# Patient Record
Sex: Female | Born: 1940 | Race: White | Hispanic: No | Marital: Married
Health system: Midwestern US, Academic
[De-identification: ages and names within clinical notes are randomized; demographics above are authoritative.]

---

## 2016-11-23 MED ORDER — ATORVASTATIN 40 MG PO TAB
20 mg | ORAL_TABLET | Freq: Every day | ORAL | 11 refills | Status: DC
Start: 2016-11-23 — End: 2016-11-30

## 2016-11-30 MED ORDER — ATORVASTATIN 20 MG PO TAB
20 mg | ORAL_TABLET | Freq: Every day | ORAL | 3 refills | Status: DC
Start: 2016-11-30 — End: 2017-06-07

## 2016-12-18 MED ORDER — LOSARTAN 25 MG PO TAB
ORAL_TABLET | Freq: Every day | ORAL | 3 refills | 30.00000 days | Status: DC
Start: 2016-12-18 — End: 2017-08-06

## 2017-02-07 ENCOUNTER — Encounter: Admit: 2017-02-07 | Discharge: 2017-02-07 | Payer: MEDICARE

## 2017-02-07 MED ORDER — POTASSIUM CHLORIDE 20 MEQ PO TBTQ
ORAL_TABLET | Freq: Every day | 3 refills | 30.00000 days | Status: AC
Start: 2017-02-07 — End: 2017-10-26

## 2017-05-15 ENCOUNTER — Encounter: Admit: 2017-05-15 | Discharge: 2017-05-15 | Payer: MEDICARE

## 2017-05-15 MED ORDER — METOPROLOL SUCCINATE 25 MG PO TB24
ORAL_TABLET | Freq: Every day | ORAL | 3 refills | 90.00000 days | Status: AC
Start: 2017-05-15 — End: 2017-05-28

## 2017-05-15 MED ORDER — FUROSEMIDE 40 MG PO TAB
ORAL_TABLET | Freq: Every day | ORAL | 3 refills | 90.00000 days | Status: AC
Start: 2017-05-15 — End: 2017-05-28

## 2017-05-25 ENCOUNTER — Encounter: Admit: 2017-05-25 | Discharge: 2017-05-25 | Payer: MEDICARE

## 2017-05-28 MED ORDER — METOPROLOL SUCCINATE 25 MG PO TB24
ORAL_TABLET | Freq: Every day | ORAL | 2 refills | 90.00000 days | Status: AC
Start: 2017-05-28 — End: 2018-02-27

## 2017-05-28 MED ORDER — FUROSEMIDE 40 MG PO TAB
ORAL_TABLET | Freq: Every day | ORAL | 3 refills | 90.00000 days | Status: AC
Start: 2017-05-28 — End: 2018-06-03

## 2017-06-04 LAB — BASIC METABOLIC PANEL
Lab: 0.8 — ABNORMAL LOW (ref 33.0–37.0)
Lab: 10
Lab: 105
Lab: 105
Lab: 14
Lab: 142
Lab: 27
Lab: 30 — ABNORMAL HIGH (ref 9.8–20.1)

## 2017-06-04 LAB — HEMOGLOBIN A1C: Lab: 6.3

## 2017-06-04 LAB — CBC: Lab: 6.8

## 2017-06-07 ENCOUNTER — Ambulatory Visit: Admit: 2017-06-07 | Discharge: 2017-06-08 | Payer: MEDICARE

## 2017-06-07 ENCOUNTER — Encounter: Admit: 2017-06-07 | Discharge: 2017-06-07 | Payer: MEDICARE

## 2017-06-07 DIAGNOSIS — E119 Type 2 diabetes mellitus without complications: ICD-10-CM

## 2017-06-07 DIAGNOSIS — I2510 Atherosclerotic heart disease of native coronary artery without angina pectoris: Principal | ICD-10-CM

## 2017-06-07 DIAGNOSIS — M1990 Unspecified osteoarthritis, unspecified site: ICD-10-CM

## 2017-06-07 DIAGNOSIS — E7800 Pure hypercholesterolemia, unspecified: ICD-10-CM

## 2017-06-07 DIAGNOSIS — H547 Unspecified visual loss: ICD-10-CM

## 2017-06-07 DIAGNOSIS — I10 Essential (primary) hypertension: ICD-10-CM

## 2017-06-07 DIAGNOSIS — K439 Ventral hernia without obstruction or gangrene: ICD-10-CM

## 2017-06-07 DIAGNOSIS — M549 Dorsalgia, unspecified: ICD-10-CM

## 2017-06-07 DIAGNOSIS — E669 Obesity, unspecified: ICD-10-CM

## 2017-06-07 DIAGNOSIS — E785 Hyperlipidemia, unspecified: Secondary | ICD-10-CM

## 2017-06-07 DIAGNOSIS — J439 Emphysema, unspecified: ICD-10-CM

## 2017-06-07 DIAGNOSIS — J45909 Unspecified asthma, uncomplicated: ICD-10-CM

## 2017-06-07 MED ORDER — ATORVASTATIN 40 MG PO TAB
40 mg | ORAL_TABLET | Freq: Every day | ORAL | 3 refills | Status: AC
Start: 2017-06-07 — End: 2017-11-15

## 2017-06-07 NOTE — Progress Notes
Date of Service: 06/07/2017    Amy Bowman is a 76 y.o. female.       HPI     I had the pleasure of seeing Amy Bowman today for a 84-month evaluation of her coronary artery disease post CABG in 1999, hypertension, hyperlipidemia, morbid obesity, and diabetes mellitus type 2 requiring insulin.  She also has severe osteoarthritis and is post prior right shoulder surgery, bilateral hip surgery, and back surgery.  She needs to use a walker or scooter to ambulate due to her significant pain.  She followed up with Dr. Avie Arenas on November 23, 2016 and at that time was not having any cardiovascular complaints.  She had been successful in an 8 pound weight loss.    Amy Bowman presents to the office today telling me that in August she went to the ED due to constipation.  She tells me she did take MiraLAX for a while and her constipation resolved but it has returned.  She just restarted the MiraLAX 2 days ago and it has not yet helped.  She tells me her abdomen is very bloated and she is uncomfortable.  Otherwise she tells me she thinks she is been getting along well.  She has recently been started on oxygen at night.  She tells me now that she uses oxygen at night when she wakes up in the morning she feels refreshed.  She tells me she does snore some at night but does not believe she has any apneic periods and again reports that she wakes up feeling refreshed in the morning.  She does complain of some left upper inner leg pain and burning.  She has an appointment to address this with her PCP on October 22.  She reports occasionally feeling mildly lightheaded.  She does have mild left ankle edema that she reports is chronic ever since a vein was harvested in her left lower leg from her bypass surgery.    Labs drawn at her PCPs office on June 04, 2017 are noted below:  Results for Amy, Bowman (MRN [id number]) as of 06/07/2017 11:11   Ref. Range 06/04/2017 00:00   Hemoglobin Unknown 14.5   Hematocrit Unknown 46.1 Platelet Count Unknown 287   White Blood Cells Unknown 6.8   RBC Unknown 4.74   MCV Unknown 97.1   MCH Unknown 30.6   MCHC Latest Ref Range: 33.0 - 37.0  31.5 (L)   RDW Unknown 14.4   Sodium Unknown 142   Potassium Unknown 4.4   Chloride Unknown 105   CO2 Unknown 27.0   Anion Gap Unknown 14   Blood Urea Nitrogen Latest Ref Range: 9.8 - 20.1  30.0 (H)   Creatinine Unknown 0.85   Glucose Unknown 105   Calcium Unknown 10.0   Hemoglobin A1C Unknown 6.3     Assessment and plan    1.  Coronary artery disease???stable.  She denies having any chest pain.  She continues on aspirin, losartan, and metoprolol.  Her most recent regadenoson MPI from September 2016 revealed a normal EF at 74% and there was no significant ischemia on her study.  Her most recent echocardiogram from September 2016 revealed a normal EF, no valvular stenosis, mild biatrial enlargement, and mild LVH.    2.  Hypertension.  Her blood pressure was elevated at 144/80 and 146/80.  After sitting and resting for 10 minutes her blood pressure was essentially unchanged.  She tells me that she feels very uncomfortable from being constipated.  She has  been encouraged to use the MiraLAX daily and get her constipation resolved.  She will follow-up in our office in 1 week for blood pressure check with the nurse.  Hopefully her blood pressure will be back to normal.  If her blood pressure is greater than 140 systolic or 90 diastolic we will ask her to increase her losartan to 50 mg daily.  We would then check a BMP 3 weeks later and have her follow-up in our office in 1 month for a blood pressure check to make sure it is running well controlled.    3.  Hyperlipidemia. Her LDL is elevated at 94.  Her goal LDL is less than 70.  She is been asked to increase her atorvastatin to 40 mg daily.She has been given a lab order to check a fasting lipid profile, AST, and ALT in 3 months.  She will follow-up in our office with Dr. Avie Arenas 1 week later. 4.  Morbid obesity.  She tells me she is frustrated that she has regained 10 pounds in 6 months.  She tells me she really does not monitor what she eats.  We spent quite a bit of time discussing portion control and cutting back her calories with the goal of weight loss.  Unfortunately her activity is very limited by her significant osteoarthritis and pain.    Thank you for the opportunity to participate in the care of this patient.  Please feel free to call us if you have any questions or concerns    Loraine Leriche, APRN-C  DRB             Vitals:    06/07/17 0948 06/07/17 0959   BP: 144/80 146/80   Pulse: 74    Weight: 115.7 kg (255 lb)    Height: 1.524 m (5')      Body mass index is 49.8 kg/m???.     Past Medical History  Patient Active Problem List    Diagnosis Date Noted   ??? 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 Pre-operative cardiovascular examination 04/22/2015   ??? Preoperative cardiovascular examination 11/26/2014   ??? 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 Obesity 06/02/2009   ??? 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 DJD (degenerative joint disease) 06/02/2009   ??? 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 Abdominal wall hernia 06/02/2009   ??? CAD (coronary artery disease)      CABG 1999 at [facility]:  LIMA > LAD, SVG to OM,  SVG to inferior OM> PDA     ??? Type II diabetes mellitus (HCC)    ??? HLD (hyperlipidemia)    ??? HTN (hypertension)    ??? Near syncope      10/16/2013-[facility] Carotid Artery US-Impression:  Some minimal atherosclerotic plaque evident bilaterally in the carotid bulbs and origin of the internal carotid arteries that does not appear to be hemodynamically significant at this time with findings consistent with less than a 50% diameter stenosis left and right.           Review of Systems   Constitution: Negative.   HENT: Negative.    Eyes: Negative.    Cardiovascular: Negative. Respiratory: Positive for shortness of breath.    Endocrine: Negative.    Hematologic/Lymphatic: Negative.    Skin: Negative.    Musculoskeletal: Positive for muscle cramps and myalgias.   Gastrointestinal: Positive for constipation.  Genitourinary: Negative.    Neurological: Negative.    Psychiatric/Behavioral: Negative.    Allergic/Immunologic: Negative.    She denies having chest pain, palpitations, PND, orthopnea,  dizziness, pre-syncope, syncope, coughing, wheezing,  sputum production, hemoptysis, or edema. She uses no tobacco products.      Physical Exam     General Appearance: resting comfortably, no acute distress, morbidly obese  Skin: warm, moist, no ulcers or xanthomas  Digits and Nails: no clubbing  Eyes: conjunctivae and lids normal, pupils are equal and round  Lips & Oral Mucosa: no pallor or cyanosis  Ear, Nose, Throat: No deformities  Neck Veins: neck veins are flat, neck veins are not distended  Thyroid: no nodules, masses, tenderness or enlargement  Chest Inspection: chest is normal in appearance  Respiratory Effort: breathing is unlabored, no respiratory distress  Auscultation/Percussion: lungs clear to auscultation, no rales, rhonchi, or wheezing  PMI: PMI not enlarged or displaced  Cardiac Rhythm: regular rhythm and normal rate  Cardiac Auscultation: Normal S1 & S2, no S3 or S4, no rub  Murmurs: no cardiac murmurs   Carotid Arteries: normal carotid upstroke bilaterally, no bruits  Pedal Pulses: normal symmetric pedal pulses  Lower Extremity Edema: no lower extremity edema  Abdominal Exam: soft, non-tender, no masses, bowel sounds normal, difficult to assess due to obesity  Abdominal Aorta: difficult to assess due to obesity  Liver & Spleen: difficult to assess due to obesity  Gait & Station: uses a walker due to osteoarthritis, back and hip pain  Muscle Strength: normal strength and tone  Neurologic Exam: neurological assessment grossly intact  Orientation: oriented to time, place and person Affect & Mood: appropriate and sustained affect  Other: moves all extremities            Problems Addressed Today  No diagnosis found.                  Current Medications (including today's revisions)  ??? albuterol (VENTOLIN HFA, PROAIR HFA) 90 mcg/Actuation IN inhaler Inhale 2 Puffs by mouth Every 6 Hours as needed for Wheezing.   ??? aspirin EC 81 mg PO tablet Take 1 Tab by mouth Daily.   ??? atorvastatin (LIPITOR) 20 mg tablet Take 1 tablet by mouth daily.   ??? furosemide (LASIX) 40 mg tablet TAKE 1 TABLET BY MOUTH EVERY DAY   ??? insulin lispro(+) 75/25 (HUMALOG MIX 75/25) 100 unit/mL (75/25) SC Susp Inject  under the skin. Sliding scale   ??? losartan (COZAAR) 25 mg tablet TAKE 1 TABLET BY MOUTH EVERY DAY   ??? metformin (GLUCOPHAGE) 1,000 mg PO tablet Take 1 Tab by mouth Twice Daily With Meals. Resume on Friday evening. 48hrs post procedure   ??? metoprolol XL (TOPROL XL) 25 mg extended release tablet TAKE 1 TABLET BY MOUTH EVERY DAY   ??? naproxen (NAPROSYN) 500 mg PO tablet Take 1 Tab by mouth twice daily.   ??? potassium chloride SR (K-DUR) 20 mEq tablet TAKE 1 TABLET BY MOUTH EVERY DAY   ??? zafirlukast(+) (ACCOLATE) 20 mg PO tablet Take 1 Tab by mouth Twice Daily.

## 2017-06-08 ENCOUNTER — Encounter: Admit: 2017-06-08 | Discharge: 2017-06-08 | Payer: MEDICARE

## 2017-06-08 LAB — HEMOGLOBIN A1C: Lab: 6.3

## 2017-06-08 LAB — CBC
Lab: 14
Lab: 14
Lab: 287
Lab: 30
Lab: 31 — ABNORMAL LOW (ref 33–37)
Lab: 46
Lab: 6.8
Lab: 97

## 2017-06-08 LAB — BASIC METABOLIC PANEL
Lab: 0.8
Lab: 10
Lab: 105
Lab: 105
Lab: 14
Lab: 142
Lab: 27
Lab: 30 — ABNORMAL HIGH (ref 9.8–20.1)
Lab: 4.4

## 2017-06-08 NOTE — Telephone Encounter
Labs entered for review at next OV.

## 2017-06-14 ENCOUNTER — Encounter: Admit: 2017-06-14 | Discharge: 2017-06-14 | Payer: MEDICARE

## 2017-06-14 NOTE — Progress Notes
Patient presents to clinic today for blood pressure check since it was sl elevated at last office visit.  Today bp is L 150/70, R 153/80, pulse is 74.  Pt bought a new [employer] home blood pressure cuff and brought it today to compare.  The new cuff is reading within range of manual reading. Pt is sl hesitant about increasing medications because her bp was normal at Dr. Jadene Pierini office the other day.  I asked her to begin monitoring at home once daily and call if bp is consistently elevated.  I will call next week to obtain bp readings.   Will route to [facility] for review.

## 2017-06-26 ENCOUNTER — Encounter: Admit: 2017-06-26 | Discharge: 2017-06-26 | Payer: MEDICARE

## 2017-06-26 NOTE — Telephone Encounter
Pt called back and left message with her bp readings.  BP stable.     10/30  144/67  10/31  135/66  11/1    153/71  11/2    136/67  11/3    129/62  11/4    139/78  11/5    137/69

## 2017-08-06 ENCOUNTER — Encounter: Admit: 2017-08-06 | Discharge: 2017-08-06 | Payer: MEDICARE

## 2017-08-06 MED ORDER — LOSARTAN 50 MG PO TAB
50 mg | ORAL_TABLET | Freq: Every day | ORAL | 3 refills | 30.00000 days | Status: AC
Start: 2017-08-06 — End: 2017-09-19

## 2017-08-06 NOTE — Progress Notes
Received msg on triage line from pt asking for call back for losartan refill needed. Spoke to pt in f/u and she stated when she saw Collie Siad in Oct she was told to increas losartan if BP remained elevated. Chart review confirms. She spoke to [place], [place] on 11/6 with update on BP and then decided to increase the dose ~ 12/1. She states since that time BP has ranged from 141/73-115/58. Pt is planning to get f/u labs in [place] on 12/27 and will see Dr. Samantha Crimes [facility]) in f/u 1/3. She requests new refill for larger tablet size losartan. e-RX sent as requested. No further needs identified at this time.

## 2017-08-07 ENCOUNTER — Encounter: Admit: 2017-08-07 | Discharge: 2017-08-07 | Payer: MEDICARE

## 2017-08-16 LAB — LIPID PROFILE
Lab: 160
Lab: 33
Lab: 4
Lab: 80

## 2017-08-16 LAB — AST (SGOT): Lab: 53 — ABNORMAL HIGH (ref 5–34)

## 2017-08-16 LAB — ALT (SGPT): Lab: 71 — ABNORMAL HIGH (ref 0–55)

## 2017-08-17 ENCOUNTER — Encounter: Admit: 2017-08-17 | Discharge: 2017-08-17 | Payer: MEDICARE

## 2017-08-17 DIAGNOSIS — E7800 Pure hypercholesterolemia, unspecified: Principal | ICD-10-CM

## 2017-08-23 ENCOUNTER — Ambulatory Visit: Admit: 2017-08-23 | Discharge: 2017-08-24 | Payer: MEDICARE

## 2017-08-23 ENCOUNTER — Encounter: Admit: 2017-08-23 | Discharge: 2017-08-23 | Payer: MEDICARE

## 2017-08-23 DIAGNOSIS — J439 Emphysema, unspecified: ICD-10-CM

## 2017-08-23 DIAGNOSIS — M549 Dorsalgia, unspecified: ICD-10-CM

## 2017-08-23 DIAGNOSIS — J45909 Unspecified asthma, uncomplicated: ICD-10-CM

## 2017-08-23 DIAGNOSIS — E118 Type 2 diabetes mellitus with unspecified complications: ICD-10-CM

## 2017-08-23 DIAGNOSIS — M1990 Unspecified osteoarthritis, unspecified site: ICD-10-CM

## 2017-08-23 DIAGNOSIS — I10 Essential (primary) hypertension: ICD-10-CM

## 2017-08-23 DIAGNOSIS — H547 Unspecified visual loss: ICD-10-CM

## 2017-08-23 DIAGNOSIS — M19011 Primary osteoarthritis, right shoulder: ICD-10-CM

## 2017-08-23 DIAGNOSIS — E119 Type 2 diabetes mellitus without complications: ICD-10-CM

## 2017-08-23 DIAGNOSIS — R55 Syncope and collapse: ICD-10-CM

## 2017-08-23 DIAGNOSIS — Z951 Presence of aortocoronary bypass graft: ICD-10-CM

## 2017-08-23 DIAGNOSIS — I2510 Atherosclerotic heart disease of native coronary artery without angina pectoris: Principal | ICD-10-CM

## 2017-08-23 DIAGNOSIS — M8949 Other hypertrophic osteoarthropathy, multiple sites: ICD-10-CM

## 2017-08-23 DIAGNOSIS — K439 Ventral hernia without obstruction or gangrene: ICD-10-CM

## 2017-08-23 DIAGNOSIS — E785 Hyperlipidemia, unspecified: ICD-10-CM

## 2017-08-23 DIAGNOSIS — E7800 Pure hypercholesterolemia, unspecified: ICD-10-CM

## 2017-08-23 DIAGNOSIS — E669 Obesity, unspecified: ICD-10-CM

## 2017-09-18 ENCOUNTER — Encounter: Admit: 2017-09-18 | Discharge: 2017-09-18 | Payer: MEDICARE

## 2017-09-18 DIAGNOSIS — E7800 Pure hypercholesterolemia, unspecified: Principal | ICD-10-CM

## 2017-09-18 DIAGNOSIS — E118 Type 2 diabetes mellitus with unspecified complications: ICD-10-CM

## 2017-09-19 ENCOUNTER — Encounter: Admit: 2017-09-19 | Discharge: 2017-09-19 | Payer: MEDICARE

## 2017-09-19 DIAGNOSIS — I10 Essential (primary) hypertension: ICD-10-CM

## 2017-09-19 DIAGNOSIS — I2510 Atherosclerotic heart disease of native coronary artery without angina pectoris: Principal | ICD-10-CM

## 2017-09-19 MED ORDER — LOSARTAN 100 MG PO TAB
100 mg | ORAL_TABLET | Freq: Every day | ORAL | 3 refills | 30.00000 days | Status: AC
Start: 2017-09-19 — End: 2018-09-10

## 2017-10-15 LAB — BASIC METABOLIC PANEL
Lab: 0.8 mL/min (ref >60)
Lab: 104 U/L (ref 7–56)
Lab: 13
Lab: 139 U/L (ref 7–40)
Lab: 26 mL/min — ABNORMAL HIGH (ref 9.8–20.1)
Lab: 27 10*3/uL (ref 3–12)
Lab: 4.7 MMOL/L — ABNORMAL HIGH (ref 21–30)
Lab: 9.9 10*3/uL (ref 0–0.20)
Lab: 98 10*3/uL (ref 0–0.45)

## 2017-10-17 ENCOUNTER — Encounter: Admit: 2017-10-17 | Discharge: 2017-10-17 | Payer: MEDICARE

## 2017-10-17 DIAGNOSIS — I10 Essential (primary) hypertension: ICD-10-CM

## 2017-10-17 DIAGNOSIS — I2510 Atherosclerotic heart disease of native coronary artery without angina pectoris: Principal | ICD-10-CM

## 2017-10-18 ENCOUNTER — Encounter: Admit: 2017-10-18 | Discharge: 2017-10-18 | Payer: MEDICARE

## 2017-10-26 ENCOUNTER — Encounter: Admit: 2017-10-26 | Discharge: 2017-10-26 | Payer: MEDICARE

## 2017-10-26 MED ORDER — POTASSIUM CHLORIDE 20 MEQ PO TBTQ
20 meq | ORAL_TABLET | Freq: Every day | ORAL | 3 refills | 30.00000 days | Status: AC
Start: 2017-10-26 — End: 2018-10-01

## 2017-11-13 ENCOUNTER — Encounter: Admit: 2017-11-13 | Discharge: 2017-11-13 | Payer: MEDICARE

## 2017-11-15 ENCOUNTER — Encounter: Admit: 2017-11-15 | Discharge: 2017-11-15 | Payer: MEDICARE

## 2017-11-15 DIAGNOSIS — E7800 Pure hypercholesterolemia, unspecified: Principal | ICD-10-CM

## 2017-11-15 DIAGNOSIS — E785 Hyperlipidemia, unspecified: Principal | ICD-10-CM

## 2017-11-15 DIAGNOSIS — E118 Type 2 diabetes mellitus with unspecified complications: ICD-10-CM

## 2017-11-15 DIAGNOSIS — I2510 Atherosclerotic heart disease of native coronary artery without angina pectoris: ICD-10-CM

## 2017-11-15 LAB — BASIC METABOLIC PANEL
Lab: 0.9
Lab: 9
Lab: 114 — ABNORMAL HIGH (ref 70–105)

## 2017-11-15 LAB — ALT (SGPT): Lab: 87 U/L — ABNORMAL HIGH (ref 0–55)

## 2017-11-15 LAB — AST (SGOT): Lab: 68 10*3/uL — ABNORMAL HIGH (ref 5–34)

## 2017-11-15 MED ORDER — ATORVASTATIN 20 MG PO TAB
20 mg | ORAL_TABLET | Freq: Every day | ORAL | 3 refills | Status: AC
Start: 2017-11-15 — End: 2018-10-01

## 2018-01-01 ENCOUNTER — Encounter: Admit: 2018-01-01 | Discharge: 2018-01-01 | Payer: MEDICARE

## 2018-01-07 ENCOUNTER — Encounter: Admit: 2018-01-07 | Discharge: 2018-01-07 | Payer: MEDICARE

## 2018-01-07 DIAGNOSIS — E785 Hyperlipidemia, unspecified: Principal | ICD-10-CM

## 2018-01-07 DIAGNOSIS — I2510 Atherosclerotic heart disease of native coronary artery without angina pectoris: ICD-10-CM

## 2018-01-07 LAB — AST (SGOT): Lab: 30

## 2018-01-07 LAB — LIPID PROFILE
Lab: 152
Lab: 4
Lab: 82

## 2018-01-07 LAB — ALT (SGPT): Lab: 34

## 2018-02-27 ENCOUNTER — Encounter: Admit: 2018-02-27 | Discharge: 2018-02-27 | Payer: MEDICARE

## 2018-02-27 MED ORDER — METOPROLOL SUCCINATE 25 MG PO TB24
25 mg | ORAL_TABLET | Freq: Every day | ORAL | 2 refills | 90.00000 days | Status: AC
Start: 2018-02-27 — End: 2018-11-12

## 2018-05-16 ENCOUNTER — Ambulatory Visit: Admit: 2018-05-16 | Discharge: 2018-05-17 | Payer: MEDICARE

## 2018-05-16 ENCOUNTER — Encounter: Admit: 2018-05-16 | Discharge: 2018-05-16 | Payer: MEDICARE

## 2018-05-16 DIAGNOSIS — E785 Hyperlipidemia, unspecified: Secondary | ICD-10-CM

## 2018-05-16 DIAGNOSIS — I10 Essential (primary) hypertension: ICD-10-CM

## 2018-05-16 DIAGNOSIS — I2510 Atherosclerotic heart disease of native coronary artery without angina pectoris: Principal | ICD-10-CM

## 2018-05-16 DIAGNOSIS — K439 Ventral hernia without obstruction or gangrene: ICD-10-CM

## 2018-05-16 DIAGNOSIS — M549 Dorsalgia, unspecified: ICD-10-CM

## 2018-05-16 DIAGNOSIS — Z01810 Encounter for preprocedural cardiovascular examination: ICD-10-CM

## 2018-05-16 DIAGNOSIS — M1990 Unspecified osteoarthritis, unspecified site: ICD-10-CM

## 2018-05-16 DIAGNOSIS — E7800 Pure hypercholesterolemia, unspecified: ICD-10-CM

## 2018-05-16 DIAGNOSIS — E119 Type 2 diabetes mellitus without complications: Secondary | ICD-10-CM

## 2018-05-16 DIAGNOSIS — D494 Neoplasm of unspecified behavior of bladder: ICD-10-CM

## 2018-05-16 DIAGNOSIS — E1159 Type 2 diabetes mellitus with other circulatory complications: ICD-10-CM

## 2018-05-16 DIAGNOSIS — M8949 Other hypertrophic osteoarthropathy, multiple sites: ICD-10-CM

## 2018-05-16 DIAGNOSIS — Z951 Presence of aortocoronary bypass graft: ICD-10-CM

## 2018-05-16 DIAGNOSIS — J439 Emphysema, unspecified: ICD-10-CM

## 2018-05-16 DIAGNOSIS — J45909 Unspecified asthma, uncomplicated: ICD-10-CM

## 2018-05-16 DIAGNOSIS — M19011 Primary osteoarthritis, right shoulder: ICD-10-CM

## 2018-05-16 DIAGNOSIS — H547 Unspecified visual loss: ICD-10-CM

## 2018-05-16 DIAGNOSIS — E669 Obesity, unspecified: ICD-10-CM

## 2018-05-22 ENCOUNTER — Encounter: Admit: 2018-05-22 | Discharge: 2018-05-22 | Payer: MEDICARE

## 2018-06-03 ENCOUNTER — Encounter: Admit: 2018-06-03 | Discharge: 2018-06-03 | Payer: MEDICARE

## 2018-06-03 LAB — COMPREHENSIVE METABOLIC PANEL
Lab: 0.4
Lab: 1.2 — ABNORMAL HIGH (ref 0.57–1.11)
Lab: 105
Lab: 121 — ABNORMAL HIGH (ref 83–110)
Lab: 140
Lab: 26
Lab: 26 — ABNORMAL HIGH (ref 80.0–99.0)
Lab: 27
Lab: 3.7
Lab: 32 — ABNORMAL HIGH (ref 9.8–20.1)
Lab: 4.7
Lab: 45 — ABNORMAL LOW (ref >59)
Lab: 7.9
Lab: 79
Lab: 9.8

## 2018-06-03 LAB — CBC: Lab: 6.5

## 2018-06-03 MED ORDER — FUROSEMIDE 40 MG PO TAB
ORAL_TABLET | Freq: Every day | ORAL | 3 refills | 90.00000 days | Status: AC
Start: 2018-06-03 — End: 2018-12-24

## 2018-06-27 LAB — BASIC METABOLIC PANEL
Lab: 0.9
Lab: 13
Lab: 139
Lab: 25
Lab: 30 — ABNORMAL HIGH (ref 9.8–20.1)
Lab: 63
Lab: 9.6
Lab: 90

## 2018-06-27 LAB — THYROID STIMULATING HORMONE-TSH: Lab: 0.8

## 2018-06-27 LAB — HEMOGLOBIN A1C: Lab: 5.9

## 2018-09-10 ENCOUNTER — Encounter: Admit: 2018-09-10 | Discharge: 2018-09-10 | Payer: MEDICARE

## 2018-09-10 MED ORDER — LOSARTAN 100 MG PO TAB
ORAL_TABLET | Freq: Every day | ORAL | 3 refills | 30.00000 days | Status: AC
Start: 2018-09-10 — End: 2018-09-18

## 2018-09-18 ENCOUNTER — Encounter: Admit: 2018-09-18 | Discharge: 2018-09-18 | Payer: MEDICARE

## 2018-09-18 MED ORDER — LOSARTAN 100 MG PO TAB
100 mg | ORAL_TABLET | Freq: Every day | ORAL | 3 refills | 30.00000 days | Status: AC
Start: 2018-09-18 — End: 2019-09-01

## 2018-09-23 ENCOUNTER — Encounter: Admit: 2018-09-23 | Discharge: 2018-09-23 | Payer: MEDICARE

## 2018-10-01 ENCOUNTER — Encounter: Admit: 2018-10-01 | Discharge: 2018-10-01 | Payer: MEDICARE

## 2018-10-01 ENCOUNTER — Ambulatory Visit: Admit: 2018-10-01 | Discharge: 2018-10-01 | Payer: MEDICARE

## 2018-10-01 DIAGNOSIS — E7800 Pure hypercholesterolemia, unspecified: ICD-10-CM

## 2018-10-01 DIAGNOSIS — I2510 Atherosclerotic heart disease of native coronary artery without angina pectoris: Principal | ICD-10-CM

## 2018-10-01 DIAGNOSIS — I10 Essential (primary) hypertension: ICD-10-CM

## 2018-10-01 DIAGNOSIS — J439 Emphysema, unspecified: ICD-10-CM

## 2018-10-01 DIAGNOSIS — D494 Neoplasm of unspecified behavior of bladder: ICD-10-CM

## 2018-10-01 DIAGNOSIS — H547 Unspecified visual loss: ICD-10-CM

## 2018-10-01 DIAGNOSIS — E119 Type 2 diabetes mellitus without complications: Secondary | ICD-10-CM

## 2018-10-01 DIAGNOSIS — E669 Obesity, unspecified: ICD-10-CM

## 2018-10-01 DIAGNOSIS — K439 Ventral hernia without obstruction or gangrene: ICD-10-CM

## 2018-10-01 DIAGNOSIS — M1990 Unspecified osteoarthritis, unspecified site: ICD-10-CM

## 2018-10-01 DIAGNOSIS — J45909 Unspecified asthma, uncomplicated: ICD-10-CM

## 2018-10-01 DIAGNOSIS — M549 Dorsalgia, unspecified: ICD-10-CM

## 2018-10-01 DIAGNOSIS — E785 Hyperlipidemia, unspecified: Secondary | ICD-10-CM

## 2018-10-07 LAB — COMPREHENSIVE METABOLIC PANEL
Lab: 0.5
Lab: 1
Lab: 143 — ABNORMAL HIGH (ref 70–105)
Lab: 27 — ABNORMAL HIGH (ref 9.8–20.1)
Lab: 7.6 — ABNORMAL HIGH (ref 11.5–14.5)
Lab: 9.9

## 2018-11-12 ENCOUNTER — Encounter: Admit: 2018-11-12 | Discharge: 2018-11-12 | Payer: MEDICARE

## 2018-11-12 MED ORDER — METOPROLOL SUCCINATE 25 MG PO TB24
25 mg | ORAL_TABLET | Freq: Every day | ORAL | 3 refills | 90.00000 days | Status: AC
Start: 2018-11-12 — End: 2019-12-04

## 2018-11-12 MED ORDER — POTASSIUM CHLORIDE 20 MEQ PO TBTQ
20 meq | ORAL_TABLET | Freq: Every day | ORAL | 3 refills | 30.00000 days | Status: AC
Start: 2018-11-12 — End: 2019-10-31

## 2018-12-18 ENCOUNTER — Encounter: Admit: 2018-12-18 | Discharge: 2018-12-18 | Payer: MEDICARE

## 2018-12-20 ENCOUNTER — Encounter: Admit: 2018-12-20 | Discharge: 2018-12-20 | Payer: MEDICARE

## 2018-12-23 ENCOUNTER — Encounter: Admit: 2018-12-23 | Discharge: 2018-12-23 | Payer: MEDICARE

## 2018-12-24 ENCOUNTER — Encounter: Admit: 2018-12-24 | Discharge: 2018-12-24 | Payer: MEDICARE

## 2018-12-24 ENCOUNTER — Ambulatory Visit: Admit: 2018-12-24 | Discharge: 2018-12-25 | Payer: MEDICARE

## 2018-12-24 DIAGNOSIS — M549 Dorsalgia, unspecified: ICD-10-CM

## 2018-12-24 DIAGNOSIS — R55 Syncope and collapse: ICD-10-CM

## 2018-12-24 DIAGNOSIS — M19011 Primary osteoarthritis, right shoulder: ICD-10-CM

## 2018-12-24 DIAGNOSIS — K439 Ventral hernia without obstruction or gangrene: ICD-10-CM

## 2018-12-24 DIAGNOSIS — I2510 Atherosclerotic heart disease of native coronary artery without angina pectoris: ICD-10-CM

## 2018-12-24 DIAGNOSIS — M1990 Unspecified osteoarthritis, unspecified site: ICD-10-CM

## 2018-12-24 DIAGNOSIS — J439 Emphysema, unspecified: ICD-10-CM

## 2018-12-24 DIAGNOSIS — H547 Unspecified visual loss: ICD-10-CM

## 2018-12-24 DIAGNOSIS — E669 Obesity, unspecified: ICD-10-CM

## 2018-12-24 DIAGNOSIS — E1159 Type 2 diabetes mellitus with other circulatory complications: ICD-10-CM

## 2018-12-24 DIAGNOSIS — J45909 Unspecified asthma, uncomplicated: ICD-10-CM

## 2018-12-24 DIAGNOSIS — I10 Essential (primary) hypertension: ICD-10-CM

## 2018-12-24 DIAGNOSIS — E119 Type 2 diabetes mellitus without complications: Secondary | ICD-10-CM

## 2018-12-24 DIAGNOSIS — E785 Hyperlipidemia, unspecified: Secondary | ICD-10-CM

## 2018-12-24 DIAGNOSIS — Z951 Presence of aortocoronary bypass graft: ICD-10-CM

## 2018-12-24 DIAGNOSIS — E7800 Pure hypercholesterolemia, unspecified: ICD-10-CM

## 2018-12-24 MED ORDER — FUROSEMIDE 20 MG PO TAB
20 mg | ORAL_TABLET | Freq: Every morning | ORAL | 3 refills | 90.00000 days | Status: DC
Start: 2018-12-24 — End: 2019-12-04

## 2018-12-30 ENCOUNTER — Encounter: Admit: 2018-12-30 | Discharge: 2018-12-30 | Payer: MEDICARE

## 2018-12-30 DIAGNOSIS — I2510 Atherosclerotic heart disease of native coronary artery without angina pectoris: ICD-10-CM

## 2018-12-30 DIAGNOSIS — E1159 Type 2 diabetes mellitus with other circulatory complications: ICD-10-CM

## 2018-12-30 DIAGNOSIS — I10 Essential (primary) hypertension: Principal | ICD-10-CM

## 2018-12-30 LAB — LIPID PROFILE
Lab: 175
Lab: 188 — ABNORMAL HIGH (ref <150)
Lab: 38
Lab: 4
Lab: 42
Lab: 95

## 2018-12-30 LAB — LIVER FUNCTION PANEL
Lab: 0.4
Lab: 0.1

## 2018-12-31 ENCOUNTER — Ambulatory Visit: Admit: 2018-12-31 | Discharge: 2019-01-01 | Payer: MEDICARE

## 2018-12-31 ENCOUNTER — Encounter: Admit: 2018-12-31 | Discharge: 2018-12-31 | Payer: MEDICARE

## 2018-12-31 DIAGNOSIS — E1169 Type 2 diabetes mellitus with other specified complication: ICD-10-CM

## 2018-12-31 DIAGNOSIS — I25119 Atherosclerotic heart disease of native coronary artery with unspecified angina pectoris: ICD-10-CM

## 2018-12-31 DIAGNOSIS — I10 Essential (primary) hypertension: Principal | ICD-10-CM

## 2019-01-01 ENCOUNTER — Ambulatory Visit: Admit: 2019-01-01 | Discharge: 2019-01-02 | Payer: MEDICARE

## 2019-01-01 ENCOUNTER — Encounter: Admit: 2019-01-01 | Discharge: 2019-01-01 | Payer: MEDICARE

## 2019-01-01 DIAGNOSIS — I10 Essential (primary) hypertension: ICD-10-CM

## 2019-01-01 DIAGNOSIS — I2510 Atherosclerotic heart disease of native coronary artery without angina pectoris: Principal | ICD-10-CM

## 2019-01-07 ENCOUNTER — Encounter: Admit: 2019-01-07 | Discharge: 2019-01-07 | Payer: MEDICARE

## 2019-01-16 ENCOUNTER — Encounter: Admit: 2019-01-16 | Discharge: 2019-01-16 | Payer: MEDICARE

## 2019-01-16 ENCOUNTER — Ambulatory Visit: Admit: 2019-01-16 | Discharge: 2019-01-17 | Payer: MEDICARE

## 2019-01-16 DIAGNOSIS — I2510 Atherosclerotic heart disease of native coronary artery without angina pectoris: Principal | ICD-10-CM

## 2019-01-16 DIAGNOSIS — R0789 Other chest pain: ICD-10-CM

## 2019-01-16 DIAGNOSIS — Z1159 Encounter for screening for other viral diseases: ICD-10-CM

## 2019-01-16 DIAGNOSIS — Z01810 Encounter for preprocedural cardiovascular examination: ICD-10-CM

## 2019-01-16 DIAGNOSIS — K439 Ventral hernia without obstruction or gangrene: ICD-10-CM

## 2019-01-16 DIAGNOSIS — E119 Type 2 diabetes mellitus without complications: Secondary | ICD-10-CM

## 2019-01-16 DIAGNOSIS — E1159 Type 2 diabetes mellitus with other circulatory complications: Principal | ICD-10-CM

## 2019-01-16 DIAGNOSIS — I10 Essential (primary) hypertension: ICD-10-CM

## 2019-01-16 DIAGNOSIS — E669 Obesity, unspecified: ICD-10-CM

## 2019-01-16 DIAGNOSIS — J439 Emphysema, unspecified: ICD-10-CM

## 2019-01-16 DIAGNOSIS — J45909 Unspecified asthma, uncomplicated: ICD-10-CM

## 2019-01-16 DIAGNOSIS — M1990 Unspecified osteoarthritis, unspecified site: ICD-10-CM

## 2019-01-16 DIAGNOSIS — E7800 Pure hypercholesterolemia, unspecified: ICD-10-CM

## 2019-01-16 DIAGNOSIS — R9439 Abnormal result of other cardiovascular function study: Principal | ICD-10-CM

## 2019-01-16 DIAGNOSIS — I25119 Atherosclerotic heart disease of native coronary artery with unspecified angina pectoris: Secondary | ICD-10-CM

## 2019-01-16 DIAGNOSIS — H547 Unspecified visual loss: ICD-10-CM

## 2019-01-16 DIAGNOSIS — Z951 Presence of aortocoronary bypass graft: ICD-10-CM

## 2019-01-16 DIAGNOSIS — M549 Dorsalgia, unspecified: ICD-10-CM

## 2019-01-16 DIAGNOSIS — E785 Hyperlipidemia, unspecified: ICD-10-CM

## 2019-01-16 DIAGNOSIS — D494 Neoplasm of unspecified behavior of bladder: ICD-10-CM

## 2019-01-16 LAB — CBC
Lab: 15 — ABNORMAL HIGH (ref 11.5–14.5)
Lab: 259
Lab: 30
Lab: 4.6
Lab: 44
Lab: 96
Lab: 13
Lab: 29
Lab: 6.7

## 2019-01-16 LAB — BASIC METABOLIC PANEL
Lab: 0.8
Lab: 106
Lab: 17
Lab: 5
Lab: 100
Lab: 141
Lab: 27

## 2019-01-16 MED ORDER — NITROGLYCERIN 0.3 MG SL SUBL
.4 mg | SUBLINGUAL | 0 refills | Status: CN | PRN
Start: 2019-01-16 — End: ?

## 2019-01-16 MED ORDER — ACETAMINOPHEN 325 MG PO TAB
650 mg | ORAL | 0 refills | Status: CN | PRN
Start: 2019-01-16 — End: ?

## 2019-01-16 MED ORDER — DOCUSATE SODIUM 100 MG PO CAP
100 mg | Freq: Every day | ORAL | 0 refills | Status: CN | PRN
Start: 2019-01-16 — End: ?

## 2019-01-16 MED ORDER — ALUMINUM-MAGNESIUM HYDROXIDE 200-200 MG/5 ML PO SUSP
30 mL | ORAL | 0 refills | Status: CN | PRN
Start: 2019-01-16 — End: ?

## 2019-01-16 MED ORDER — TEMAZEPAM 7.5 MG PO CAP
15 mg | Freq: Every evening | ORAL | 0 refills | Status: CN | PRN
Start: 2019-01-16 — End: ?

## 2019-01-16 NOTE — Progress Notes
Date of Service: 01/16/2019    Amy Bowman is a 78 y.o. female.       HPI     Amy Bowman is a very pleasant 78 year old female that was last seen in our office on Dec 24, 2018.  Patient is known to have CAD, status post CABG in 1999 (LIMA to LAD, SVG to obtuse marginal branch, another SVG to PDA).  Overall, this patient has a poor functional status due to morbid obesity, her BMI is 43.41, osteoarthritis, low back pain, history of emphysema.    Patient does have a history of bladder cancer and has been seen by Dr. Jesse Bowman years.  She also has a sizable hiatal hernia that causes symptoms of shortness of breath and chest pain and it also anticipated that at some point in time she will undergo surgical correction of this.  In addition patient does have a severe osteoarthritis, in the past she was supposed to undergo left hip surgery, however that procedure has been on hold for now.    Other comorbidities include diabetes mellitus type 2 insulin-dependent, patient also has diabetic neuropathy, hypertension, hyperlipidemia, and chronic hypoxemia (patient does use oxygen at 2 L/min continuously).    She was evaluated with a regadenoson MPI on Jan 01, 2019, the tomographic pattern was abnormal, it did reveal mild ischemia that appeared to be in a ramus intermedius branch and also moderate ischemia in the RCA distribution/distal left circumflex artery, this myocardial territories were viable.  Ejection fraction was normal at 64%.    Due to her morbid obesity and decreased functional status as well as hiatal hernia it is difficult to discern whether this patient truly has symptoms of angina, she she is able to perform only minimal physical activity.  However she does report having chest pain that is in the epigastric area/lower part of the chest, some of the symptoms could be related to her hiatal hernia, but could also be due to underlying known coronary artery disease. The last left heart catheterization was performed in October 2010 by Dr. Bufford Bowman and it did reveal patent grafts, mild to moderate disease of the RCA and otherwise severe underlying CAD.         Vitals:    01/16/19 1058 01/16/19 1109   BP: 118/66 128/76   BP Source: Arm, Left Upper Arm, Right Upper   Pulse: 74    Temp: 36.8 ???C (98.2 ???F)    SpO2: 93%    Weight: 114.8 kg (253 lb)    Height: 1.524 m (5')    PainSc: Four      Body mass index is 49.41 kg/m???.     Past Medical History  Patient Active Problem List    Diagnosis Date Noted   ??? 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   ??? 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 Pre-operative cardiovascular examination 04/22/2015   ??? Preoperative cardiovascular examination 11/26/2014   ??? 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 Obesity 06/02/2009   ??? 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 DJD (degenerative joint disease) 06/02/2009   ??? 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 Abdominal wall hernia 06/02/2009   ??? CAD (coronary artery disease)      CABG 1999 at [facility]:  LIMA > LAD, SVG to OM,  SVG to inferior OM> PDA     ??? Type II diabetes mellitus (HCC)    ???  HLD (hyperlipidemia)    ??? HTN (hypertension)    ??? Near syncope      10/16/2013-[facility] Carotid Artery US-Impression:  Some minimal atherosclerotic plaque evident bilaterally in the carotid bulbs and origin of the internal carotid arteries that does not appear to be hemodynamically significant at this time with findings consistent with less than a 50% diameter stenosis left and right.           Review of Systems   Constitution: Negative.   HENT: Positive for ear pain.    Eyes: Negative.    Cardiovascular: Positive for dyspnea on exertion.   Respiratory: Positive for cough and shortness of breath.    Endocrine: Negative.    Hematologic/Lymphatic: Positive for adenopathy.   Skin: Positive for rash. and body mass index (BMI) of 45.0 to 49.9 in adult [facility])    ??? Abdominal wall hernia    ??? Bladder tumor    ??? Abnormal thallium stress test        Assessment and Plan     In summary: This is a 78 year old white female with known CAD, status post CABG in the past, she has multiple comorbidities that mainly consists of morbid obesity, diabetes mellitus type 2 requiring insulin, probably diabetic neuropathy, hypertension, hyperlipidemia, severe osteoarthritis that will eventually require left hip surgery, fairly recent history of bladder cancer that will probably also require surgical intervention, chronic hypoxemia using oxygen at 3 L/min around-the-clock, and very limited ability to perform physical activity due to all of the above.  In addition patient has been experiencing symptoms of chest pain, the most recent evaluation with the regadenoson thallium was abnormal (results are outlined above)    Plan:    1.  In the light of her symptoms, multiple risk factors and multiple comorbidities as well as known CAD and recent abnormal thallium, this patient will undergo further evaluation with a left heart catheterization.  The procedure will be scheduled with Dr. Myriam Bowman, he was patient's original cardiologist few years ago.  2.  Follow-up in the office in  3 months.           Current Medications (including today's revisions)  ??? acetaminophen (TYLENOL) 500 mg tablet Take 1,000 mg by mouth twice daily as needed. Max of 4,000 mg of acetaminophen in 24 hours.    ??? albuterol (VENTOLIN HFA, PROAIR HFA) 90 mcg/Actuation IN inhaler Inhale 2 puffs by mouth into the lungs four times daily as needed for Wheezing.   ??? amoxicillin/K clavulanate (AUGMENTIN) 500/125 mg tablet Take 1 tablet by mouth twice daily.   ??? aspirin EC 81 mg PO tablet Take 1 Tab by mouth Daily.   ??? atorvastatin (LIPITOR) 40 mg tablet Take 40 mg by mouth daily.   ??? BIOTIN PO Take 1 tablet by mouth daily.

## 2019-01-17 NOTE — Progress Notes
???   gabapentin (NEURONTIN) 300 mg capsule Take 300 mg by mouth daily.     ??? insulin lispro(+) 75/25 (HUMALOG MIX 75/25) 100 unit/mL (75/25) SC Susp Inject  under the skin at bedtime daily. Sliding scale     ??? losartan (COZAAR) 100 mg tablet Take one tablet by mouth daily. 90 tablet 3   ??? metformin (GLUCOPHAGE) 1,000 mg PO tablet Take 1 Tab by mouth Twice Daily With Meals. Resume on Friday evening. 48hrs post procedure     ??? metoprolol XL (TOPROL XL) 25 mg extended release tablet Take one tablet by mouth daily. 90 tablet 3   ??? naproxen (NAPROSYN) 500 mg PO tablet Take 1 tablet by mouth daily.     ??? polyethylene glycol 3350 (MIRALAX) 17 g packet Take 17 g by mouth as Needed.     ??? potassium chloride SR (K-DUR) 20 mEq tablet Take one tablet by mouth daily. Take with a meal and a full glass of water. 90 tablet 3   ??? zafirlukast(+) (ACCOLATE) 20 mg PO tablet Take 1 Tab by mouth Twice Daily.       No facility-administered encounter medications on file as of 01/16/2019.        SPECIAL MEDICATIONS INSTRUCTIONS    01/27/2019  COVID TESTING AT [facility] AT 7:45 AM.  CALL FROM THE PARKING LOT [phone].       Special Medication Instructions: Hold the Following Medications: Yes    Hold Diuretic(s): Hold lasix the morning of your procedure  Hold Insulin (LA): Do not take insulin the morning of your procedure  Hold Other Medication(s): Hold vitamins and supplements the morning of your procedure  Medication Comments: Take aspirin 325 mg (non-coated) the morning of your procedure with a sip of water    Additional Instructions  Take a bath or shower with anti-bacterial soap the evening before, or the morning of the procedure: Verified    Bring photo ID and your health insurance card(s): Verified    Wear comfortable clothes and don't bring valuables, other than photo identification card, with you to the hospital.: Verified    BRING A LIST OF YOUR CURRENT MEDICATONS WITH YOU to the hospital: Verified

## 2019-01-21 ENCOUNTER — Encounter: Admit: 2019-01-21 | Discharge: 2019-01-21

## 2019-01-21 NOTE — Progress Notes
Medicare is listed as patient's primary insurance coverage.  Pre-certification is not required for hospitalizations.

## 2019-01-24 ENCOUNTER — Encounter: Admit: 2019-01-24 | Discharge: 2019-01-24

## 2019-01-24 NOTE — H&P (View-Only)
Patient presents for procedure. Please see most recent H/P below.     Amy Parkinson, APRN-C  Pager [phone]     _____________________________________________________________________________    Office Visit     01/16/2019  [facility]   [place], Elmer Picker, MD   Cardiovascular Disease   Type 2 diabetes mellitus with other circulatory complication, with long-term current use of insulin (HCC) +9 more   Dx   Abnormal Stress Test ; Referred by Hiram Gash, MD   Reason for Visit    Progress Notes   Dorris Fetch, MD (Physician) ??? ??? Cardiovascular Disease ??? ??? 01/16/19 1100 ??? ??? Signed      Date of Service: 01/16/2019  ???  [name] is a 78 y.o. female.     ???  HPI  Marae Cottrell is a very pleasant 78 year old female that was last seen in our office on Dec 24, 2018.  Patient is known to have CAD, status post CABG in 1999 (LIMA to LAD, SVG to obtuse marginal branch, another SVG to PDA).  Overall, this patient has a poor functional status due to morbid obesity, her BMI is 43.41, osteoarthritis, low back pain, history of emphysema.  ???  Patient does have a history of bladder cancer and has been seen by Dr. Jesse Sans years.  She also has a sizable hiatal hernia that causes symptoms of shortness of breath and chest pain and it also anticipated that at some point in time she will undergo surgical correction of this.  In addition patient does have a severe osteoarthritis, in the past she was supposed to undergo left hip surgery, however that procedure has been on hold for now.  ???  Other comorbidities include diabetes mellitus type 2 insulin-dependent, patient also has diabetic neuropathy, hypertension, hyperlipidemia, and chronic hypoxemia (patient does use oxygen at 2 L/min continuously).  ???  She was evaluated with a regadenoson MPI on Jan 01, 2019, the tomographic pattern was abnormal, it did reveal mild ischemia that appeared to be in a ramus intermedius branch and also moderate ischemia in the RCA

## 2019-01-28 ENCOUNTER — Encounter: Admit: 2019-01-28 | Discharge: 2019-01-28

## 2019-01-28 DIAGNOSIS — Z1159 Encounter for screening for other viral diseases: Secondary | ICD-10-CM

## 2019-01-28 LAB — COVID-19 (SARS-COV-2) PCR

## 2019-01-28 NOTE — Progress Notes
Called [employer].  COVID test pending.  Will check back around noon today.

## 2019-01-28 NOTE — Telephone Encounter
Contacted patient and confirmed name and DOB. Patient advised that COVID-19 test results are negative. Advised that patient can continue with the procedure from a COVID-19 testing perspective and should follow pre-procedure instructions. Advised that if they develop any concerning symptoms prior to the procedure to contact their procedure team, specialist, and/or PCP for assistance.      , DNP, RN, CCRN, SCRN, CNRN

## 2019-01-28 NOTE — Progress Notes
Confirmed COVID 19 testing is complete and was neg/not detected per [facility].  Copy of the result will be faxed to our office.

## 2019-01-29 ENCOUNTER — Encounter: Admit: 2019-01-29 | Discharge: 2019-01-29

## 2019-01-29 ENCOUNTER — Ambulatory Visit: Admit: 2019-01-29 | Discharge: 2019-01-29

## 2019-01-29 DIAGNOSIS — E669 Obesity, unspecified: Secondary | ICD-10-CM

## 2019-01-29 DIAGNOSIS — M549 Dorsalgia, unspecified: Secondary | ICD-10-CM

## 2019-01-29 DIAGNOSIS — M1990 Unspecified osteoarthritis, unspecified site: Secondary | ICD-10-CM

## 2019-01-29 DIAGNOSIS — K439 Ventral hernia without obstruction or gangrene: Secondary | ICD-10-CM

## 2019-01-29 DIAGNOSIS — R9439 Abnormal result of other cardiovascular function study: Principal | ICD-10-CM

## 2019-01-29 DIAGNOSIS — I2510 Atherosclerotic heart disease of native coronary artery without angina pectoris: Secondary | ICD-10-CM

## 2019-01-29 DIAGNOSIS — H547 Unspecified visual loss: Secondary | ICD-10-CM

## 2019-01-29 DIAGNOSIS — Z951 Presence of aortocoronary bypass graft: Secondary | ICD-10-CM

## 2019-01-29 DIAGNOSIS — J439 Emphysema, unspecified: Secondary | ICD-10-CM

## 2019-01-29 DIAGNOSIS — J45909 Unspecified asthma, uncomplicated: Secondary | ICD-10-CM

## 2019-01-29 DIAGNOSIS — I10 Essential (primary) hypertension: Secondary | ICD-10-CM

## 2019-01-29 DIAGNOSIS — E119 Type 2 diabetes mellitus without complications: Secondary | ICD-10-CM

## 2019-01-29 DIAGNOSIS — R0789 Other chest pain: Secondary | ICD-10-CM

## 2019-01-29 DIAGNOSIS — E785 Hyperlipidemia, unspecified: Secondary | ICD-10-CM

## 2019-01-29 MED ORDER — FENTANYL CITRATE (PF) 50 MCG/ML IJ SOLN
25 ug | INTRAVENOUS | 0 refills | Status: DC | PRN
Start: 2019-01-29 — End: 2019-01-30

## 2019-01-29 MED ORDER — SODIUM CHLORIDE 0.9 % IV SOLP
500 mL | INTRAVENOUS | 0 refills | Status: CP
Start: 2019-01-29 — End: ?
  Administered 2019-01-29: 12:00:00 500 mL via INTRAVENOUS

## 2019-01-29 MED ORDER — DEXMEDETOMIDINE 4 MCG/ML (INFUSION)(AM)(OR)
0 refills | Status: DC
Start: 2019-01-29 — End: 2019-01-29
  Administered 2019-01-29: 13:00:00 1 ug/kg/h via INTRAVENOUS
  Administered 2019-01-29: 14:00:00 50.000 mL via INTRAVENOUS

## 2019-01-29 MED ORDER — CLOPIDOGREL 75 MG PO TAB
75 mg | Freq: Every day | ORAL | 0 refills | Status: DC
Start: 2019-01-29 — End: 2019-01-30
  Administered 2019-01-30: 12:00:00 75 mg via ORAL

## 2019-01-29 MED ORDER — PROPOFOL INJ 10 MG/ML IV VIAL
0 refills | Status: DC
Start: 2019-01-29 — End: 2019-01-29
  Administered 2019-01-29 (×14): 10 mg via INTRAVENOUS

## 2019-01-29 MED ORDER — SODIUM CHLORIDE 0.9 % IV SOLP
250 mL | INTRAVENOUS | 0 refills | Status: CP
Start: 2019-01-29 — End: ?
  Administered 2019-01-29: 16:00:00 250 mL via INTRAVENOUS

## 2019-01-29 MED ORDER — MIDAZOLAM 1 MG/ML IJ SOLN
INTRAVENOUS | 0 refills | Status: DC
Start: 2019-01-29 — End: 2019-01-29
  Administered 2019-01-29 (×4): 0.5 mg via INTRAVENOUS

## 2019-01-29 MED ORDER — SODIUM CHLORIDE 0.9 % IV SOLP
250 mL | INTRAVENOUS | 0 refills | Status: CP
Start: 2019-01-29 — End: ?
  Administered 2019-01-29: 17:00:00 250 mL via INTRAVENOUS

## 2019-01-29 MED ORDER — ONDANSETRON HCL (PF) 4 MG/2 ML IJ SOLN
4 mg | INTRAVENOUS | 0 refills | Status: DC | PRN
Start: 2019-01-29 — End: 2019-01-30

## 2019-01-29 MED ORDER — SODIUM CHLORIDE 0.9 % IV SOLP
0 refills | Status: DC
Start: 2019-01-29 — End: 2019-01-29
  Administered 2019-01-29: 13:00:00 via INTRAVENOUS

## 2019-01-29 MED ORDER — DIPHENHYDRAMINE HCL 50 MG/ML IJ SOLN
25 mg | INTRAVENOUS | 0 refills | Status: DC | PRN
Start: 2019-01-29 — End: 2019-01-30

## 2019-01-29 MED ORDER — HALOPERIDOL LACTATE 5 MG/ML IJ SOLN
1 mg | Freq: Once | INTRAVENOUS | 0 refills | Status: AC | PRN
Start: 2019-01-29 — End: ?

## 2019-01-29 MED ORDER — HYDROCODONE-ACETAMINOPHEN 5-325 MG PO TAB
1-2 | Freq: Once | ORAL | 0 refills | Status: AC | PRN
Start: 2019-01-29 — End: ?

## 2019-01-29 MED ORDER — ADENOSINE IVPB
.56 mg/kg | Freq: Once | INTRAVENOUS | 0 refills | Status: AC
Start: 2019-01-29 — End: ?

## 2019-01-29 MED ORDER — PHENYLEPHRINE IN 0.9% NACL(PF) 1 MG/10 ML (100 MCG/ML) IV SYRG
0 refills | Status: DC
Start: 2019-01-29 — End: 2019-01-29
  Administered 2019-01-29 (×3): 150 ug via INTRAVENOUS
  Administered 2019-01-29 (×2): 100 ug via INTRAVENOUS
  Administered 2019-01-29 (×3): 150 ug via INTRAVENOUS

## 2019-01-29 MED ORDER — ALUMINUM-MAGNESIUM HYDROXIDE 200-200 MG/5 ML PO SUSP
30 mL | ORAL | 0 refills | Status: DC | PRN
Start: 2019-01-29 — End: 2019-01-30

## 2019-01-29 MED ORDER — METOPROLOL SUCCINATE 25 MG PO TB24
25 mg | Freq: Every day | ORAL | 0 refills | Status: DC
Start: 2019-01-29 — End: 2019-01-30
  Administered 2019-01-30: 12:00:00 25 mg via ORAL

## 2019-01-29 MED ORDER — AMOXICILLIN-POT CLAVULANATE 500-125 MG PO TAB
500 mg | Freq: Two times a day (BID) | ORAL | 0 refills | Status: DC
Start: 2019-01-29 — End: 2019-01-30
  Administered 2019-01-30 (×2): 500 mg via ORAL

## 2019-01-29 MED ORDER — ALBUTEROL SULFATE 90 MCG/ACTUATION IN HFAA
2 | RESPIRATORY_TRACT | 0 refills | Status: DC | PRN
Start: 2019-01-29 — End: 2019-01-30

## 2019-01-29 MED ORDER — EUCALYPTUS-MENTHOL MM LOZG
1 | ORAL | 0 refills | Status: DC | PRN
Start: 2019-01-29 — End: 2019-01-30
  Administered 2019-01-29: 20:00:00 1 via ORAL

## 2019-01-29 MED ORDER — FLUTICASONE PROPIONATE 50 MCG/ACTUATION NA SPSN
1 | Freq: Two times a day (BID) | NASAL | 0 refills | Status: DC
Start: 2019-01-29 — End: 2019-01-30
  Administered 2019-01-30: 02:00:00 1 via NASAL

## 2019-01-29 MED ORDER — ASPIRIN 81 MG PO TBEC
81 mg | Freq: Every day | ORAL | 0 refills | Status: DC
Start: 2019-01-29 — End: 2019-01-30

## 2019-01-29 MED ORDER — INSULIN ASPART 100 UNIT/ML SC FLEXPEN
0-6 [IU] | Freq: Before meals | SUBCUTANEOUS | 0 refills | Status: DC
Start: 2019-01-29 — End: 2019-01-30

## 2019-01-29 MED ORDER — TEMAZEPAM 15 MG PO CAP
15 mg | Freq: Every evening | ORAL | 0 refills | Status: DC | PRN
Start: 2019-01-29 — End: 2019-01-30

## 2019-01-29 MED ORDER — ACETAMINOPHEN 325 MG PO TAB
650 mg | ORAL | 0 refills | Status: DC | PRN
Start: 2019-01-29 — End: 2019-01-30
  Administered 2019-01-30 (×2): 650 mg via ORAL

## 2019-01-29 MED ORDER — SODIUM CITRATE-CITRIC ACID 500-334 MG/5 ML PO SOLN
0 refills | Status: DC
Start: 2019-01-29 — End: 2019-01-29
  Administered 2019-01-29: 16:00:00 15 mL via ORAL

## 2019-01-29 MED ORDER — DIPHENHYDRAMINE HCL 25 MG PO CAP
25 mg | ORAL | 0 refills | Status: DC | PRN
Start: 2019-01-29 — End: 2019-01-30

## 2019-01-29 MED ORDER — DOCUSATE SODIUM 100 MG PO CAP
100 mg | Freq: Every day | ORAL | 0 refills | Status: DC | PRN
Start: 2019-01-29 — End: 2019-01-30

## 2019-01-29 MED ORDER — GABAPENTIN 300 MG PO CAP
300 mg | Freq: Every day | ORAL | 0 refills | Status: DC
Start: 2019-01-29 — End: 2019-01-30
  Administered 2019-01-29 – 2019-01-30 (×2): 300 mg via ORAL

## 2019-01-29 MED ORDER — LOSARTAN 50 MG PO TAB
100 mg | Freq: Every day | ORAL | 0 refills | Status: DC
Start: 2019-01-29 — End: 2019-01-30
  Administered 2019-01-30: 12:00:00 100 mg via ORAL

## 2019-01-29 MED ORDER — NITROGLYCERIN 0.4 MG SL SUBL
.4 mg | SUBLINGUAL | 0 refills | Status: DC | PRN
Start: 2019-01-29 — End: 2019-01-30

## 2019-01-29 MED ORDER — POTASSIUM CHLORIDE 20 MEQ PO TBTQ
20 meq | Freq: Every day | ORAL | 0 refills | Status: DC
Start: 2019-01-29 — End: 2019-01-30
  Administered 2019-01-30: 12:00:00 20 meq via ORAL

## 2019-01-29 MED ORDER — HEPARIN (PORCINE) 1,000 UNIT/ML IJ SOLN
0 refills | Status: DC
Start: 2019-01-29 — End: 2019-01-29
  Administered 2019-01-29: 14:00:00 8000 [IU] via INTRAVENOUS

## 2019-01-29 MED ORDER — ONDANSETRON HCL (PF) 4 MG/2 ML IJ SOLN
0 refills | Status: DC
Start: 2019-01-29 — End: 2019-01-29
  Administered 2019-01-29: 14:00:00 4 mg via INTRAVENOUS

## 2019-01-29 MED ORDER — ASPIRIN 81 MG PO TBEC
81 mg | Freq: Every day | ORAL | 0 refills | Status: DC
Start: 2019-01-29 — End: 2019-01-30
  Administered 2019-01-30: 12:00:00 81 mg via ORAL

## 2019-01-29 MED ORDER — FENTANYL CITRATE (PF) 50 MCG/ML IJ SOLN
0 refills | Status: DC
Start: 2019-01-29 — End: 2019-01-29
  Administered 2019-01-29 (×3): 25 ug via INTRAVENOUS

## 2019-01-29 MED ORDER — ATORVASTATIN 40 MG PO TAB
40 mg | Freq: Every day | ORAL | 0 refills | Status: DC
Start: 2019-01-29 — End: 2019-01-30
  Administered 2019-01-30: 02:00:00 40 mg via ORAL

## 2019-01-29 MED ORDER — FUROSEMIDE 20 MG PO TAB
20 mg | Freq: Every morning | ORAL | 0 refills | Status: DC
Start: 2019-01-29 — End: 2019-01-30
  Administered 2019-01-30: 12:00:00 20 mg via ORAL

## 2019-01-29 NOTE — Discharge Instructions - Pharmacy
HGBA1C 5.9 06/27/2018    and Lipid Profile:   Lab Results   Component Value Date    CHOL 175 12/30/2018    TRIG 188 12/30/2018    HDL 42 12/30/2018    LDL 95 12/30/2018    VLDL 38 12/30/2018     Brief Hospital Course:       Amy Bowman is a 78 year old female with coronary artery disease status post CABG in 1999 with LIMA to LAD, saphenous vein graft to the obtuse marginal branch, and another saphenous vein graft to the PDA.  She is morbidly obese with poor functional status due to her weight, osteoarthritis, emphysema, type 2 diabetes with diabetic nephropathy, hypertension, dyslipidemia, and chronic hypoxemia on 2 L nasal cannula continuously.  She has a large hiatal hernia that causes symptoms of shortness of breath and chest pain and is difficult to discern whether she has symptoms of angina.    She was evaluated with a regadenoson MPI on Jan 01, 2019, the tomographic pattern was abnormal, it did reveal mild ischemia that appeared to be in a ramus intermedius branch and also moderate ischemia in the RCA distribution/distal left circumflex artery, this myocardial territories were viable. ???Ejection fraction was normal at 64%.    She was referred for coronary angiogram for further evaluation.  She was taken to the Cath Lab and via right groin approach underwent cardiac catheterization which revealed severe disease of the mid to distal dominant left circumflex.  In a treated left internal mammary artery to the diagonal graft, with 100% occluded vein graft to the left posterior descending artery.  She had a patent vein graft to the obtuse marginal 1.  She underwent successful percutaneous coronary intervention of the native mid to distal left circumflex with a Xience 3.0 x 28 mm, and Xience Sierra 2.5 x 38 mm drug-eluting stents which were placed in an overlapping fashion and postdilated with a 3.0 mm noncompliant balloon.  Left ventricular end-diastolic pressure was 15 to 20 mmHg. Limiting unhealthy fats and cholesterol is the most important step you can take in reducing your risk for cardiovascular disease.  Unhealthy fats include saturated and trans fats.  Monitor your sodium and cholesterol intake.  Restrict your sodium to 2g (grams) or 2000mg  (milligrams) daily, and your cholesterol to 200mg  daily.    If you have questions regarding your diet at home, you may contact a dietitian at [phone].       Diabetic Diet    You should eat between 1600 and 2000 calories per day.  This is equal to 60g (grams) of carbohydrates per meal, and 30g of carbohydrates for a bedtime snack.    If you have questions about your diet after you go home, you can call a dietitian at [phone].     Report These Signs and Symptoms    Please contact your doctor if you have any of the following symptoms: Chest pain, shortness of breath, lightheadedness, dizziness, near fainting, palpitations, abd pain, back pain, or bleeding.     Risk Reduction Plan    Cardiac Event Personal Risk Factor Reduction Plan  **Take this sheet to your physician to show treatment recommendations**  Amy Bowman  Admission Date: 01/29/2019 LOS: @LOS @       Blood Pressure Risk  Goal: Keep blood pressure below 130/80  Your Numbers:  BP Readings from Last 1 Encounters:  01/29/19 : (!) 84/45     Plan: High blood pressure is the single most important risk  factor for cardiac events because it's the #1 cause of cardiac events.  Take medication as prescribed and monitor your blood pressure.    Abnormal lipids (fats in blood) Risk   Goal: Total Cholesterol: <200  LDL (bad cholesterol): primary prevention <100   LDL (bad cholesterol): secondary prevention < 70  HDL (good cholesterol): >40 for men, >50 for women  Triglycerides: <150  Your Numbers: Cholesterol       Date                     Value               Ref Range           Status                12/30/2018               175                                     Final            ---------- atorvastatin (LIPITOR) 80 mg tablet Take one tablet by mouth daily.  Qty: 90 tablet, Refills: 3    PRESCRIPTION TYPE:  Normal      metFORMIN (GLUCOPHAGE) 1,000 mg tablet Take one tablet by mouth twice daily with meals. Resume on February 02, 2019  Qty: 180 tablet    PRESCRIPTION TYPE:  No Print  Associated Diagnoses: Low back pain; Hx of CABG; CAD (coronary artery disease); Obesity; Pulmonary emphysema (HCC); DJD (degenerative joint disease); Type II diabetes mellitus (HCC); HLD (hyperlipidemia); HTN (hypertension); Abdominal wall hernia          CONTINUE these medications which have NOT CHANGED    Details   acetaminophen (TYLENOL) 500 mg tablet Take 1,000 mg by mouth twice daily as needed. Max of 4,000 mg of acetaminophen in 24 hours.     PRESCRIPTION TYPE:  Historical Med      albuterol (VENTOLIN HFA, PROAIR HFA) 90 mcg/Actuation IN inhaler Inhale 2 puffs by mouth into the lungs four times daily as needed for Wheezing.    PRESCRIPTION TYPE:  Historical Med      aspirin EC 81 mg PO tablet Take 1 Tab by mouth Daily.    PRESCRIPTION TYPE:  Historical Med      BIOTIN PO Take 1 tablet by mouth daily.    PRESCRIPTION TYPE:  Historical Med      fluticasone (FLONASE) 50 mcg/actuation nasal spray Apply 1 spray to each nostril as directed twice daily.  Refills: 11    PRESCRIPTION TYPE:  Historical Med      furosemide (LASIX) 20 mg tablet Take one tablet by mouth every morning. Indications: visible water retention  Qty: 90 tablet, Refills: 3    PRESCRIPTION TYPE:  Normal      gabapentin (NEURONTIN) 300 mg capsule Take 300 mg by mouth daily.    PRESCRIPTION TYPE:  Historical Med      insulin lispro(+) 75/25 (HUMALOG MIX 75/25) 100 unit/mL (75/25) SC Susp Inject  under the skin at bedtime daily. Sliding scale    PRESCRIPTION TYPE:  Historical Med      losartan (COZAAR) 100 mg tablet Take one tablet by mouth daily.  Qty: 90 tablet, Refills: 3    PRESCRIPTION TYPE:  Phone In metoprolol XL (TOPROL XL) 25 mg extended release tablet Take one  tablet by mouth daily.  Qty: 90 tablet, Refills: 3    PRESCRIPTION TYPE:  Normal      naproxen (NAPROSYN) 500 mg PO tablet Take 1 tablet by mouth daily.    PRESCRIPTION TYPE:  Historical Med      polyethylene glycol 3350 (MIRALAX) 17 g packet Take 17 g by mouth as Needed.    PRESCRIPTION TYPE:  Historical Med      potassium chloride SR (K-DUR) 20 mEq tablet Take one tablet by mouth daily. Take with a meal and a full glass of water.  Qty: 90 tablet, Refills: 3    PRESCRIPTION TYPE:  Normal      zafirlukast(+) (ACCOLATE) 20 mg PO tablet Take 1 Tab by mouth Twice Daily.    PRESCRIPTION TYPE:  Historical Med           Scheduled appointments:    Mar 13, 2019  2:00 PM CDT  Return Patient with Dorris Fetch, MD  [facility] (CVM Exam) [address]  [place]  [place] [address]  [phone]   Apr 10, 2019 11:00 AM CDT  Return Patient with Dorris Fetch, MD  [facility] (CVM Exam) [address]  [address]  [place] [address]  [phone]           Signed:  Sharlynn Oliphant, APRN-NP  01/30/2019      cc:  Primary Care Physician:  Wilford Grist   Verified  Referring physicians:  Wilford Grist, MD   Additional provider(s):

## 2019-01-29 NOTE — Progress Notes
Pt signed out at this time from anesthesia by Dr. Orlan Leavens, MD.

## 2019-01-29 NOTE — Anesthesia Post-Procedure Evaluation
Post-Anesthesia Evaluation    Name: Amy Bowman      MRN: [id number]     DOB: 10/18/1940     Age: 78 y.o.     Sex: female   __________________________________________________________________________     Procedure Date: 01/29/2019  Procedure(s):  ANGIOGRAPHY CORONARY ARTERY WITH LEFT HEART CATHETERIZATION  POSSIBLE PERCUTANEOUS CORONARY STENT PLACEMENT WITH ANGIOPLASTY      Surgeon: Surgeon(s):  Tarri Abernethy, MD    Post-Anesthesia Vitals  BP: 111/94 (06/10 1300)  Pulse: 58 (06/10 1300)  Respirations: 13 PER MINUTE (06/10 1300)  SpO2: 96 % (06/10 1300)  SpO2 Pulse: 58 (06/10 1300)   Vitals Value Taken Time   BP 111/94 01/29/2019  1:00 PM   Temp 36.4 C (97.5 F) 01/29/2019 10:24 AM   Pulse 58 01/29/2019  1:00 PM   Respirations 13 PER MINUTE 01/29/2019  1:00 PM   SpO2 96 % 01/29/2019  1:00 PM         Post Anesthesia Evaluation Note    Evaluation location: Pre/Post  Patient participation: recovered; patient participated in evaluation  Level of consciousness: alert  Pain management: adequate    Hydration: normovolemia  Temperature: 36.0C - 38.4C  Airway patency: adequate    Perioperative Events       Post-op nausea and vomiting: no PONV    Postoperative Status  Cardiovascular status: hemodynamically stable  Respiratory status: spontaneous ventilation and supplemental oxygen (Pt is on home O2)  Follow-up needed: none  Additional comments: Pt with hypotension and prolonged sedation following procedure. She did not require phenylephrine gtt but had low BP for approximately 3 hours after stopping the Precedex and was drowsy throughout that time. Now pt is wide awake and back to baseline BP. Suspect soft BP/prolonged sedation both related to Precedex infusion intraoperatively.        Perioperative Events  Perioperative Event: No  Emergency Case Activation: No

## 2019-01-29 NOTE — Anesthesia Pre-Procedure Evaluation
Anesthesia Pre-Procedure Evaluation    Name: Amy Bowman      MRN: [id number]     DOB: 03-22-1941     Age: 78 y.o.     Sex: female   _________________________________________________________________________     Procedure Info:   Procedure Information     Date/Time:  01/29/19 0815    Procedures:       ANGIOGRAPHY CORONARY ARTERY WITH LEFT HEART CATHETERIZATION (N/A )      POSSIBLE PERCUTANEOUS CORONARY STENT PLACEMENT WITH ANGIOPLASTY (N/A )    Location:  [facility] / Cath Lab    Provider:  Myriam Jacobson, MD          Physical Assessment  Vital Signs (last filed in past 24 hours):  BP: 107/67 (06/10 0714)  Temp: 36.7 ???C (98.1 ???F) (06/10 4540)  Pulse: 79 (06/10 0714)  Respirations: 25 PER MINUTE (06/10 0714)  SpO2: 98 % (06/10 0714)  Height: 152.4 cm (60) (06/10 0714)  Weight: 112.5 kg (248 lb 0.3 oz) (06/10 9811)      Patient History   Allergies   Allergen Reactions   ??? Celebrex [Celecoxib] RASH   ??? Vioxx [Rofecoxib] RASH        Current Medications    Medication Directions   acetaminophen (TYLENOL) 500 mg tablet Take 1,000 mg by mouth twice daily as needed. Max of 4,000 mg of acetaminophen in 24 hours.    albuterol (VENTOLIN HFA, PROAIR HFA) 90 mcg/Actuation IN inhaler Inhale 2 puffs by mouth into the lungs four times daily as needed for Wheezing.   amoxicillin/K clavulanate (AUGMENTIN) 500/125 mg tablet Take 1 tablet by mouth twice daily.   aspirin EC 81 mg PO tablet Take 1 Tab by mouth Daily.   atorvastatin (LIPITOR) 40 mg tablet Take 40 mg by mouth daily.   BIOTIN PO Take 1 tablet by mouth daily.   fluticasone (FLONASE) 50 mcg/actuation nasal spray Apply 1 spray to each nostril as directed twice daily.   furosemide (LASIX) 20 mg tablet Take one tablet by mouth every morning. Indications: visible water retention   gabapentin (NEURONTIN) 300 mg capsule Take 300 mg by mouth daily.   insulin lispro(+) 75/25 (HUMALOG MIX 75/25) 100 unit/mL (75/25) SC Susp Inject  under the skin at bedtime daily. Sliding scale losartan (COZAAR) 100 mg tablet Take one tablet by mouth daily.   metformin (GLUCOPHAGE) 1,000 mg PO tablet Take 1 Tab by mouth Twice Daily With Meals. Resume on Friday evening. 48hrs post procedure   metoprolol XL (TOPROL XL) 25 mg extended release tablet Take one tablet by mouth daily.   naproxen (NAPROSYN) 500 mg PO tablet Take 1 tablet by mouth daily.   polyethylene glycol 3350 (MIRALAX) 17 g packet Take 17 g by mouth as Needed.   potassium chloride SR (K-DUR) 20 mEq tablet Take one tablet by mouth daily. Take with a meal and a full glass of water.   zafirlukast(+) (ACCOLATE) 20 mg PO tablet Take 1 Tab by mouth Twice Daily.         Review of Systems/Medical History      Patient summary reviewed  Nursing notes reviewed    PONV Screening: Female gender and Non-smoker  No history of anesthetic complications        Pulmonary           Asthma    COPD (ephysema)      Home oxygen use (2 L continuous )      Uses albuterol inhaler BID  Cardiovascular       Recent diagnostic studies:          echocardiogram and stress test          12/31/2018: TTE :Interpretation Summary     ??? Preserved/hyperdynamic left ventricular systolic function with an ejection fraction of greater than 65%.  Mild LV thickness.  ??? Normal right ventricular size.  Per TAPSE/S' mild RV systolic dysfunction.  However grossly, the RV appears to be preserved in terms systolic function.  ??? Normal diastolic function.  ??? No regional wall motion abnormality.  ??? No chamber enlargement.  ??? Mild to moderate aortic insufficiency.  Aortic valve morphology cannot be adequately assessed on the current study.  Mild tricuspid regurgitation.  Mild pulmonic insufficiency.  ??? No pericardial effusion.  ??? Estimated central venous pressure 0 to 5 mmHg.  Estimated peak PA systolic pressure cannot be estimated given adequate TR jet.  ??? The visualized portions of the aortic root and ascending thoracic aorta are within normal limits.  ??? Compared to a prior 2016 transthoracic echocardiogram, the left ventricular ejection fraction appears preserved.  There is interval development of mild to moderate aortic insufficiency.  There are no other clinically significant findings.    Regadenoson 01/01/2019 :Regional Wall Thickening and Motion Post Stress: Abnormal septal motion is present but thickening is maintained.  ???  Left Ventricular Ejection Fraction (post stress, in the resting state) =??? 64 %.   ???  Left Ventricular End Diastolic Volume: 74 mL  ???  SUMMARY/OPINION:?????? This study is abnormal with mild ischemia likely in a ramus intermedius territory and also more moderate ischemia in the dominant right coronary artery territory or distal left circumflex.  The defects retain viability.  Other high risk indicators are not noted.       Exercise tolerance: <4 METS       Beta Blocker therapy: Yes      Beta blockers within 24 hours: Yes      Hypertension,       Coronary artery disease      Coronary artery bypass graft (1999)      Hyperlipidemia          Musculoskeletal         Back pain      Arthritis (hip pain)      Endocrine/Other       Diabetes (uses sliding scale only, at evening if blood sugar is high); using insulin      Obesity (BMI 43)   Physical Exam    Airway Findings      Mallampati: III      TM distance: >3 FB      Neck ROM: full      Mouth opening: good      Airway patency: adequate    Dental Findings:       Partials          Cardiovascular Findings:       Rhythm: regular      Pulmonary Findings:         Comments: Patient states breathing is at baseline today      Abdominal Findings:       Obese    Neurological Findings:       Alert and oriented x 3    Constitutional findings:       No acute distress      Well-developed      Well-nourished       Diagnostic Tests  Hematology:   Lab Results  Component Value Date    HGB 13.3 01/16/2019    HCT 44.2 01/16/2019    PLTCT 259 01/16/2019    WBC 6.7 01/16/2019    NEUT 54 06/02/2009    ANC 2.61 06/02/2009 ALC 1.38 06/02/2009    MONA 10 06/02/2009    AMC 0.48 06/02/2009    EOSA 6 06/02/2009    ABC 0.03 06/02/2009    MCV 96.1 01/16/2019    MCH 29.0 01/16/2019    MCHC 30.2 01/16/2019    MPV 9.0 06/02/2009    RDW 15.8 01/16/2019         General Chemistry:   Lab Results   Component Value Date    NA 141 01/16/2019    K 5.0 01/16/2019    CL 106 01/16/2019    CO2 27.0 01/16/2019    GAP 13 01/16/2019    BUN 17.0 01/16/2019    CR 0.87 01/16/2019    GLU 100 01/16/2019    CA 9.7 01/16/2019    ALBUMIN 3.5 12/30/2018    TOTBILI 0.41 12/30/2018      Coagulation:   Lab Results   Component Value Date    INR 1.2 06/02/2009         Anesthesia Plan    ASA score: 3   Plan: MAC  Induction method: intravenous  NPO status: acceptable      Informed Consent  Anesthetic plan and risks discussed with patient.  Use of blood products discussed with patient  Blood Consent: consented      Plan discussed with: CRNA and anesthesiologist.

## 2019-01-30 ENCOUNTER — Ambulatory Visit: Admit: 2019-01-29 | Discharge: 2019-01-29

## 2019-01-30 ENCOUNTER — Encounter: Admit: 2019-01-30 | Discharge: 2019-01-30

## 2019-01-30 ENCOUNTER — Encounter: Admit: 2019-01-29 | Discharge: 2019-01-30 | Payer: MEDICARE

## 2019-01-30 DIAGNOSIS — E785 Hyperlipidemia, unspecified: Secondary | ICD-10-CM

## 2019-01-30 DIAGNOSIS — Z951 Presence of aortocoronary bypass graft: Secondary | ICD-10-CM

## 2019-01-30 DIAGNOSIS — E669 Obesity, unspecified: Secondary | ICD-10-CM

## 2019-01-30 DIAGNOSIS — K439 Ventral hernia without obstruction or gangrene: Secondary | ICD-10-CM

## 2019-01-30 DIAGNOSIS — J439 Emphysema, unspecified: ICD-10-CM

## 2019-01-30 DIAGNOSIS — M1990 Unspecified osteoarthritis, unspecified site: Secondary | ICD-10-CM

## 2019-01-30 DIAGNOSIS — I2510 Atherosclerotic heart disease of native coronary artery without angina pectoris: Secondary | ICD-10-CM

## 2019-01-30 DIAGNOSIS — H547 Unspecified visual loss: Secondary | ICD-10-CM

## 2019-01-30 DIAGNOSIS — M545 Low back pain: Secondary | ICD-10-CM

## 2019-01-30 DIAGNOSIS — J45909 Unspecified asthma, uncomplicated: Secondary | ICD-10-CM

## 2019-01-30 DIAGNOSIS — E119 Type 2 diabetes mellitus without complications: Secondary | ICD-10-CM

## 2019-01-30 DIAGNOSIS — I10 Essential (primary) hypertension: Secondary | ICD-10-CM

## 2019-01-30 DIAGNOSIS — R9439 Abnormal result of other cardiovascular function study: Principal | ICD-10-CM

## 2019-01-30 DIAGNOSIS — I25119 Atherosclerotic heart disease of native coronary artery with unspecified angina pectoris: Secondary | ICD-10-CM

## 2019-01-30 DIAGNOSIS — R413 Other amnesia: Secondary | ICD-10-CM

## 2019-01-30 DIAGNOSIS — M549 Dorsalgia, unspecified: Secondary | ICD-10-CM

## 2019-01-30 MED ORDER — ATORVASTATIN 80 MG PO TAB
80 mg | ORAL_TABLET | Freq: Every day | ORAL | 3 refills | Status: DC
Start: 2019-01-30 — End: 2019-03-13

## 2019-01-30 MED ORDER — METFORMIN 1,000 MG PO TAB
1000 mg | ORAL_TABLET | Freq: Two times a day (BID) | ORAL | 0 refills | Status: AC
Start: 2019-01-30 — End: ?

## 2019-01-30 MED ORDER — CLOPIDOGREL 75 MG PO TAB
75 mg | ORAL_TABLET | Freq: Every day | ORAL | 3 refills | 90.00000 days | Status: DC
Start: 2019-01-30 — End: 2020-01-27

## 2019-01-30 NOTE — Progress Notes
Patient is declining Outpatient Cardiac Rehabilitation at this time.  She stated that she will be having hip surgery soon and has much trouble getting around.  She also does not drive.  I told her that when she completes her surgery to give [place] a call and we will see if she still qualified for it.  I provided our contact phone number.

## 2019-01-30 NOTE — Patient Instructions
When to call the Doctor: Complications are rare, but can happen.  ??? If you have significant bleeding (more than a teaspoon) or a lump underneath the skin (bigger than a golf ball) at the site lie down, apply firm pressure at the site and call 911. Bleeding from a large vessel needs professional help.  ??? If you have signs of infection at the site such as: redness, warm to touch, drainage, increasing soreness, a fever (100 degrees or more) and/or chills.  ??? Soreness that continues more than a week or unusual pain at the puncture site.  ??? Numbness, tingling, weakness in the affected leg.  ??? If your leg becomes cold and pale.  ??? If you have changes of vision, slurred speech or one-sided weakness.???  ??? If you have prolonged skin redness (similar to a sunburn) and/or itching around chest or back.???  ??? If you have blistering around chest or back.  Who do I contact if I need to speak with someone?  During Business Hours:  ??? [place] [facility] Office at the [facility]: [phone] [facility])  ??? [place]: [phone] (Monday-Friday)  ??? [place]: [phone] (Monday-Friday)  ??? [place]: [phone] (Tuesday and Thursday)  ??? [place]: [phone] (Monday-Friday)  ??? [facility]: [phone] (Tuesday, Wednesday and Friday)  ??? [place]: [phone] (Tuesday, Wednesday and Friday)  Nights and Weekends  ??? [place] [facility] Office at the [facility]: [phone]???  This education is meant to serve as a resource to you and your family. It is not meant to be all inclusive. The members of the [facility] at [facility], [phone], will be glad to answer any questions you may have about this booklet or your procedure.

## 2019-01-30 NOTE — Progress Notes
Amy Bowman discharged on 01/30/2019  .  Patient discharged to home with all belongings.  Discharge instructions, med reconciliation and home wound care instructions given and explained to patient both verbally and written.  Accompanied by staff.  No complaints of uncontrolled pain.  Right groin site remains clean, dry, and intact with no evidence of a hematoma or bleeding after ambulation.  Patient escorted to lobby via personal scooter chair with husband.  Patient to follow up with [facility] [place] [facility]) or on-call physician with any additional questions or concerns.  All contact numbers provided.  Patient acceptant of DC instuctions and report understanding to all information.    IV catheter removed, tip intact    Ala Bent, RN

## 2019-03-13 ENCOUNTER — Encounter: Admit: 2019-03-13 | Discharge: 2019-03-13

## 2019-03-13 ENCOUNTER — Ambulatory Visit: Admit: 2019-03-13 | Discharge: 2019-03-14

## 2019-03-13 DIAGNOSIS — E669 Obesity, unspecified: Secondary | ICD-10-CM

## 2019-03-13 DIAGNOSIS — R9439 Abnormal result of other cardiovascular function study: Secondary | ICD-10-CM

## 2019-03-13 DIAGNOSIS — K439 Ventral hernia without obstruction or gangrene: Secondary | ICD-10-CM

## 2019-03-13 DIAGNOSIS — Z955 Presence of coronary angioplasty implant and graft: Secondary | ICD-10-CM

## 2019-03-13 DIAGNOSIS — H547 Unspecified visual loss: Secondary | ICD-10-CM

## 2019-03-13 DIAGNOSIS — Z01810 Encounter for preprocedural cardiovascular examination: Secondary | ICD-10-CM

## 2019-03-13 DIAGNOSIS — Z09 Encounter for follow-up examination after completed treatment for conditions other than malignant neoplasm: Secondary | ICD-10-CM

## 2019-03-13 DIAGNOSIS — E785 Hyperlipidemia, unspecified: Secondary | ICD-10-CM

## 2019-03-13 DIAGNOSIS — I25119 Atherosclerotic heart disease of native coronary artery with unspecified angina pectoris: Secondary | ICD-10-CM

## 2019-03-13 DIAGNOSIS — M1990 Unspecified osteoarthritis, unspecified site: Secondary | ICD-10-CM

## 2019-03-13 DIAGNOSIS — E1159 Type 2 diabetes mellitus with other circulatory complications: Secondary | ICD-10-CM

## 2019-03-13 DIAGNOSIS — I10 Essential (primary) hypertension: Secondary | ICD-10-CM

## 2019-03-13 DIAGNOSIS — E119 Type 2 diabetes mellitus without complications: Secondary | ICD-10-CM

## 2019-03-13 DIAGNOSIS — Z951 Presence of aortocoronary bypass graft: Secondary | ICD-10-CM

## 2019-03-13 DIAGNOSIS — R55 Syncope and collapse: Secondary | ICD-10-CM

## 2019-03-13 DIAGNOSIS — R413 Other amnesia: Secondary | ICD-10-CM

## 2019-03-13 DIAGNOSIS — J45909 Unspecified asthma, uncomplicated: Secondary | ICD-10-CM

## 2019-03-13 DIAGNOSIS — D494 Neoplasm of unspecified behavior of bladder: Secondary | ICD-10-CM

## 2019-03-13 DIAGNOSIS — M549 Dorsalgia, unspecified: Secondary | ICD-10-CM

## 2019-03-13 DIAGNOSIS — I2510 Atherosclerotic heart disease of native coronary artery without angina pectoris: Secondary | ICD-10-CM

## 2019-03-13 DIAGNOSIS — Z993 Dependence on wheelchair: Secondary | ICD-10-CM

## 2019-03-13 DIAGNOSIS — J439 Emphysema, unspecified: Secondary | ICD-10-CM

## 2019-03-13 DIAGNOSIS — E782 Mixed hyperlipidemia: Secondary | ICD-10-CM

## 2019-03-13 MED ORDER — ATORVASTATIN 40 MG PO TAB
40 mg | ORAL_TABLET | Freq: Every day | ORAL | 3 refills | Status: DC
Start: 2019-03-13 — End: 2020-03-03

## 2019-03-13 NOTE — Progress Notes
Date of Service: 03/13/2019    Amy Bowman is a 78 y.o. female.       HPI     Patient is a 78 year old female that has a history of coronary artery disease and she underwent previous surgical revascularization in 1999 (LIMA to LAD, SVG to OM branch, SVG to PDA).  She also has a history of bladder cancer, she has a large hiatal hernia, she was morbidly obese, hypertensive, diabetes mellitus type 2 requiring insulin, patient's mobility is greatly reduced and she gets around by using a motorized wheelchair.    Patient did experience episodes of chest pain that were difficult to quantify due to patient's lack of mobility.  She was getting evaluated to undergo left hip surgery.    She was evaluated with a perfusion imaging study on 01/01/2019 and the tomographic pattern was abnormal, it showed mild ischemia in a ramus intermedius and also in the RCA distribution, the left ventricular systolic function was normal.    Patient did undergo a left heart catheterization on 01/29/2019, this resulted in PCI of the mid to distal circumflex artery with 2 drug-eluting stents, also FFR evaluation of the left main (she was found to have an atretic LIMA to the diagonal graft and 100% occluded vein graft to left PDA, patent SVG to OM1).    She is currently doing well, patient does not report recurrent symptoms of chest pain.  She did manage to lose approximately 11 pounds since May 2020.  She has been working on her diet.    Patient does report having episodes of diarrhea and these are probably to atorvastatin that was increased from 40 mg p.o. daily to 80 mg p.o. daily upon hospital discharge.         Vitals:    03/13/19 1356   BP: 112/62   BP Source: Arm, Left Upper   Pulse: 77   Temp: 36.7 ???C (98.1 ???F)   SpO2: 95%   Weight: 110.7 kg (244 lb)   Height: 1.524 m (5')   PainSc: Zero     Body mass index is 47.65 kg/m???.     Past Medical History  Patient Active Problem List    Diagnosis Date Noted ??? Coronary artery disease due to lipid rich plaque 01/29/2019   ??? Abnormal thallium stress test 01/16/2019   ??? 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   ??? 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 Pre-operative cardiovascular examination 04/22/2015   ??? Preoperative cardiovascular examination 11/26/2014   ??? 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 Obesity 06/02/2009   ??? 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 DJD (degenerative joint disease) 06/02/2009   ??? 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 Abdominal wall hernia 06/02/2009   ??? CAD (coronary artery disease)      CABG 1999 at [facility]:  LIMA > LAD, SVG to OM,  SVG to inferior OM> PDA  01/29/2019 cath   FINAL IMPRESSION:    1. Severe disease of the mid to distal dominant left circumflex.  2. Atretic left internal mammary artery to the diagonal graft, 100% occluded vein graft to the left posterior descending artery.  3. Patent vein graft to obtuse marginal 1.  4. Successful percutaneous coronary intervention of the native mid to distal left circumflex with Xience 3.0 x 28 mm and Xience Sierra 2.5 x 38 mm drug-eluting stent placed in an  overlapping fashion, postdilated with 3.0 mm noncompliant balloon.  5. High normal left ventricular end-diastolic pressure.     ??? Type II diabetes mellitus (HCC)    ??? HLD (hyperlipidemia)    ??? HTN (hypertension)    ??? Near syncope      10/16/2013-[facility] Carotid Artery US-Impression:  Some minimal atherosclerotic plaque evident bilaterally in the carotid bulbs and origin of the internal carotid arteries that does not appear to be hemodynamically significant at this time with findings consistent with less than a 50% diameter stenosis left and right.           Review of Systems   Constitution: Negative.   HENT: Negative.    Eyes: Negative.    Cardiovascular: Negative.    Respiratory: Negative.    Endocrine: Negative.    Hematologic/Lymphatic: Negative. Skin: Negative.    Musculoskeletal: Negative.    Gastrointestinal: Positive for diarrhea.   Genitourinary: Negative.    Neurological: Negative.    Psychiatric/Behavioral: Negative.    Allergic/Immunologic: Negative.        Physical Exam  General Appearance: Patient is obese, she is in a motorized wheelchair.  Skin: warm, moist, no ulcers or xanthomas  Eyes: conjunctivae and lids normal, pupils are equal and round  Lips & Oral Mucosa: no pallor or cyanosis  Neck Veins: neck veins are flat, neck veins are not distended  Chest Inspection: chest is normal in appearance  Respiratory Effort: breathing comfortably, no respiratory distress  Auscultation/Percussion: lungs clear to auscultation, no rales or rhonchi, no wheezing  Cardiac Rhythm: regular rhythm and normal rate  Cardiac Auscultation: S1, S2 normal, no rub, no gallop  Murmurs: no murmur  Carotid Arteries: normal carotid upstroke bilaterally, no bruit  Lower Extremity Edema: no lower extremity edema  Abdominal Exam: soft, non-tender, no masses, bowel sounds normal  Liver & Spleen: no organomegaly  Language and Memory: patient responsive and seems to comprehend information  Neurologic Exam: neurological assessment grossly intact      Cardiovascular Studies      Problems Addressed Today  Encounter Diagnoses   Name Primary?   ??? Near syncope Yes   ??? Hx of CABG    ??? Essential hypertension    ??? Coronary artery disease due to lipid rich plaque    ??? Mixed hyperlipidemia    ??? Coronary artery disease involving native coronary artery of native heart with angina pectoris (HCC)    ??? Type 2 diabetes mellitus with other circulatory complication, with long-term current use of insulin (HCC)    ??? Preoperative cardiovascular examination    ??? Abnormal thallium stress test    ??? Bladder tumor    ??? Class 3 severe obesity due to excess calories with serious comorbidity and body mass index (BMI) of 45.0 to 49.9 in adult [facility])    ??? H/O right coronary artery stent placement ??? Able to mobilize using indoor motorized wheelchair        Assessment and Plan     In summary: Patient is a 78 year old white female with a known history of CAD, she is status post previous surgical revascularization, patient does have a history of chest pain and abnormal stress test performed in May 2020, she underwent a left heart catheterization in June 2020 and this resulted in PCI of the mid to distal left circumflex artery with 2 drug-eluting stents, patient was found to have severe disease of the mid to distal dominant left circumflex artery, atretic LIMA to the diagonal graft and 100% occluded  SVG to PDA, patent SVG to OM1.    Patient did develop diarrhea which is probably to the statin therapy that was increased upon hospital discharge.    Otherwise, from a cardiac standpoint she has remained stable.    Plan:    1.  Decrease atorvastatin to 40 mg p.o. daily  2.  Continue all other medications including the dual antiplatelet therapy, I did emphasize to the patient that it is very important to be compliant with her medications  3.  We discussed again about risk factors modification and weight reduction, I did recommend the weight watchers diet.  4.  Follow-up office visit in 3 months.         Current Medications (including today's revisions)  ??? acetaminophen (TYLENOL) 500 mg tablet Take 1,000 mg by mouth twice daily as needed. Max of 4,000 mg of acetaminophen in 24 hours.    ??? albuterol (VENTOLIN HFA, PROAIR HFA) 90 mcg/Actuation IN inhaler Inhale 2 puffs by mouth into the lungs four times daily as needed for Wheezing.   ??? aspirin EC 81 mg PO tablet Take 1 Tab by mouth Daily.   ??? atorvastatin (LIPITOR) 80 mg tablet Take one tablet by mouth daily.   ??? BIOTIN PO Take 1 tablet by mouth daily.   ??? clopiDOGrel (PLAVIX) 75 mg tablet Take one tablet by mouth daily.   ??? fluticasone (FLONASE) 50 mcg/actuation nasal spray Apply 1 spray to each nostril as directed twice daily. ??? furosemide (LASIX) 20 mg tablet Take one tablet by mouth every morning. Indications: visible water retention   ??? gabapentin (NEURONTIN) 300 mg capsule Take 300 mg by mouth daily.   ??? insulin lispro(+) 75/25 (HUMALOG MIX 75/25) 100 unit/mL (75/25) SC Susp Inject  under the skin at bedtime daily. Sliding scale   ??? losartan (COZAAR) 100 mg tablet Take one tablet by mouth daily.   ??? metFORMIN (GLUCOPHAGE) 1,000 mg tablet Take one tablet by mouth twice daily with meals. Resume on February 02, 2019   ??? metoprolol XL (TOPROL XL) 25 mg extended release tablet Take one tablet by mouth daily.   ??? naproxen (NAPROSYN) 500 mg PO tablet Take 1 tablet by mouth daily.   ??? polyethylene glycol 3350 (MIRALAX) 17 g packet Take 17 g by mouth as Needed.   ??? potassium chloride SR (K-DUR) 20 mEq tablet Take one tablet by mouth daily. Take with a meal and a full glass of water.   ??? zafirlukast(+) (ACCOLATE) 20 mg PO tablet Take 1 Tab by mouth Twice Daily.

## 2019-05-13 ENCOUNTER — Encounter: Admit: 2019-05-13 | Discharge: 2019-05-13 | Payer: MEDICARE

## 2019-05-13 NOTE — Telephone Encounter
-----   Message from Dorina Hoyer sent at 03/13/2019  2:32 PM CDT -----  Regarding: flp/liver per [facility]  Please have pt get fasting lipid/liver drawn before ov w/ [facility] in October 2020

## 2019-05-13 NOTE — Telephone Encounter
Mailed lab request to pt's home address.

## 2019-05-26 LAB — LIVER FUNCTION PANEL
Lab: 0.1
Lab: 0.2
Lab: 20
Lab: 24
Lab: 3.4
Lab: 7.8
Lab: 96

## 2019-05-26 LAB — LIPID PROFILE
Lab: 139
Lab: 148
Lab: 28
Lab: 3
Lab: 43
Lab: 77

## 2019-06-05 ENCOUNTER — Encounter: Admit: 2019-06-05 | Discharge: 2019-06-05 | Payer: MEDICARE

## 2019-06-05 DIAGNOSIS — E782 Mixed hyperlipidemia: Secondary | ICD-10-CM

## 2019-06-05 DIAGNOSIS — M549 Dorsalgia, unspecified: Secondary | ICD-10-CM

## 2019-06-05 DIAGNOSIS — Z951 Presence of aortocoronary bypass graft: Secondary | ICD-10-CM

## 2019-06-05 DIAGNOSIS — E119 Type 2 diabetes mellitus without complications: Secondary | ICD-10-CM

## 2019-06-05 DIAGNOSIS — M19011 Primary osteoarthritis, right shoulder: Secondary | ICD-10-CM

## 2019-06-05 DIAGNOSIS — E785 Hyperlipidemia, unspecified: Secondary | ICD-10-CM

## 2019-06-05 DIAGNOSIS — I2510 Atherosclerotic heart disease of native coronary artery without angina pectoris: Secondary | ICD-10-CM

## 2019-06-05 DIAGNOSIS — J439 Emphysema, unspecified: Secondary | ICD-10-CM

## 2019-06-05 DIAGNOSIS — H547 Unspecified visual loss: Secondary | ICD-10-CM

## 2019-06-05 DIAGNOSIS — I10 Essential (primary) hypertension: Secondary | ICD-10-CM

## 2019-06-05 DIAGNOSIS — K439 Ventral hernia without obstruction or gangrene: Secondary | ICD-10-CM

## 2019-06-05 DIAGNOSIS — Z993 Dependence on wheelchair: Secondary | ICD-10-CM

## 2019-06-05 DIAGNOSIS — M1990 Unspecified osteoarthritis, unspecified site: Secondary | ICD-10-CM

## 2019-06-05 DIAGNOSIS — E1159 Type 2 diabetes mellitus with other circulatory complications: Secondary | ICD-10-CM

## 2019-06-05 DIAGNOSIS — J45909 Unspecified asthma, uncomplicated: Secondary | ICD-10-CM

## 2019-06-05 DIAGNOSIS — I25119 Atherosclerotic heart disease of native coronary artery with unspecified angina pectoris: Secondary | ICD-10-CM

## 2019-06-05 DIAGNOSIS — E669 Obesity, unspecified: Secondary | ICD-10-CM

## 2019-06-05 DIAGNOSIS — R413 Other amnesia: Secondary | ICD-10-CM

## 2019-06-05 NOTE — Progress Notes
Date of Service: 06/05/2019    Amy Bowman is a 78 y.o. female.       HPI     This a very nice 78 year old white female with a history of CAD and previous surgical revascularization in 1999 (LIMA to LAD, SVG to OM branch, SVG to PDA).  Patient did have symptoms compatible with angina, she was evaluated with a perfusion imaging study in May 2013, this was abnormal and thus she did undergo a left heart catheterization on 01/29/2019, this resulted in PCI of the mid to distal circumflex artery with 2 drug-eluting stents, at that time she was also found to have an atretic LIMA to LAD, SVG graft to the left PDA was 100% occluded and the SVG to obtuse marginal 1 was patent.    Patient is morbidly obese, however she has managed to lose approximately 34 pounds in the past.  Through portion control.    She does have a history of bladder cancer and she is due for   urethroscopy later this month.  Patient also has severe osteoarthritis which actually has led to severe in permanent and inability to perform physical activity.  Patient does use a motorized wheelchair.    She is doing well from a cardiac standpoint, she has not had chest pain or heart palpitations.  The blood pressure is slightly elevated today in the office.         Vitals:    06/05/19 1351 06/05/19 1403   BP: (!) 152/76 (!) 158/78   BP Source: Arm, Left Upper Arm, Right Upper   Pulse: 78    Temp: 36.7 ?C (98.1 ?F)    SpO2: 97%    Weight: 108 kg (238 lb 3.2 oz)    Height: 1.524 m (5')    PainSc: Zero      Body mass index is 46.52 kg/m?Marland Kitchen     Past Medical History  Patient Active Problem List    Diagnosis Date Noted   ? H/O right coronary artery stent placement 03/13/2019   ? Able to mobilize using indoor motorized wheelchair 03/13/2019   ? Hospital discharge follow-up 03/13/2019   ? Coronary artery disease due to lipid rich plaque 01/29/2019   ? Abnormal thallium stress test 01/16/2019   ? 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 Pre-operative cardiovascular examination 04/22/2015   ? Preoperative cardiovascular examination 11/26/2014   ? 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 Obesity 06/02/2009   ? Emphysema 06/02/2009   ? DJD (degenerative joint disease) 06/02/2009   ? 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 Abdominal wall hernia 06/02/2009   ? CAD (coronary artery disease)      CABG 1999 at [facility]:  LIMA > LAD, SVG to OM,  SVG to inferior OM> PDA  01/29/2019 cath   FINAL IMPRESSION:    1. Severe disease of the mid to distal dominant left circumflex.  2. Atretic left internal mammary artery to the diagonal graft, 100% occluded vein graft to the left posterior descending artery.  3. Patent vein graft to obtuse marginal 1.  4. Successful percutaneous coronary intervention of the native mid to distal left circumflex with Xience 3.0 x 28 mm and Xience Sierra 2.5 x 38 mm drug-eluting stent placed in an overlapping fashion, postdilated with 3.0 mm noncompliant balloon.  5. High normal left ventricular  end-diastolic pressure.     ? Type II diabetes mellitus (HCC)    ? HLD (hyperlipidemia)    ? HTN (hypertension)    ? Near syncope      10/16/2013-[facility] Carotid Artery US-Impression:  Some minimal atherosclerotic plaque evident bilaterally in the carotid bulbs and origin of the internal carotid arteries that does not appear to be hemodynamically significant at this time with findings consistent with less than a 50% diameter stenosis left and right.           Review of Systems   Constitution: Negative.   HENT: Positive for nosebleeds.    Eyes: Negative.    Cardiovascular: Positive for dyspnea on exertion.   Respiratory: Positive for cough and shortness of breath.    Endocrine: Negative.    Hematologic/Lymphatic: Negative.    Skin: Negative.    Musculoskeletal: Positive for arthritis, back pain, joint pain, muscle weakness, myalgias and stiffness.   Gastrointestinal: Positive for bloating, abdominal pain, change in bowel habit, constipation and diarrhea.   Genitourinary: Negative.    Neurological: Positive for light-headedness.   Psychiatric/Behavioral: Negative.    Allergic/Immunologic: Negative.        Physical Exam  General Appearance: obese, patient uses a motorized wheelchair  Skin: warm, moist, no ulcers or xanthomas  Eyes: conjunctivae and lids normal, pupils are equal and round  Lips & Oral Mucosa: no pallor or cyanosis  Neck Veins: neck veins are flat, neck veins are not distended  Chest Inspection: chest is normal in appearance  Respiratory Effort: breathing comfortably, no respiratory distress  Auscultation/Percussion: lungs clear to auscultation, no rales or rhonchi, no wheezing  Cardiac Rhythm: regular rhythm and normal rate  Cardiac Auscultation: S1, S2 normal, no rub, no gallop  Murmurs: no murmur  Carotid Arteries: normal carotid upstroke bilaterally, no bruit  Abdominal aorta: could not be examined due to obese adomen  Lower Extremity Edema: no lower extremity edema  Abdominal Exam: soft, non-tender, no masses, bowel sounds normal  Liver & Spleen: no organomegaly  Language and Memory: patient responsive and seems to comprehend information  Neurologic Exam: neurological assessment grossly intact      Cardiovascular Studies      Problems Addressed Today  Encounter Diagnoses   Name Primary?   ? Coronary artery disease involving native coronary artery of native heart with angina pectoris (HCC) Yes   ? Mixed hyperlipidemia    ? Essential hypertension    ? Hx of CABG    ? Coronary artery disease due to lipid rich plaque    ? Type 2 diabetes mellitus with other circulatory complication, with long-term current use of insulin (HCC)    ? Class 3 severe obesity due to excess calories with serious comorbidity and body mass index (BMI) of 45.0 to 49.9 in adult [facility])    ? Primary osteoarthritis of right shoulder ? Able to mobilize using indoor motorized wheelchair        Assessment and Plan     In summary: This is a 78 year old white female with a history of stable CAD, she is status post previous surgical revascularization and PCI to the native mid to distal left circumflex artery with a drug-eluting stent on 01/29/2019, patient is also morbidly obese, she did manage to lose weight (approximately 34 pounds) through diet/portion control, her physical mobility is greatly impaired due to obesity and osteoarthritis, patient does use a motorized wheelchair to get around.  She does have a history of bladder cancer.    Plan:  1.  Continue all current medications including the dual antiplatelet therapy for the time being  ? Patient does have an appointment in the urology department at [facility], she will also see Dr. Dareen Piano in the orthopedic department at [facility] and once she will have a specific date for surgical intervention then we will make recommendations regarding dual antiplatelet therapy.  2.  Follow-up office visit in 6 months.         Current Medications (including today's revisions)  ? acetaminophen (TYLENOL) 500 mg tablet Take 1,000 mg by mouth twice daily as needed. Max of 4,000 mg of acetaminophen in 24 hours.    ? albuterol (VENTOLIN HFA, PROAIR HFA) 90 mcg/Actuation IN inhaler Inhale 2 puffs by mouth into the lungs twice daily.   ? aspirin EC 81 mg PO tablet Take 1 Tab by mouth Daily.   ? atorvastatin (LIPITOR) 40 mg tablet Take one tablet by mouth daily. Indications: high cholesterol and high triglycerides   ? BIOTIN PO Take 1 tablet by mouth daily.   ? clopiDOGrel (PLAVIX) 75 mg tablet Take one tablet by mouth daily.   ? fluticasone (FLONASE) 50 mcg/actuation nasal spray Apply 1 spray to each nostril as directed daily.   ? furosemide (LASIX) 20 mg tablet Take one tablet by mouth every morning. Indications: visible water retention ? gabapentin (NEURONTIN) 300 mg capsule Take 300 mg by mouth daily.   ? insulin lispro(+) 75/25 (HUMALOG MIX 75/25) 100 unit/mL (75/25) SC Susp Inject  under the skin at bedtime daily. Sliding scale   ? losartan (COZAAR) 100 mg tablet Take one tablet by mouth daily.   ? metFORMIN (GLUCOPHAGE) 1,000 mg tablet Take one tablet by mouth twice daily with meals. Resume on February 02, 2019   ? metoprolol XL (TOPROL XL) 25 mg extended release tablet Take one tablet by mouth daily.   ? naproxen (NAPROSYN) 500 mg PO tablet Take 1 tablet by mouth daily.   ? polyethylene glycol 3350 (MIRALAX) 17 g packet Take 17 g by mouth as Needed.   ? potassium chloride SR (K-DUR) 20 mEq tablet Take one tablet by mouth daily. Take with a meal and a full glass of water.   ? zafirlukast(+) (ACCOLATE) 20 mg PO tablet Take 1 Tab by mouth Twice Daily.

## 2019-08-31 ENCOUNTER — Encounter: Admit: 2019-08-31 | Discharge: 2019-08-31 | Payer: MEDICARE

## 2019-09-01 MED ORDER — LOSARTAN 100 MG PO TAB
ORAL_TABLET | Freq: Every day | ORAL | 3 refills | 90.00000 days | Status: AC
Start: 2019-09-01 — End: ?

## 2019-10-31 ENCOUNTER — Encounter: Admit: 2019-10-31 | Discharge: 2019-10-31 | Payer: MEDICARE

## 2019-10-31 MED ORDER — KLOR-CON M20 20 MEQ PO TBTQ
ORAL_TABLET | Freq: Every day | ORAL | 3 refills | 30.00000 days | Status: AC
Start: 2019-10-31 — End: ?

## 2019-11-14 ENCOUNTER — Encounter: Admit: 2019-11-14 | Discharge: 2019-11-14 | Payer: MEDICARE

## 2019-11-14 NOTE — Telephone Encounter
Patient called asking when she would be able to quit taking plavix. She is concerned it is making her hair fall out.   Spoke with Dr. Virgina Organ and she gives the ok to stop plavix on one year date from stent placement.  Patient informed she can stop taking plavix on January 29, 2020.

## 2019-12-04 ENCOUNTER — Encounter: Admit: 2019-12-04 | Discharge: 2019-12-04 | Payer: MEDICARE

## 2019-12-04 MED ORDER — METOPROLOL SUCCINATE 25 MG PO TB24
25 mg | ORAL_TABLET | Freq: Every day | ORAL | 2 refills | 90.00000 days | Status: AC
Start: 2019-12-04 — End: ?

## 2019-12-04 MED ORDER — FUROSEMIDE 20 MG PO TAB
ORAL_TABLET | ORAL | 3 refills | 90.00000 days | Status: AC
Start: 2019-12-04 — End: ?

## 2020-01-27 ENCOUNTER — Encounter: Admit: 2020-01-27 | Discharge: 2020-01-27 | Payer: MEDICARE

## 2020-01-27 DIAGNOSIS — I25119 Atherosclerotic heart disease of native coronary artery with unspecified angina pectoris: Secondary | ICD-10-CM

## 2020-01-27 MED ORDER — CLOPIDOGREL 75 MG PO TAB
ORAL_TABLET | Freq: Every day | ORAL | 3 refills | 90.00000 days | Status: DC
Start: 2020-01-27 — End: 2020-03-09

## 2020-03-03 ENCOUNTER — Encounter: Admit: 2020-03-03 | Discharge: 2020-03-03 | Payer: MEDICARE

## 2020-03-03 MED ORDER — ATORVASTATIN 40 MG PO TAB
40 mg | ORAL_TABLET | Freq: Every day | ORAL | 3 refills | Status: AC
Start: 2020-03-03 — End: ?

## 2020-03-04 ENCOUNTER — Encounter: Admit: 2020-03-04 | Discharge: 2020-03-04 | Payer: MEDICARE

## 2020-03-09 ENCOUNTER — Encounter: Admit: 2020-03-09 | Discharge: 2020-03-09 | Payer: MEDICARE

## 2020-03-09 DIAGNOSIS — J45909 Unspecified asthma, uncomplicated: Secondary | ICD-10-CM

## 2020-03-09 DIAGNOSIS — K439 Ventral hernia without obstruction or gangrene: Secondary | ICD-10-CM

## 2020-03-09 DIAGNOSIS — Z993 Dependence on wheelchair: Secondary | ICD-10-CM

## 2020-03-09 DIAGNOSIS — I10 Essential (primary) hypertension: Secondary | ICD-10-CM

## 2020-03-09 DIAGNOSIS — I2510 Atherosclerotic heart disease of native coronary artery without angina pectoris: Secondary | ICD-10-CM

## 2020-03-09 DIAGNOSIS — I25119 Atherosclerotic heart disease of native coronary artery with unspecified angina pectoris: Secondary | ICD-10-CM

## 2020-03-09 DIAGNOSIS — E119 Type 2 diabetes mellitus without complications: Secondary | ICD-10-CM

## 2020-03-09 DIAGNOSIS — Z01810 Encounter for preprocedural cardiovascular examination: Secondary | ICD-10-CM

## 2020-03-09 DIAGNOSIS — Z9981 Dependence on supplemental oxygen: Secondary | ICD-10-CM

## 2020-03-09 DIAGNOSIS — E785 Hyperlipidemia, unspecified: Secondary | ICD-10-CM

## 2020-03-09 DIAGNOSIS — J439 Emphysema, unspecified: Secondary | ICD-10-CM

## 2020-03-09 DIAGNOSIS — Z955 Presence of coronary angioplasty implant and graft: Secondary | ICD-10-CM

## 2020-03-09 DIAGNOSIS — E782 Mixed hyperlipidemia: Secondary | ICD-10-CM

## 2020-03-09 DIAGNOSIS — M549 Dorsalgia, unspecified: Secondary | ICD-10-CM

## 2020-03-09 DIAGNOSIS — M1990 Unspecified osteoarthritis, unspecified site: Secondary | ICD-10-CM

## 2020-03-09 DIAGNOSIS — Z951 Presence of aortocoronary bypass graft: Secondary | ICD-10-CM

## 2020-03-09 DIAGNOSIS — E1159 Type 2 diabetes mellitus with other circulatory complications: Secondary | ICD-10-CM

## 2020-03-09 DIAGNOSIS — Z23 Encounter for immunization: Secondary | ICD-10-CM

## 2020-03-09 DIAGNOSIS — R413 Other amnesia: Secondary | ICD-10-CM

## 2020-03-09 DIAGNOSIS — D494 Neoplasm of unspecified behavior of bladder: Secondary | ICD-10-CM

## 2020-03-09 DIAGNOSIS — E669 Obesity, unspecified: Secondary | ICD-10-CM

## 2020-03-09 DIAGNOSIS — H547 Unspecified visual loss: Secondary | ICD-10-CM

## 2020-03-09 NOTE — Progress Notes
Date of Service: 03/09/2020    Amy Bowman is a 79 y.o. female.       HPI     Patient is a 79 year old white female. She has a history of coronary artery disease, this patient underwent CABG 1999 (LIMA to LAD, SVG to obtuse marginal branch, SVG to PDA). Patient did undergo evaluation with a perfusion imaging study due to symptoms of angina, this was performed in May 2020, the perfusion pattern was abnormal. Thus, patient did undergo a left heart catheterization in June 2020, she was found to have severe disease of the mid to distal abdominal left circumflex system, atretic LIMA to diagonal graft, 100% occluded of the SVG to the left PDA, patent SVG to OM1, patient did undergo PCI of the native mid to distal left circumflex artery with a drug-eluting stent. She continue dual antiplatelet therapy for approximately 1 year, this was discontinued this last June..    Other significant cardiac comorbidities include:    ? Morbid obesity?current BMI is 45.62  ? Recent history of weight loss?patient reports that a while ago she weighed 277 pounds, today her weight is 233 pounds, she has been able to accomplish this mainly through diet  ? Patient is on oxygen therapy?she uses 2 L/min, she does have a history of oxygen desaturation, the etiology Of this is probably a combination of morbid obesity, physical deconditioning, previous history of smoking with subsequent COPD  ? Patient has very poor physical mobility?she is currently using a motorized wheelchair  ? Osteoarthritis?patient recently has been experiencing severe left shoulder pain and it is anticipated that she will undergo orthopedic surgical intervention  ? 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 However, due to multiple comorbidities she is physically impaired and therefore she probably does not perform enough physical activity to trigger angina. She remained hemodynamically stable with a well-controlled blood pressure and heart rate.         Vitals:    03/09/20 1525   BP: 136/78   BP Source: Arm, Right Upper   Patient Position: Sitting   Pulse: 76   SpO2: 97%   Weight: 106 kg (233 lb 9.6 oz)   Height: 1.524 m (5')   PainSc: Six     Body mass index is 45.62 kg/m?Marland Kitchen     Past Medical History  Patient Active Problem List    Diagnosis Date Noted   ? H/O right coronary artery stent placement 03/13/2019   ? Able to mobilize using indoor motorized wheelchair 03/13/2019   ? Hospital discharge follow-up 03/13/2019   ? Coronary artery disease due to lipid rich plaque 01/29/2019   ? Abnormal thallium stress test 01/16/2019   ? 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 Pre-operative cardiovascular examination 04/22/2015   ? Preoperative cardiovascular examination 11/26/2014   ? 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 Obesity 06/02/2009   ? Emphysema 06/02/2009   ? DJD (degenerative joint disease) 06/02/2009   ? 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 Abdominal wall hernia 06/02/2009   ? CAD (coronary artery disease)      CABG 1999 at [facility]:  LIMA > LAD, SVG to OM,  SVG to inferior OM> PDA  01/29/2019 cath  FINAL IMPRESSION:    1. Severe disease of the mid to distal dominant left circumflex.  2. Atretic left internal mammary artery to the diagonal graft, 100% occluded vein graft to the left posterior descending artery.  3. Patent vein graft to obtuse marginal 1.  4. Successful percutaneous coronary intervention of the native mid to distal left circumflex with Xience 3.0 x 28 mm and Xience Sierra 2.5 x 38 mm drug-eluting stent placed in an overlapping fashion, postdilated with 3.0 mm noncompliant balloon.  5. High normal left ventricular end-diastolic pressure.     ? Type II diabetes mellitus (HCC)    ? HLD (hyperlipidemia)    ? HTN (hypertension)    ? Near syncope      10/16/2013-[facility] Carotid Artery US-Impression:  Some minimal atherosclerotic plaque evident bilaterally in the carotid bulbs and origin of the internal carotid arteries that does not appear to be hemodynamically significant at this time with findings consistent with less than a 50% diameter stenosis left and right.           Review of Systems   Constitution: Negative.   HENT: Negative.    Eyes: Negative.    Cardiovascular: Negative.    Respiratory: Positive for shortness of breath.    Endocrine: Negative.    Hematologic/Lymphatic: Negative.    Skin: Negative.    Musculoskeletal: Positive for joint pain (arm ).   Gastrointestinal: Negative.    Genitourinary: Negative.    Neurological: Negative.    Psychiatric/Behavioral: Negative.    Allergic/Immunologic: Negative.        Physical Exam  General Appearance: Morbidly obese, BMI 45.62, patient uses a motorized wheelchair, patient is on oxygen therapy  Skin: warm, moist, no ulcers or xanthomas  Eyes: conjunctivae and lids normal, pupils are equal and round  Lips & Oral Mucosa: no pallor or cyanosis  Neck Veins: neck veins are flat, neck veins are not distended  Chest Inspection: chest is normal in appearance  Respiratory Effort: breathing comfortably, no respiratory distress  Auscultation/Percussion: lungs clear to auscultation, no rales or rhonchi, no wheezing  Cardiac Rhythm: regular rhythm and normal rate  Cardiac Auscultation: S1, S2 normal, no rub, no gallop  Murmurs: no murmur  Carotid Arteries: normal carotid upstroke bilaterally, no bruit  Lower Extremity Edema: no lower extremity edema  Abdominal Exam: soft, non-tender, no masses, bowel sounds normal  Liver & Spleen: no organomegaly  Language and Memory: patient responsive and seems to comprehend information  Neurologic Exam: neurological assessment grossly intact      Cardiovascular Studies  Twelve-lead EKG demonstrates normal sinus rhythm, no ST segment T wave changes    Problems Addressed Today  Encounter Diagnoses   Name Primary?   ? Coronary artery disease involving native coronary artery of native heart with angina pectoris (HCC) Yes   ? Mixed hyperlipidemia    ? Essential hypertension    ? Hx of CABG    ? Coronary artery disease due to lipid rich plaque    ? Type 2 diabetes mellitus with other circulatory complication, with long-term current use of insulin (HCC)    ? Class 3 severe obesity due to excess calories with serious comorbidity and body mass index (BMI) of 45.0 to 49.9 in adult [facility])    ? Abdominal wall hernia    ? Bladder tumor    ? H/O right coronary artery stent placement    ? Able to mobilize using indoor motorized wheelchair    ? COVID-19 vaccine administered    ?  Preop cardiovascular exam    ? On home oxygen therapy        Assessment and Plan     In summary: This is a 79 year old white female with stable coronary artery disease, status post previous CABG, status post PCI to left circumflex artery in June 2020, patient continued on dual antiplatelet therapy for 1 year, she did stop clopidogrel in June 2021.    Other comorbidities include: Morbid obesity, multifactorial poor functional status due to weight issues, osteoarthritis, patient does use a motorized wheelchair, oxygen use, hypertension and hyperlipidemia.    A 2D echo Doppler study dated 12/21/2018 demonstrated normal LV function, there were no significant valvular abnormalities and no evidence of pulmonary hypertension.    Plan:    1. Continue all current cardiac medications  2. From a cardiac standpoint this patient is perhaps intermediate risk to undergo general anesthesia and orthopedic intervention of the left shoulder, however overall there are no contraindications and as long as she understands the risk and benefits patient can proceed as it will be scheduled by the orthopedic department.  3. Follow-up office visit in 10 to 12 months.         Current Medications (including today's revisions)  ? acetaminophen (TYLENOL) 500 mg tablet Take 1,000 mg by mouth twice daily as needed. Max of 4,000 mg of acetaminophen in 24 hours.    ? albuterol (VENTOLIN HFA, PROAIR HFA) 90 mcg/Actuation IN inhaler Inhale 2 puffs by mouth into the lungs twice daily.   ? aspirin EC 81 mg PO tablet Take 1 Tab by mouth Daily.   ? atorvastatin (LIPITOR) 40 mg tablet TAKE ONE TABLET BY MOUTH DAILY. INDICATIONS: HIGH CHOLESTEROL AND HIGH TRIGLYCERIDES   ? BIOTIN PO Take 1 tablet by mouth daily.   ? clopiDOGrel (PLAVIX) 75 mg tablet TAKE 1 TABLET BY MOUTH EVERY DAY   ? fluticasone (FLONASE) 50 mcg/actuation nasal spray Apply 1 spray to each nostril as directed daily.   ? furosemide (LASIX) 20 mg tablet TAKE 1 TABLET BY MOUTH IN THE MORNING FOR VISIBLE WATER RETENTION   ? gabapentin (NEURONTIN) 300 mg capsule Take 300 mg by mouth daily.   ? insulin lispro(+) 75/25 (HUMALOG MIX 75/25) 100 unit/mL (75/25) SC Susp Inject  under the skin at bedtime daily. Sliding scale   ? KLOR-CON M20 20 mEq tablet TAKE 1 TABLET BY MOUTH EVERY DAY WITH MEAL AND FULL GLASS OF WATER   ? losartan (COZAAR) 100 mg tablet TAKE 1 TABLET BY MOUTH EVERY DAY   ? metFORMIN (GLUCOPHAGE) 1,000 mg tablet Take one tablet by mouth twice daily with meals. Resume on February 02, 2019 (Patient taking differently: Take 1,000 mg by mouth daily. Resume on February 02, 2019)   ? metoprolol XL (TOPROL XL) 25 mg extended release tablet Take one tablet by mouth daily.   ? naproxen (NAPROSYN) 500 mg PO tablet Take 1 tablet by mouth daily.   ? polyethylene glycol 3350 (MIRALAX) 17 g packet Take 17 g by mouth as Needed.   ? zafirlukast(+) (ACCOLATE) 20 mg PO tablet Take 1 Tab by mouth Twice Daily.

## 2020-03-31 IMAGING — NM NM stress 2[PERSON_NAME]>250 M>275
4 series · 60 of 60 positions shown · non-contrast
Comparison: none

01/01/19 - Procedure: ADAC MULTI GATED SESTAMIBI REGADENOSON MPI STRESS TEST
Duration and Sequencing:  Two-Day Stress/Rest

Interpretation Summary
Nuclear Report
[REDACTED] [REDACTED]
Division of Nuclear Cardiac Imaging
Consultation Report
EXAMINATION:  Gated Bechnetium-55m Sestamibi Two-Day Stress/Rest myocardial perfusion single-photon
emission computed tomography resting regional wall function, post stress ejection fraction, and
perfusion imaging utilizing Regadenoson pharmacological stress.
Study #:  [id number]
KU MRN:  [id number]
[HOSPITAL] ID:
BMI: 49.8 kg/m2
INDICATIONS FOR STUDY (HISTORY):  This is a 77 year old female with a history of coronary artery
disease diabetes mellitus and chest pain.
PROCEDURAL DETAILS:  This Gated Two-Day Stress/Rest perfusion study was performed using Technetium-
99m Sestamibi as the perfusion agent.  Initially, the patient received 5 ml intravenous infusion of
Regadenoson at 0.08 mg/ml over 10 to 15 seconds. Approximately 20 seconds later 28.9 mCi of
Bechnetium-55m Sestamibi was injected intravenously. Continuous electrocardiographic monitoring and
serial electrocardiograms were obtained, as well as intermittent blood pressure recordings. Planar
and gated tomographic images were acquired 45 to 60 minutes after discontinuation of the Regadenoson
infusion.  Subsequently, the patient returned 24 hours later  and received an additional 29.3 mCi of
Bechnetium-55m Sestamibi intravenously. Rest images were acquired and compared to post stress
images.

[sgate · 6.78mm/px · 4 of 576 frames shown]
[frame 49/576]
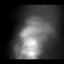
[frame 241/576]
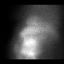
[frame 433/576]
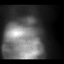
[frame 529/576]
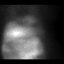

[rest · 6.78mm/px · 3 of 72 frames shown]
[frame 19/72]
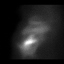
[frame 43/72]
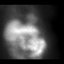
[frame 67/72]
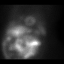

[cardiac spect · 6.78mm/px · 14 acquisitions, 46 frames shown]
[im 1/14]
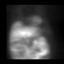
[im 1/14]
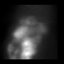
[im 1/14]
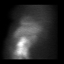
[im 2/14]
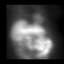
[im 2/14]
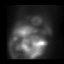
[im 2/14]
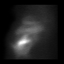
[im 3/14]
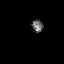
[im 3/14]
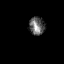
[im 3/14]
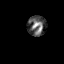
[im 3/14]
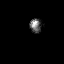
[im 4/14]
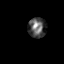
[im 4/14]
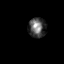
[im 4/14]
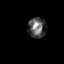
[im 5/14]
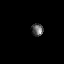
[im 5/14]
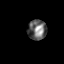
[im 5/14]
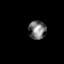
[im 6/14]
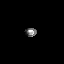
[im 6/14]
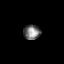
[im 6/14]
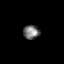
[im 6/14]
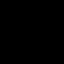
[im 7/14]
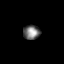
[im 7/14]
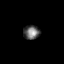
[im 7/14]
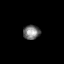
[im 8/14]
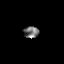
[im 8/14]
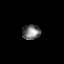
[im 8/14]
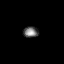
[im 9/14]
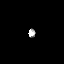
[im 9/14]
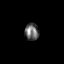
[im 9/14]
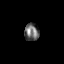
[im 9/14]
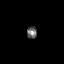
[im 10/14]
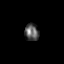
[im 10/14]
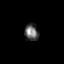
[im 10/14]
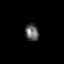
[im 11/14]
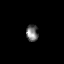
[im 11/14]
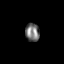
[im 11/14]
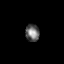
[im 12/14]
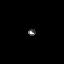
[im 12/14]
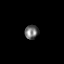
[im 12/14]
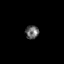
[im 12/14]
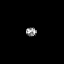
[im 13/14]
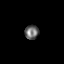
[im 13/14]
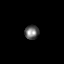
[im 13/14]
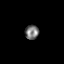
[im 14/14]
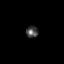
[im 14/14]
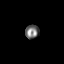
[im 14/14]
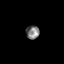

[qgsqps_results · 6.8mm · 6.78mm/px · 2 acquisitions, 7 frames shown]
[im 1/2]
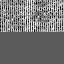
[im 1/2]
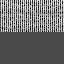
[im 1/2]
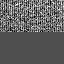
[im 2/2]
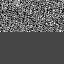
[im 2/2]
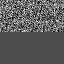
[im 2/2]
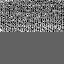
[im 2/2]
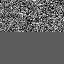

[60 of 60 positions shown; findings below may reference images not displayed]

FINDINGS: Pharmacological Stress Electrocardiogram:  The patient's resting heart rate was 73 bpm and the
resting blood pressure was 132/72.  The patient?s peak stress heart rate was 94 bpm and the peak
stress blood pressure was 163/72.

With pharmacological stress the patient noted shortness of air.  Baseline electrocardiogram shows
normal sinus rhythm with slight interventricular conduction delay and mild ST abnormalities
throughout.
CONCLUSION: This is a nonischemic regadenoson stress electrocardiogram.

Pulmonary-to-Myocardial Count Ratio:  0.36  (normal = or < 0.52).

Scintigraphic (planar/tomographic):

TID Ratio:  1.22  (normal <1.36).

Summed Stress Score:  8   , Summed Rest Score:  0

Post stress perfusion image datasets and resting datasets show intense bowel activity.  Tomography
shows partially reversible defect more prominently in the inferior wall and also a small lesser
degree of reversible uptake in the anterior lateral wall.  The perfusion defects appear to be
present in a ramus type distribution and also a right coronary artery or Yankun Majewski distribution.
Both areas are viable.

Regional Wall Thickening and Motion Post Stress: Abnormal septal motion is present but thickening is
maintained.

Left Ventricular Ejection Fraction (post stress, in the resting state) =  64 %.

Left Ventricular End Diastolic Volume: 74 mL

SUMMARY/OPINION:   This study is abnormal with mild ischemia likely in a ramus intermedius territory
and also more moderate ischemia in the dominant right coronary artery territory or distal left
circumflex.  The defects retain viability.  Other high risk indicators are not noted.

This exam is compared to the patient's previous myocardial perfusion study dated 8769.

The previous study was also performed with technetium.  The previous exam was interpreted to show
attenuation artifact in the inferolateral wall but no statistically significant defect..  When the 2
studies are compared directly the defect seen on the current exam are now statistically significant
and in similar vascular territories.  Overall however the study is not high risk.

PACS Images

Tech Notes:

## 2020-03-31 IMAGING — US ECHOCOMPL
1 series · 12 of 24 positions shown · non-contrast
Comparison: none

[Series 1: us echo 2d, wo/w m-mode, compl · 92 acquisitions, 12 frames shown]
[im 1/92]
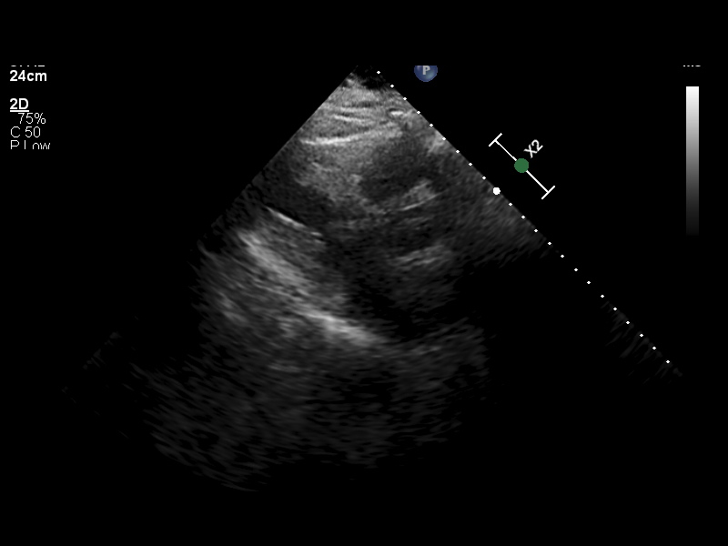
[im 12/92]
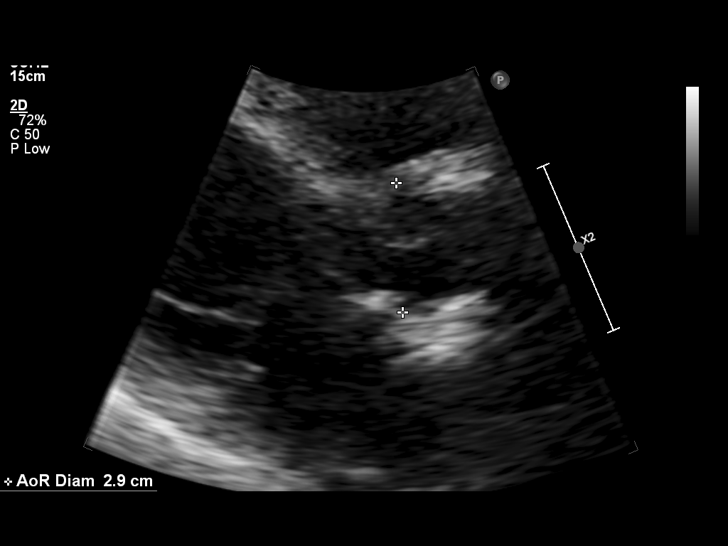
[im 20/92]
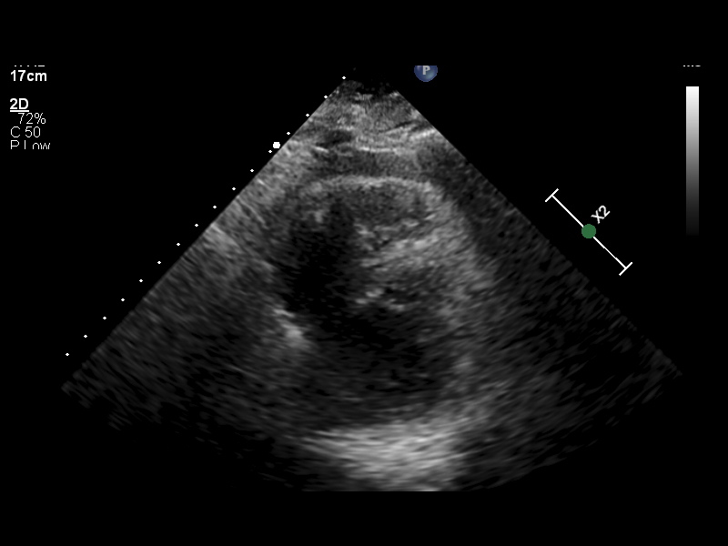
[im 28/92]
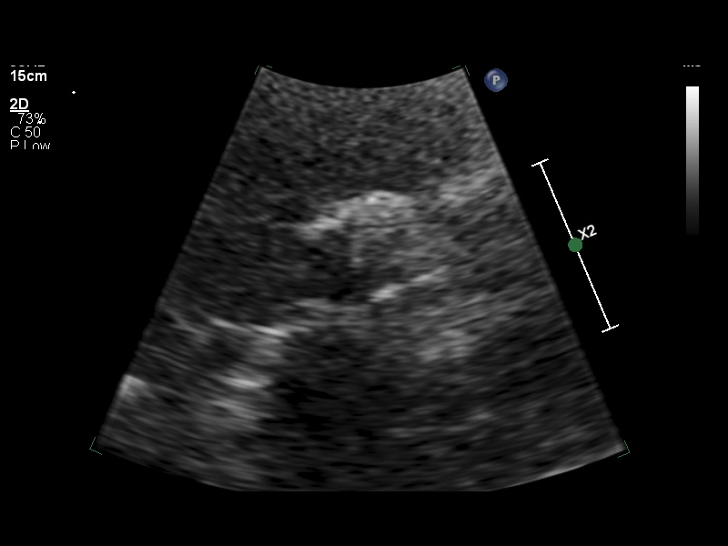
[im 36/92]
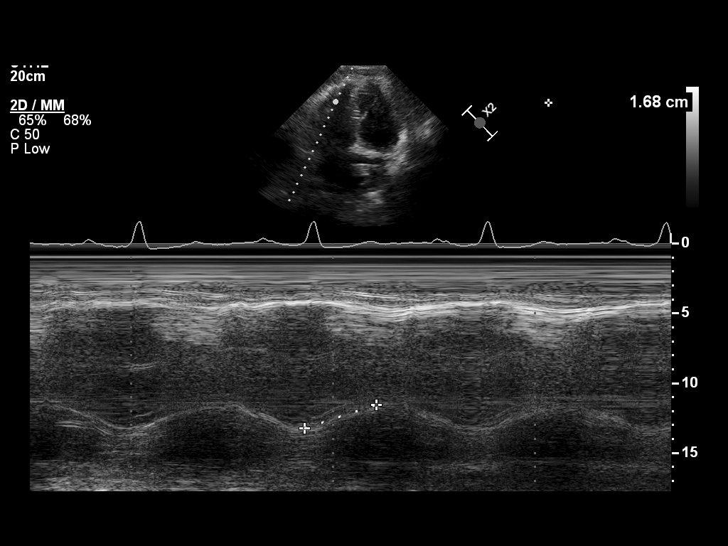
[im 44/92]
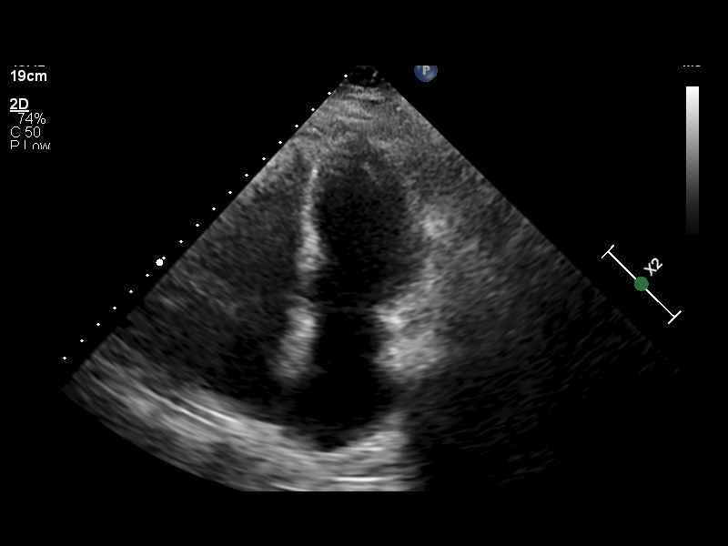
[im 52/92]
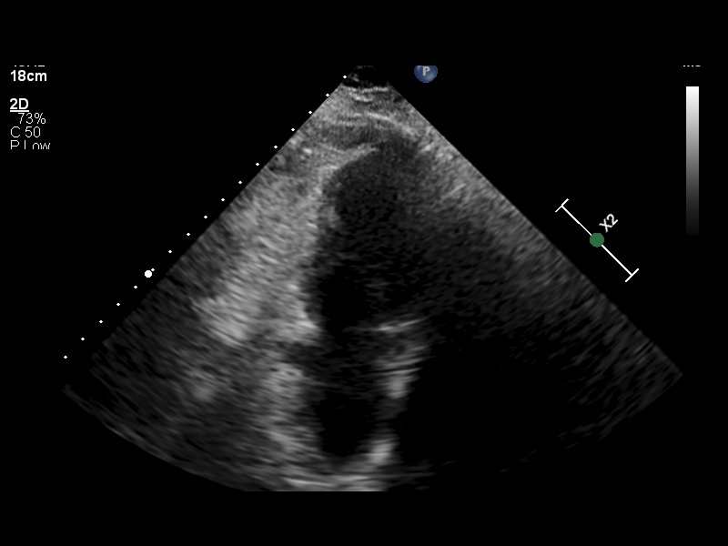
[im 64/92]
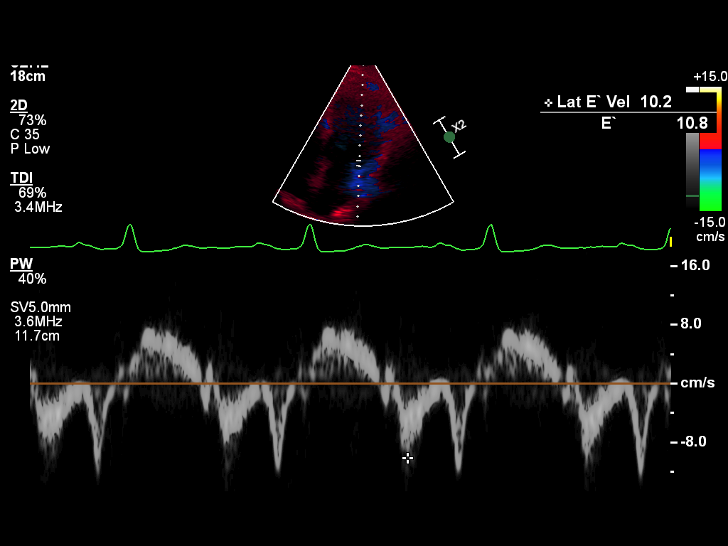
[im 72/92]
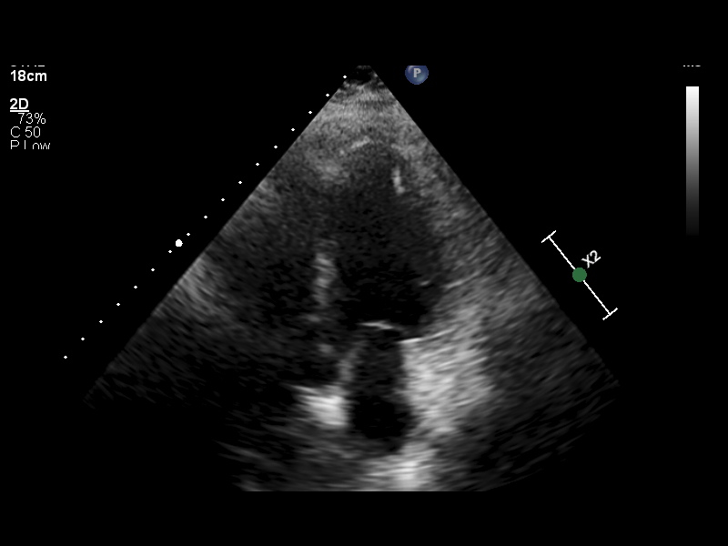
[im 80/92]
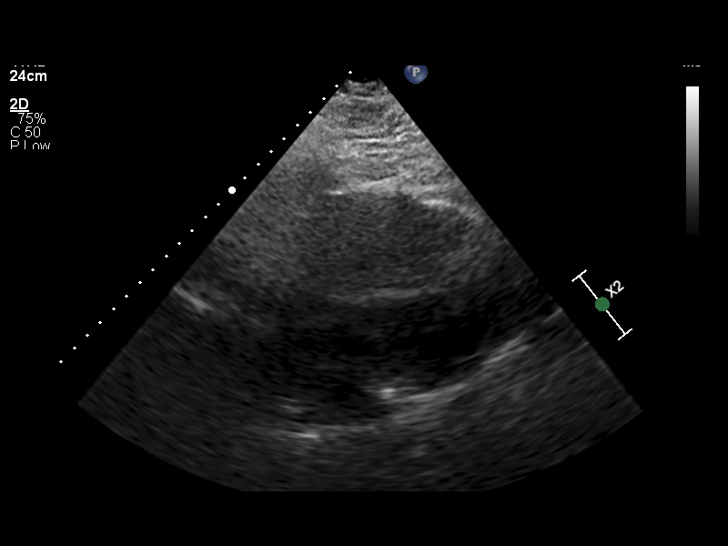
[im 84/92]
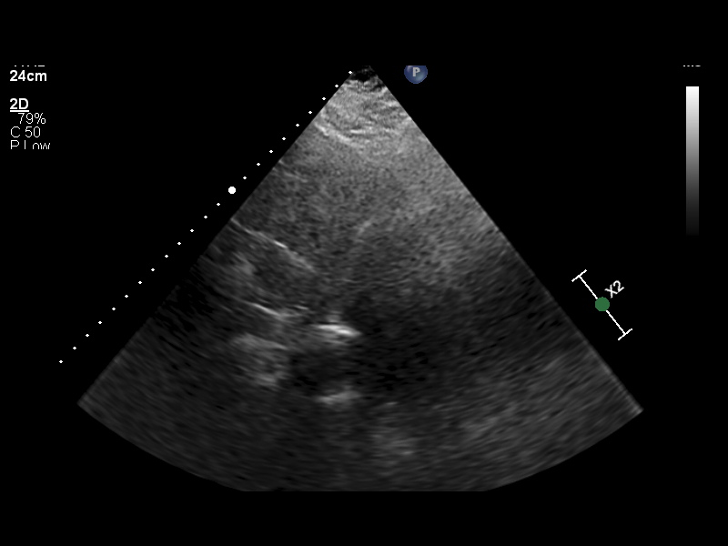
[im 92/92]
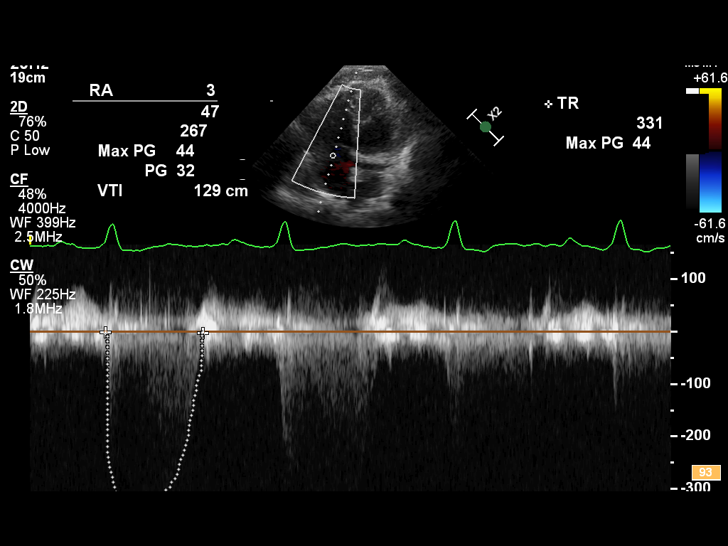

[12 of 24 positions shown; findings below may reference images not displayed]

[REDACTED]D + DOPPLER ECHOCARDIOGRAM
Location Performed: [HOSPITAL]

Referring Provider:
Interpreting Physician: Tatis, Chelo
Modasir:
Location of Interp:
Sonographer: External Staff

Indications: Hypertension     Coronary artery disease     DM2

Vitals
Height   Weight   BSA (Calculated)   BP   Comments
152.4 cm (60")   115.7 kg (255 lb)   2.21   137/71

Interpretation Summary
Preserved/hyperdynamic left ventricular systolic function with an ejection fraction of greater than
65%.  Mild LV thickness.
Normal right ventricular size.  Per TAPSE/S' mild RV systolic dysfunction.  However grossly, the RV
appears to be preserved in terms systolic function.
Normal diastolic function.
No regional wall motion abnormality.
No chamber enlargement.
Mild to moderate aortic insufficiency.  Aortic valve morphology cannot be adequately assessed on the
current study.  Mild tricuspid regurgitation.  Mild pulmonic insufficiency.
No pericardial effusion.
Estimated central venous pressure 0 to 5 mmHg.  Estimated peak PA systolic pressure cannot be
estimated given adequate TR jet.
The visualized portions of the aortic root and ascending thoracic aorta are within normal limits.

Compared to a prior 1407 transthoracic echocardiogram, the left ventricular ejection fraction
appears preserved.  There is interval development of mild to moderate aortic insufficiency.  There
are no other clinically significant findings.

Echocardiographic Findings
Left Ventricle   Normal size, wall thickness and shape. Concentric remodeling. Hyperdynamic left
ventricular function. Normal left ventricular diastolic function.
Right Ventricle   Normal size, wall thickness, ejection fraction and septal motion.
Left Atrium   Normal size.
Right Atrium   Normal size.
IVC/SVC   Normal central venous pressure (0-5 mm Hg).
Mitral Valve   Mild regurgitation.
Tricuspid Valve   Mild regurgitation.
Aortic Valve   Mild to moderate regurgitation.
Pulmonary   Mild regurgitation.
Aorta   The aortic root and ascending aorta are normal in size.
Pericardium   No pericardial effusion.

Left Ventricular Wall Scoring
Score Index: 1.000   Percent Normal: 100.0%

The left ventricular wall motion is normal.

Left Heart 2D Measurements (Normal Ranges)
EF
72 %
LVIDD
4.2 cm (Range: 3.8 - 5.2)
LVIDS
2.6 cm (Range: 2.2 - 3.5)
IVS
1.0 cm (Range: 0.6 - 0.9)
LV PW
1.1 cm (Range: 0.6 - 0.9)
LA Size
3.9 cm (Range: 2.7 - 3.8)

Right Heart 2D   M-Mode Measurements (Normal Ranges) (Range)
RV Basal Dia
4.1 cm (2.5 - 4.1)
RV Mid Dia
3.3 cm (1.9 - 3.5)
PASKARAN
21.3 cm2 (<18)
M-Mode TAPSE
1.7 cm (>1.7)

Left Heart 2D Addnl Measurements (Normal Ranges)
LA Vol
44.0 mL (Range: 22 - 52)
LA Vol Index
19.91 (Range: 16 - 34)
LV Mass
147.00 g (Range: 67 - 162)
LV Mass Index
66.51 g/m2 (Range: 43 - 95)
RWT
0.52 (Range: <=0.42)

Aortic Root Measurements (Normal Ranges)
Sinus
2.9 cm (Range: 2.4 - 3.6)
ARA LOCKLEAR
2.8 cm

Doppler (Spectral and Color Flow)
Estimated Peak Systolic PA Pressure
Aortic valve area
2.01 cm2
Aortic valve mean gradient
Aortic valve peak gradient
Aortic valve peak velocity
1.5 m/s
Aortic valve velocity ratio
Aortic valve regurgitation pressure halftime
309 msec
Pulmonic valve peak gradient

Tech Notes:

jl

## 2020-07-16 ENCOUNTER — Encounter: Admit: 2020-07-16 | Discharge: 2020-07-16 | Payer: MEDICARE

## 2020-07-16 MED ORDER — LOSARTAN 100 MG PO TAB
ORAL_TABLET | Freq: Every day | 3 refills
Start: 2020-07-16 — End: ?

## 2020-07-16 MED ORDER — METOPROLOL SUCCINATE 25 MG PO TB24
ORAL_TABLET | Freq: Every day | 2 refills
Start: 2020-07-16 — End: ?

## 2020-10-25 ENCOUNTER — Encounter: Admit: 2020-10-25 | Discharge: 2020-10-25 | Payer: MEDICARE

## 2020-10-25 MED ORDER — KLOR-CON M20 20 MEQ PO TBTQ
ORAL_TABLET | Freq: Every day | 3 refills
Start: 2020-10-25 — End: ?

## 2020-12-26 ENCOUNTER — Encounter: Admit: 2020-12-26 | Discharge: 2020-12-26 | Payer: MEDICARE

## 2020-12-26 MED ORDER — FUROSEMIDE 20 MG PO TAB
ORAL_TABLET | 3 refills
Start: 2020-12-26 — End: ?

## 2021-03-10 ENCOUNTER — Encounter: Admit: 2021-03-10 | Discharge: 2021-03-10 | Payer: MEDICARE

## 2021-03-10 DIAGNOSIS — Z01810 Encounter for preprocedural cardiovascular examination: Secondary | ICD-10-CM

## 2021-03-10 DIAGNOSIS — E1159 Type 2 diabetes mellitus with other circulatory complications: Secondary | ICD-10-CM

## 2021-03-10 DIAGNOSIS — I25119 Atherosclerotic heart disease of native coronary artery with unspecified angina pectoris: Secondary | ICD-10-CM

## 2021-03-10 DIAGNOSIS — E785 Hyperlipidemia, unspecified: Secondary | ICD-10-CM

## 2021-03-10 DIAGNOSIS — M549 Dorsalgia, unspecified: Secondary | ICD-10-CM

## 2021-03-10 DIAGNOSIS — Z993 Dependence on wheelchair: Secondary | ICD-10-CM

## 2021-03-10 DIAGNOSIS — J439 Emphysema, unspecified: Secondary | ICD-10-CM

## 2021-03-10 DIAGNOSIS — D494 Neoplasm of unspecified behavior of bladder: Secondary | ICD-10-CM

## 2021-03-10 DIAGNOSIS — R413 Other amnesia: Secondary | ICD-10-CM

## 2021-03-10 DIAGNOSIS — I10 Essential (primary) hypertension: Secondary | ICD-10-CM

## 2021-03-10 DIAGNOSIS — E119 Type 2 diabetes mellitus without complications: Secondary | ICD-10-CM

## 2021-03-10 DIAGNOSIS — K439 Ventral hernia without obstruction or gangrene: Secondary | ICD-10-CM

## 2021-03-10 DIAGNOSIS — E782 Mixed hyperlipidemia: Secondary | ICD-10-CM

## 2021-03-10 DIAGNOSIS — Z9981 Dependence on supplemental oxygen: Secondary | ICD-10-CM

## 2021-03-10 DIAGNOSIS — Z951 Presence of aortocoronary bypass graft: Secondary | ICD-10-CM

## 2021-03-10 DIAGNOSIS — I2510 Atherosclerotic heart disease of native coronary artery without angina pectoris: Secondary | ICD-10-CM

## 2021-03-10 DIAGNOSIS — E669 Obesity, unspecified: Secondary | ICD-10-CM

## 2021-03-10 DIAGNOSIS — Z955 Presence of coronary angioplasty implant and graft: Secondary | ICD-10-CM

## 2021-03-10 DIAGNOSIS — J45909 Unspecified asthma, uncomplicated: Secondary | ICD-10-CM

## 2021-03-10 DIAGNOSIS — M1990 Unspecified osteoarthritis, unspecified site: Secondary | ICD-10-CM

## 2021-03-10 DIAGNOSIS — H547 Unspecified visual loss: Secondary | ICD-10-CM

## 2021-03-10 NOTE — Progress Notes
Date of Service: 03/10/2021    Amy Bowman is a 80 y.o. female.       HPI      Amy Bowman is an 80 year old female with a history of CAD, status post CABG 1999 (LIMA to LAD, SVG to obtuse marginal branch, SVG to PDA), history of anginal symptoms, abnormal perfusion imaging study performed in May 2020, status post left heart catheterization in June 2020, status post PCI to native left circumflex artery, at that time the LIMA was found to be atretic, 100% occluded SVG to the left PDA, patent SVG to obtuse marginal branch, patient also has a history of several orthopedic issues with subsequent motorized wheelchair dependence, COPD and oxygen dependent, diabetes mellitus type 2 requiring insulin, hypertension and hyperlipidemia.    Patient has not had any symptoms compatible with angina and has not taken sublingual nitroglycerin.  She does report an episode of chest pain that occurred few weeks ago and lasted approximately 10 seconds.    She also suffered a COVID-19 upper respiratory tract infection, she continued to feel unwell after that episode, she was diagnosed with pneumonia in March 2022 and was treated with antibiotics.    Patient is does have significant orthopedic issues, in the past she was supposed to go left knee replacement, she also has significant left shoulder pain and has not gone surgical intervention of the [facility] joint.    Due to multiple comorbidities her functional status is poor and she is dependent on a motorized wheelchair, she does use a walker at home.  Patient is significantly physically deconditioned.  Her BMI is 46.05 kg/m?Amy Bowman         Vitals:    03/10/21 1255   BP: 136/72   BP Source: Arm, Right Upper   Pulse: 70   SpO2: 95%   O2 Device: None (Room air)   PainSc: Zero   Weight: 107 kg (235 lb 12.8 oz)   Height: 152.4 cm (5')     Body mass index is 46.05 kg/m?Amy Bowman     Past Medical History  Patient Active Problem List    Diagnosis Date Noted   ? COVID-19 vaccine administered 03/09/2020   ? Preop cardiovascular exam 03/09/2020   ? Able to mobilize using indoor motorized wheelchair 03/09/2020   ? On home oxygen therapy 03/09/2020   ? H/O right coronary artery stent placement 03/13/2019   ? Able to mobilize using indoor motorized wheelchair 03/13/2019   ? Hospital discharge follow-up 03/13/2019   ? Coronary artery disease due to lipid rich plaque 01/29/2019   ? Abnormal thallium stress test 01/16/2019   ? 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 Preop cardiovascular exam 04/22/2015   ? Preoperative cardiovascular examination 11/26/2014   ? 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 Morbid obesity (HCC) 06/02/2009   ? Emphysema 06/02/2009   ? DJD (degenerative joint disease) 06/02/2009   ? 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 Abdominal wall hernia 06/02/2009   ? CAD (coronary artery disease)      CABG 1999 at [facility]:  LIMA > LAD, SVG to OM,  SVG to inferior OM> PDA  01/29/2019 cath   FINAL IMPRESSION:    1. Severe disease of the mid to distal dominant left circumflex.  2. Atretic left internal mammary artery to the diagonal graft,  100% occluded vein graft to the left posterior descending artery.  3. Patent vein graft to obtuse marginal 1.  4. Successful percutaneous coronary intervention of the native mid to distal left circumflex with Xience 3.0 x 28 mm and Xience Sierra 2.5 x 38 mm drug-eluting stent placed in an overlapping fashion, postdilated with 3.0 mm noncompliant balloon.  5. High normal left ventricular end-diastolic pressure.     ? Type II diabetes mellitus (HCC)    ? HLD (hyperlipidemia)    ? HTN (hypertension)    ? Near syncope      10/16/2013-[facility] Carotid Artery US-Impression:  Some minimal atherosclerotic plaque evident bilaterally in the carotid bulbs and origin of the internal carotid arteries that does not appear to be hemodynamically significant at this time with findings consistent with less than a 50% diameter stenosis left and right.           Review of Systems   Constitutional: Positive for malaise/fatigue.   HENT: Negative.    Eyes: Negative.    Cardiovascular: Negative.    Respiratory: Positive for shortness of breath.    Endocrine: Negative.    Hematologic/Lymphatic: Negative.    Skin: Negative.    Musculoskeletal: Negative.    Gastrointestinal: Negative.    Genitourinary: Negative.    Neurological: Negative.    Psychiatric/Behavioral: Negative.    Allergic/Immunologic: Negative.        Physical Exam  General Appearance: Morbidly obese, BMI 46.05 kg/m?, patient uses a motorized wheelchair, patient is on oxygen therapy  Skin: warm, moist, no ulcers or xanthomas  Eyes: conjunctivae and lids normal, pupils are equal and round  Lips & Oral Mucosa: no pallor or cyanosis  Neck Veins: neck veins are flat, neck veins are not distended  Chest Inspection: chest is normal in appearance  Respiratory Effort: breathing comfortably, no respiratory distress  Auscultation/Percussion: lungs clear to auscultation, no rales or rhonchi, no wheezing  Cardiac Rhythm: regular rhythm and normal rate  Cardiac Auscultation: S1, S2 normal, no rub, no gallop  Murmurs: no murmur  Carotid Arteries: normal carotid upstroke bilaterally, no bruit  Lower Extremity Edema: no lower extremity edema  Abdominal Exam: soft, non-tender, no masses, bowel sounds normal  Liver & Spleen: no organomegaly  Language and Memory: patient responsive and seems to comprehend information  Neurologic Exam: neurological assessment grossly intact    Cardiovascular Studies  Twelve-lead EKG demonstrates normal sinus rhythm, no ST segment T wave changes    Cardiovascular Health Factors  Vitals BP Readings from Last 3 Encounters:   03/10/21 136/72   03/09/20 136/78   06/05/19 (!) 158/78     Wt Readings from Last 3 Encounters:   03/10/21 107 kg (235 lb 12.8 oz)   03/09/20 106 kg (233 lb 9.6 oz)   06/05/19 108 kg (238 lb 3.2 oz)     BMI Readings from Last 3 Encounters:   03/10/21 46.05 kg/m?   03/09/20 45.62 kg/m?   06/05/19 46.52 kg/m?      Smoking Social History     Tobacco Use   Smoking Status Former Smoker   ? Packs/day: 3.00   ? Years: 40.00   ? Pack years: 120.00   ? Types: Cigarettes   ? Quit date: 07/30/1998   ? Years since quitting: 22.6   Smokeless Tobacco Former Neurosurgeon   ? Types: Chew   ? Quit date: 07/02/1997   Tobacco Comment    Quit 1999      Lipid Profile Cholesterol   Date  Value Ref Range Status   01/12/2020 147  Final     HDL   Date Value Ref Range Status   01/12/2020 40  Final     LDL   Date Value Ref Range Status   01/12/2020 72  Final     Triglycerides   Date Value Ref Range Status   01/12/2020 175 (H) <150 Final      Blood Sugar Hemoglobin A1C   Date Value Ref Range Status   01/12/2020 5.6  Final     Glucose   Date Value Ref Range Status   01/12/2020 92  Final   01/30/2019 106 (H) 70 - 100 MG/DL Final   16/05/9603 540 (H) 70 - 100 MG/DL Final     Glucose, POC   Date Value Ref Range Status   01/29/2019 136 (H) 70 - 100 MG/DL Final   98/06/9146 829 (H) 70 - 100 MG/DL Final   56/21/3086 578 (H) 70 - 100 MG/DL Final          Problems Addressed Today  Encounter Diagnoses   Name Primary?   ? Coronary artery disease involving native coronary artery of native heart with angina pectoris (HCC) Yes   ? Mixed hyperlipidemia    ? Primary hypertension    ? Hx of CABG    ? Abdominal wall hernia    ? Able to mobilize using indoor motorized wheelchair    ? H/O right coronary artery stent placement    ? Morbid obesity (HCC)    ? On home oxygen therapy    ? Type 2 diabetes mellitus with other circulatory complication, with long-term current use of insulin (HCC)    ? Preoperative cardiovascular examination    ? Bladder tumor        Assessment and Plan     Assessment:    1.  Coronary artery disease, status post CABG in 1999-at the time patient did receive LIMA to LAD, SVG to OM branch, SVG to PDA  2.  Left heart catheterization performed in June 2020 -- No current symptoms of angina and no nitroglycerin use  ? The LHC demonstrated: Atretic LIMA to diagonal graft, 100% occluded SVG to left PDA, patent SVG to obtuse marginal branch  ? Status post PCI to native mid/distal left circumflex with a drug-eluting stent  3.  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 Etiology of this is multifactorial and probably secondary to COPD, morbid obesity and overall body habitus  4.  Primary hypertension-under good control  5.  Hyperlipidemia-on statin therapy  6.  Multiple orthopedic issues- patient has significant pain of the left hip and left shoulder, she would like to undergo surgical intervention if considered to be a viable candidate for orthopedic surgery  7.  Multifactorial poor functional status  8.  Morbid obesity-patient's BMI is 46.05 kg/m?  She has been able to lose approximately 40 pounds  9.  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    Plan:    1.  No changes in her current medications  2.  From a cardiac standpoint this patient does not have any contraindication to undergo general anesthesia and orthopedic procedures if she will be deemed to be a viable candidate for surgical intervention.  Patient it is high risk due to multiple comorbidities that consists of: Chronic hypoxic respiratory failure,  morbid obesity, and overall poor functional status, however she does not have a complete contraindication to proceeding.  Therefore risks and benefits would have to be discussed with the anesthesiologist and the operating orthopedic surgeon.  3.  Follow-up office visit in approximately 6 to 8 months.       Total Time Today was 40 minutes in the following activities: Preparing to see the patient, Obtaining and/or reviewing separately obtained history, Performing a medically appropriate examination and/or evaluation, Counseling and educating the patient/family/caregiver, Ordering medications, tests, or procedures, Referring and communication with other health care professionals (when not separately reported), Documenting clinical information in the electronic or other health record, Independently interpreting results (not separately reported) and communicating results to the patient/family/caregiver and Care coordination (not separately reported)           Current Medications (including today's revisions)  ? acetaminophen (TYLENOL) 500 mg tablet Take 1,000 mg by mouth twice daily as needed. Max of 4,000 mg of acetaminophen in 24 hours.    ? albuterol (VENTOLIN HFA, PROAIR HFA) 90 mcg/Actuation IN inhaler Inhale 2 puffs by mouth into the lungs at bedtime daily.   ? aspirin EC 81 mg PO tablet Take 1 Tab by mouth Daily.   ? atorvastatin (LIPITOR) 40 mg tablet TAKE ONE TABLET BY MOUTH DAILY. INDICATIONS: HIGH CHOLESTEROL AND HIGH TRIGLYCERIDES   ? BIOTIN PO Take 1 tablet by mouth daily.   ? fluticasone (FLONASE) 50 mcg/actuation nasal spray Apply 1 spray to each nostril as directed daily.   ? furosemide (LASIX) 20 mg tablet TAKE 1 TABLET BY MOUTH IN THE MORNING FOR VISIBLE WATER RETENTION   ? insulin lispro(+) 75/25 (HUMALOG MIX 75/25) 100 unit/mL (75/25) SC Susp Inject  under the skin as Needed. Sliding scale   ? KLOR-CON M20 20 mEq tablet TAKE 1 TABLET BY MOUTH EVERY DAY WITH MEAL AND FULL GLASS OF WATER   ? losartan (COZAAR) 100 mg tablet TAKE 1 TABLET BY MOUTH EVERY DAY   ? metFORMIN (GLUCOPHAGE) 1,000 mg tablet Take one tablet by mouth twice daily with meals. Resume on February 02, 2019 (Patient taking differently: Take 1,000 mg by mouth daily. Resume on February 02, 2019)   ? metoprolol XL (TOPROL XL) 25 mg extended release tablet TAKE 1 TABLET BY MOUTH EVERY DAY   ? naproxen (NAPROSYN) 500 mg PO tablet Take 1 tablet by mouth daily.   ? polyethylene glycol 3350 (MIRALAX) 17 g packet Take 17 g by mouth as Needed.   ? zafirlukast(+) (ACCOLATE) 20 mg PO tablet Take 1 Tab by mouth Twice Daily.

## 2021-03-27 ENCOUNTER — Encounter: Admit: 2021-03-27 | Discharge: 2021-03-27 | Payer: MEDICARE

## 2021-03-27 MED ORDER — ATORVASTATIN 40 MG PO TAB
40 mg | ORAL_TABLET | Freq: Every day | ORAL | 3 refills
Start: 2021-03-27 — End: ?

## 2021-04-13 ENCOUNTER — Encounter: Admit: 2021-04-13 | Discharge: 2021-04-13 | Payer: MEDICARE

## 2021-04-13 MED ORDER — KLOR-CON M20 20 MEQ PO TBTQ
ORAL_TABLET | Freq: Every day | ORAL | 3 refills | 30.00000 days | Status: AC
Start: 2021-04-13 — End: ?

## 2021-06-24 ENCOUNTER — Encounter: Admit: 2021-06-24 | Discharge: 2021-06-24 | Payer: MEDICARE

## 2021-06-24 MED ORDER — LOSARTAN 100 MG PO TAB
ORAL_TABLET | Freq: Every day | ORAL | 3 refills | 30.00000 days | Status: AC
Start: 2021-06-24 — End: ?

## 2021-07-12 ENCOUNTER — Encounter: Admit: 2021-07-12 | Discharge: 2021-07-12 | Payer: MEDICARE

## 2021-07-12 MED ORDER — METOPROLOL SUCCINATE 25 MG PO TB24
ORAL_TABLET | Freq: Every day | ORAL | 3 refills | 90.00000 days | Status: AC
Start: 2021-07-12 — End: ?

## 2021-11-11 ENCOUNTER — Encounter: Admit: 2021-11-11 | Discharge: 2021-11-11 | Payer: MEDICARE

## 2021-11-11 NOTE — Telephone Encounter
Received a call from the patient requesting clearance and to hold aspirin for surgery. Patient had a hip surgery in January and is now needing to have another surgery due to hematoma at site.     Discussed patient with [facility] in clinic. Patient is cleared to proceed with surgery and may hold aspirin as needed.

## 2021-11-11 NOTE — Telephone Encounter
Telephone note faxed to Dr. Benedetto Goad office at [phone]

## 2021-11-14 ENCOUNTER — Encounter: Admit: 2021-11-14 | Discharge: 2021-11-14 | Payer: MEDICARE

## 2021-11-14 DIAGNOSIS — I25119 Atherosclerotic heart disease of native coronary artery with unspecified angina pectoris: Secondary | ICD-10-CM

## 2021-11-14 DIAGNOSIS — E782 Mixed hyperlipidemia: Secondary | ICD-10-CM

## 2021-11-14 DIAGNOSIS — I10 Essential (primary) hypertension: Secondary | ICD-10-CM

## 2021-11-14 MED ORDER — FUROSEMIDE 20 MG PO TAB
ORAL_TABLET | ORAL | 3 refills | 90.00000 days | Status: AC
Start: 2021-11-14 — End: ?

## 2021-12-26 ENCOUNTER — Encounter: Admit: 2021-12-26 | Discharge: 2021-12-26 | Payer: MEDICARE

## 2021-12-27 ENCOUNTER — Encounter: Admit: 2021-12-27 | Discharge: 2021-12-27 | Payer: MEDICARE

## 2021-12-27 DIAGNOSIS — Z9981 Dependence on supplemental oxygen: Secondary | ICD-10-CM

## 2021-12-27 DIAGNOSIS — Z955 Presence of coronary angioplasty implant and graft: Secondary | ICD-10-CM

## 2021-12-27 DIAGNOSIS — R9439 Abnormal result of other cardiovascular function study: Secondary | ICD-10-CM

## 2021-12-27 DIAGNOSIS — R413 Other amnesia: Secondary | ICD-10-CM

## 2021-12-27 DIAGNOSIS — I10 Essential (primary) hypertension: Secondary | ICD-10-CM

## 2021-12-27 DIAGNOSIS — K439 Ventral hernia without obstruction or gangrene: Secondary | ICD-10-CM

## 2021-12-27 DIAGNOSIS — I2510 Atherosclerotic heart disease of native coronary artery without angina pectoris: Secondary | ICD-10-CM

## 2021-12-27 DIAGNOSIS — I25119 Atherosclerotic heart disease of native coronary artery with unspecified angina pectoris: Secondary | ICD-10-CM

## 2021-12-27 DIAGNOSIS — D494 Neoplasm of unspecified behavior of bladder: Secondary | ICD-10-CM

## 2021-12-27 DIAGNOSIS — Z951 Presence of aortocoronary bypass graft: Secondary | ICD-10-CM

## 2021-12-27 DIAGNOSIS — M1990 Unspecified osteoarthritis, unspecified site: Secondary | ICD-10-CM

## 2021-12-27 DIAGNOSIS — E782 Mixed hyperlipidemia: Secondary | ICD-10-CM

## 2021-12-27 DIAGNOSIS — E669 Obesity, unspecified: Secondary | ICD-10-CM

## 2021-12-27 DIAGNOSIS — D497 Neoplasm of unspecified behavior of endocrine glands and other parts of nervous system: Secondary | ICD-10-CM

## 2021-12-27 DIAGNOSIS — J439 Emphysema, unspecified: Secondary | ICD-10-CM

## 2021-12-27 DIAGNOSIS — J45909 Unspecified asthma, uncomplicated: Secondary | ICD-10-CM

## 2021-12-27 DIAGNOSIS — M159 Polyosteoarthritis, unspecified: Secondary | ICD-10-CM

## 2021-12-27 DIAGNOSIS — E119 Type 2 diabetes mellitus without complications: Secondary | ICD-10-CM

## 2021-12-27 DIAGNOSIS — E1159 Type 2 diabetes mellitus with other circulatory complications: Secondary | ICD-10-CM

## 2021-12-27 DIAGNOSIS — M549 Dorsalgia, unspecified: Secondary | ICD-10-CM

## 2021-12-27 DIAGNOSIS — H547 Unspecified visual loss: Secondary | ICD-10-CM

## 2021-12-27 DIAGNOSIS — E785 Hyperlipidemia, unspecified: Secondary | ICD-10-CM

## 2021-12-27 DIAGNOSIS — Z993 Dependence on wheelchair: Secondary | ICD-10-CM

## 2021-12-27 MED ORDER — FUROSEMIDE 20 MG PO TAB
40 mg | ORAL_TABLET | Freq: Every day | ORAL | 3 refills | 90.00000 days | Status: AC
Start: 2021-12-27 — End: ?

## 2021-12-27 NOTE — Progress Notes
Date of Service: 12/27/2021    Amy Bowman is a 81 y.o. female.       HPI     Amy Bowman is a 81 y.o. white female with stable CAD, status post CABG 1999 (LIMA to LAD, SVG to OM branch, SVG to PDA), abnormal stress test in May 2020, status post LHC in June 2020, status post PCI to native left circumflex artery, at that time LIMA was found to be atretic, 100% occluded SVG to left PDA, patent SVG to OM branch, chronic respiratory failure oxygen dependent at 2-3 L/min, COPD, diabetes mellitus type 2 requiring insulin, hypertension, hyperlipidemia, decreased mobility, patient does use a motorized wheelchair, status post several orthopedic interventions.    Patient did undergo left hip surgery in January 2023, she did develop hematoma that required evacuation in April 2023.    She also developed respiratory failure, she was admitted in [facility] on 11/25/2021, she was admitted and treated for hyperkalemia, AKI, hyponatremia.    At present time patient is weak, short of breath exacerbated by physical activity, she continues to use oxygen at 2-3 L/min, she is profoundly physically deconditioned.    She has not experienced any symptoms of chest pain, no angina and no sublingual nitroglycerin use.         Vitals:    12/27/21 1605   BP: (!) 157/82   BP Source: Arm, Left Lower   Pulse: 80   SpO2: 94%   O2 Device: Nasal cannula   O2 Liter Flow: 2 Lpm   PainSc: Zero   Weight: 107 kg (236 lb)   Height: 152.4 cm (5')     Body mass index is 46.09 kg/m?Marland Kitchen     Past Medical History  Patient Active Problem List    Diagnosis Date Noted   ? COVID-19 vaccine administered 03/09/2020   ? Preop cardiovascular exam 03/09/2020   ? Able to mobilize using indoor motorized wheelchair 03/09/2020   ? On home oxygen therapy 03/09/2020   ? Presence of drug coated stent in left circumflex coronary artery 03/13/2019   ? Able to mobilize using indoor motorized wheelchair 03/13/2019   ? Hospital discharge follow-up 03/13/2019   ? Coronary artery disease due to lipid rich plaque 01/29/2019   ? Abnormal thallium stress test 01/16/2019   ? 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 Preop cardiovascular exam 04/22/2015   ? Preoperative cardiovascular examination 11/26/2014   ? 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 Morbid obesity (HCC) 06/02/2009   ? Emphysema 06/02/2009   ? DJD (degenerative joint disease) 06/02/2009   ? 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 Abdominal wall hernia 06/02/2009   ? CAD (coronary artery disease)      CABG 1999 at [facility]:  LIMA > LAD, SVG to OM,  SVG to inferior OM> PDA  01/29/2019 cath   FINAL IMPRESSION:    1. Severe disease of the mid to distal dominant left circumflex.  2. Atretic left internal mammary artery to the diagonal graft, 100% occluded vein graft to the left posterior descending artery.  3. Patent vein graft to obtuse marginal 1.  4. Successful percutaneous coronary intervention of the native mid to distal left circumflex with Xience 3.0 x 28 mm and Xience Sierra 2.5 x 38 mm drug-eluting stent placed in an overlapping fashion,  postdilated with 3.0 mm noncompliant balloon.  5. High normal left ventricular end-diastolic pressure.     ? Type II diabetes mellitus (HCC)    ? HLD (hyperlipidemia)    ? HTN (hypertension)    ? Near syncope      10/16/2013-[facility] Carotid Artery US-Impression:  Some minimal atherosclerotic plaque evident bilaterally in the carotid bulbs and origin of the internal carotid arteries that does not appear to be hemodynamically significant at this time with findings consistent with less than a 50% diameter stenosis left and right.           Review of Systems   Constitutional: Negative.   HENT: Negative.    Eyes: Negative.    Cardiovascular: Negative.    Respiratory: Negative.    Endocrine: Negative.    Hematologic/Lymphatic: Negative.    Skin: Negative. Musculoskeletal: Negative.    Gastrointestinal: Negative.    Genitourinary: Negative.    Neurological: Negative.    Psychiatric/Behavioral: Negative.    Allergic/Immunologic: Negative.        Physical Exam  General Appearance:?Morbidly obese, BMI 46.05 kg/m?, patient uses a motorized wheelchair, patient is on oxygen therapy  Skin: warm, moist, no ulcers or xanthomas  Eyes: conjunctivae and lids normal, pupils are equal and round  Lips & Oral Mucosa: no pallor or cyanosis  Neck Veins: neck veins are flat, neck veins are not distended  Chest Inspection: chest is normal in appearance  Respiratory Effort: breathing comfortably, no respiratory distress  Auscultation/Percussion: lungs clear to auscultation, no rales or rhonchi, no wheezing  Cardiac Rhythm: regular rhythm and normal rate  Cardiac Auscultation: S1, S2 normal, no rub, no gallop  Murmurs: 2/6 systolic murmur at the left sternal border  Carotid Arteries: normal carotid upstroke bilaterally, no bruit  Lower Extremity Edema: no lower extremity edema  Abdominal Exam: soft, non-tender, no masses, bowel sounds normal  Liver & Spleen: no organomegaly  Language and Memory: patient responsive and seems to comprehend information  Neurologic Exam: neurological assessment grossly intact    Cardiovascular Studies      Cardiovascular Health Factors  Vitals BP Readings from Last 3 Encounters:   12/27/21 (!) 157/82   03/10/21 136/72   03/09/20 136/78     Wt Readings from Last 3 Encounters:   12/27/21 107 kg (236 lb)   03/10/21 107 kg (235 lb 12.8 oz)   03/09/20 106 kg (233 lb 9.6 oz)     BMI Readings from Last 3 Encounters:   12/27/21 46.09 kg/m?   03/10/21 46.05 kg/m?   03/09/20 45.62 kg/m?      Smoking Social History     Tobacco Use   Smoking Status Former   ? Packs/day: 3.00   ? Years: 40.00   ? Pack years: 120.00   ? Types: Cigarettes   ? Quit date: 07/30/1998   ? Years since quitting: 23.4   Smokeless Tobacco Former   ? Types: Chew   ? Quit date: 07/02/1997   Tobacco Comments    Quit 1999      Lipid Profile Cholesterol   Date Value Ref Range Status   12/30/2020 141  Final     HDL   Date Value Ref Range Status   12/30/2020 38 (L) >40 Final     LDL   Date Value Ref Range Status   12/30/2020 74  Final     Triglycerides   Date Value Ref Range Status   12/30/2020 147  Final      Blood Sugar  Hemoglobin A1C   Date Value Ref Range Status   01/12/2020 5.6  Final     Glucose   Date Value Ref Range Status   12/12/2021 190 (H) 70 - 105 Final   02/02/2021 110 (H) 70 - 105 Final   12/30/2020 115 (H) 70 - 105 Final     Glucose, POC   Date Value Ref Range Status   01/29/2019 136 (H) 70 - 100 MG/DL Final   16/05/9603 540 (H) 70 - 100 MG/DL Final   98/06/9146 829 (H) 70 - 100 MG/DL Final          Problems Addressed Today  Encounter Diagnoses   Name Primary?   ? Coronary artery disease involving native coronary artery of native heart with angina pectoris (HCC) Yes   ? Mixed hyperlipidemia    ? Primary hypertension    ? Hx of CABG    ? Coronary artery disease due to lipid rich plaque    ? Type 2 diabetes mellitus with other circulatory complication, with long-term current use of insulin (HCC)    ? Abdominal wall hernia    ? Morbid obesity (HCC)    ? Spinal cord tumor    ? Primary osteoarthritis involving multiple joints    ? Bladder tumor    ? Abnormal thallium stress test    ? Presence of drug coated stent in left circumflex coronary artery    ? Able to mobilize using indoor motorized wheelchair    ? On home oxygen therapy    ? H/O right coronary artery stent placement        Assessment and Plan      Assessment:    1.  Chronic hypoxic respiratory failure due to COPD-recently admitted and treated at [facility]  2.  Oxygen dependent 2-3 L/min  3.  Coronary artery disease-no symptoms of angina and no sublingual nitroglycerin use  4.  Status post CABG in 1999-LIMA to LAD, SVG to OM branch, SVG to PDA  5.  Status post LHC in June 2020-atretic LIMA to diagonal graft, 100% occluded SVG to PDA, patent SVG to OM branch  6.  Status post PCI to native mid/distal left circumflex with a DES  7.  Morbid obesity-BMI 46.09 kg/m?, patient is motorized wheelchair dependent  8.  Primary hypertension- suboptimally controlled  9.  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  Multiple orthopedic issues status post left knee surgery complicated by hematoma that required surgical evacuation  10.  History of COVID-19 syndrome/upper respiratory tract infection in March 2022    Plan:    1.  Continue furosemide 40 mg p.o. daily  2.  I asked the patient to eat 2 bananas a day, she would like to do this rather than take potassium pills  3.  Evaluation with a 2D echo Doppler study-we will call the patient with the results  4.  Follow a strict low-sodium diet  5.  Follow-up office visit in 10 to 12 months      Total Time Today was 40 minutes in the following activities: Preparing to see the patient, Obtaining and/or reviewing separately obtained history, Performing a medically appropriate examination and/or evaluation, Counseling and educating the patient/family/caregiver, Ordering medications, tests, or procedures, Referring and communication with other health care professionals (when not separately reported), Documenting clinical information in the electronic or other health record, Independently interpreting results (not separately reported) and communicating results to the patient/family/caregiver and Care coordination (not separately  reported)         Current Medications (including today's revisions)  ? acetaminophen (TYLENOL) 500 mg tablet Take two tablets by mouth twice daily as needed. Max of 4,000 mg of acetaminophen in 24 hours.   ? albuterol (VENTOLIN HFA, PROAIR HFA) 90 mcg/Actuation IN inhaler Inhale two puffs by mouth into the lungs at bedtime daily.   ? aspirin EC 81 mg PO tablet Take one tablet by mouth daily.   ? atorvastatin (LIPITOR) 40 mg tablet TAKE ONE TABLET BY MOUTH DAILY. INDICATIONS: HIGH CHOLESTEROL AND HIGH TRIGLYCERIDES   ? BIOTIN PO Take 1 tablet by mouth daily.   ? fluticasone (FLONASE) 50 mcg/actuation nasal spray Apply one spray to each nostril as directed daily.   ? furosemide (LASIX) 20 mg tablet TAKE 1 TABLET BY MOUTH IN THE MORNING FOR VISIBLE WATER RETENTION (Patient taking differently: Take two tablets by mouth daily.)   ? insulin lispro(+) 75/25 (HUMALOG MIX 75/25) 100 unit/mL (75/25) SC Susp Inject  under the skin as Needed. Sliding scale   ? KLOR-CON M20 20 mEq tablet TAKE 1 TABLET BY MOUTH EVERY DAY WITH MEAL AND FULL GLASS OF WATER   ? losartan (COZAAR) 100 mg tablet TAKE 1 TABLET BY MOUTH EVERY DAY   ? metFORMIN (GLUCOPHAGE) 1,000 mg tablet Take one tablet by mouth twice daily with meals. Resume on February 02, 2019 (Patient taking differently: Take one tablet by mouth daily. Resume on February 02, 2019)   ? metoprolol succinate XL (TOPROL XL) 25 mg extended release tablet TAKE 1 TABLET BY MOUTH EVERY DAY   ? naproxen (NAPROSYN) 500 mg PO tablet Take one tablet by mouth daily. one and one half   ? polyethylene glycol 3350 (MIRALAX) 17 g packet Take one packet by mouth as Needed.   ? zafirlukast(+) (ACCOLATE) 20 mg PO tablet Take one tablet by mouth twice daily.

## 2022-01-09 ENCOUNTER — Encounter: Admit: 2022-01-09 | Discharge: 2022-01-09 | Payer: MEDICARE

## 2022-01-09 MED ORDER — ATORVASTATIN 40 MG PO TAB
40 mg | ORAL_TABLET | Freq: Every day | ORAL | 3 refills
Start: 2022-01-09 — End: ?

## 2022-01-12 ENCOUNTER — Ambulatory Visit: Admit: 2022-01-12 | Discharge: 2022-01-12 | Payer: MEDICARE

## 2022-01-12 ENCOUNTER — Encounter: Admit: 2022-01-12 | Discharge: 2022-01-12 | Payer: MEDICARE

## 2022-01-12 DIAGNOSIS — I2510 Atherosclerotic heart disease of native coronary artery without angina pectoris: Secondary | ICD-10-CM

## 2022-01-12 DIAGNOSIS — E785 Hyperlipidemia, unspecified: Secondary | ICD-10-CM

## 2022-01-12 DIAGNOSIS — Z951 Presence of aortocoronary bypass graft: Secondary | ICD-10-CM

## 2022-01-12 DIAGNOSIS — I10 Essential (primary) hypertension: Secondary | ICD-10-CM

## 2022-01-17 ENCOUNTER — Encounter: Admit: 2022-01-17 | Discharge: 2022-01-17 | Payer: MEDICARE

## 2022-01-17 NOTE — Telephone Encounter
-----   Message from Vertell Novak, MD sent at 01/15/2022  9:24 PM CDT -----  Please call the patient to let her know that the echocardiogram showed normal heart function.    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  We will continue to monitor this.    I can see her back in the office in 1 year from the last office visit.    Thank you      ----- Message -----  From: Samuel Jester, MD  Sent: 01/13/2022   4:33 PM CDT  To: Vertell Novak, MD

## 2022-05-04 ENCOUNTER — Encounter: Admit: 2022-05-04 | Discharge: 2022-05-04 | Payer: MEDICARE

## 2022-05-04 NOTE — Telephone Encounter
Patient called requesting an appointment with [facility] due to increased shortness of breath. Patient states her home oxygen needs have increased from 2lpm to 5 lpm. Patient has had recent ov with PCP and has an upcoming appointment with pulmonary. She denies any other cardiac symptoms at this time.

## 2022-05-22 ENCOUNTER — Encounter: Admit: 2022-05-22 | Discharge: 2022-05-22 | Payer: MEDICARE

## 2022-05-25 ENCOUNTER — Encounter: Admit: 2022-05-25 | Discharge: 2022-05-25 | Payer: MEDICARE

## 2022-05-25 ENCOUNTER — Inpatient Hospital Stay: Admit: 2022-05-25 | Discharge: 2022-05-25 | Payer: MEDICARE

## 2022-05-25 ENCOUNTER — Inpatient Hospital Stay: Admit: 2022-05-25 | Payer: MEDICARE

## 2022-05-25 DIAGNOSIS — R55 Syncope and collapse: Secondary | ICD-10-CM

## 2022-05-25 DIAGNOSIS — E1159 Type 2 diabetes mellitus with other circulatory complications: Secondary | ICD-10-CM

## 2022-05-25 DIAGNOSIS — H547 Unspecified visual loss: Secondary | ICD-10-CM

## 2022-05-25 DIAGNOSIS — Z993 Dependence on wheelchair: Secondary | ICD-10-CM

## 2022-05-25 DIAGNOSIS — J439 Emphysema, unspecified: Secondary | ICD-10-CM

## 2022-05-25 DIAGNOSIS — I10 Essential (primary) hypertension: Secondary | ICD-10-CM

## 2022-05-25 DIAGNOSIS — E782 Mixed hyperlipidemia: Secondary | ICD-10-CM

## 2022-05-25 DIAGNOSIS — R413 Other amnesia: Secondary | ICD-10-CM

## 2022-05-25 DIAGNOSIS — E119 Type 2 diabetes mellitus without complications: Secondary | ICD-10-CM

## 2022-05-25 DIAGNOSIS — Z951 Presence of aortocoronary bypass graft: Secondary | ICD-10-CM

## 2022-05-25 DIAGNOSIS — K439 Ventral hernia without obstruction or gangrene: Secondary | ICD-10-CM

## 2022-05-25 DIAGNOSIS — M1990 Unspecified osteoarthritis, unspecified site: Secondary | ICD-10-CM

## 2022-05-25 DIAGNOSIS — I2510 Atherosclerotic heart disease of native coronary artery without angina pectoris: Secondary | ICD-10-CM

## 2022-05-25 DIAGNOSIS — E785 Hyperlipidemia, unspecified: Secondary | ICD-10-CM

## 2022-05-25 DIAGNOSIS — Z136 Encounter for screening for cardiovascular disorders: Secondary | ICD-10-CM

## 2022-05-25 DIAGNOSIS — I25119 Atherosclerotic heart disease of native coronary artery with unspecified angina pectoris: Secondary | ICD-10-CM

## 2022-05-25 DIAGNOSIS — J9601 Acute respiratory failure with hypoxia: Secondary | ICD-10-CM

## 2022-05-25 DIAGNOSIS — M159 Polyosteoarthritis, unspecified: Secondary | ICD-10-CM

## 2022-05-25 DIAGNOSIS — J45909 Unspecified asthma, uncomplicated: Secondary | ICD-10-CM

## 2022-05-25 DIAGNOSIS — M549 Dorsalgia, unspecified: Secondary | ICD-10-CM

## 2022-05-25 DIAGNOSIS — Z9981 Dependence on supplemental oxygen: Secondary | ICD-10-CM

## 2022-05-25 DIAGNOSIS — E669 Obesity, unspecified: Secondary | ICD-10-CM

## 2022-05-25 LAB — BLOOD GASES, PERIPHERAL VENOUS
BASE EXCESS-VENOUS: 5.9 MMOL/L
BICARB, VENOUS(CAL): 28 MMOL/L
O2 SAT, VEN(CALC.): 32 % — ABNORMAL LOW (ref 55–71)
PCO2-VENOUS: 63 mmHg — ABNORMAL HIGH (ref 36–50)
PH-VENOUS: 7.3 (ref 7.30–7.40)
PO2-VENOUS: 26 mmHg — ABNORMAL LOW (ref 33–48)

## 2022-05-25 LAB — RVP VIRAL PANEL PCR

## 2022-05-25 LAB — COMPREHENSIVE METABOLIC PANEL: SODIUM: 133 MMOL/L — ABNORMAL LOW (ref 137–147)

## 2022-05-25 LAB — CBC
RBC COUNT: 4.2 M/UL (ref 4.0–5.0)
WBC COUNT: 7.2 K/UL (ref 4.5–11.0)

## 2022-05-25 LAB — HIGH SENSITIVITY TROPONIN I 0 HOUR: HIGH SENSITIVITY TROPONIN I 0 HOUR: 13 ng/L — ABNORMAL HIGH (ref <12)

## 2022-05-25 LAB — BNP (B-TYPE NATRIURETIC PEPTI): BNP: 263 pg/mL — ABNORMAL HIGH (ref 0–100)

## 2022-05-25 LAB — HIGH SENSITIVITY TROPONIN I 4 HR: HI SEN TNI 4 HR: 9 ng/L (ref <12)

## 2022-05-25 LAB — POC GLUCOSE: POC GLUCOSE: 96 mg/dL (ref 70–100)

## 2022-05-25 LAB — HIGH SENSITIVITY TROPONIN I 2 HOUR: HIGH SENSITIVITY TROPONIN I 2 HOUR: 11 ng/L — ABNORMAL HIGH (ref <12)

## 2022-05-25 MED ORDER — ACETAMINOPHEN 325 MG PO TAB
650 mg | ORAL | 0 refills | Status: AC | PRN
Start: 2022-05-25 — End: ?

## 2022-05-25 MED ORDER — METOPROLOL SUCCINATE 25 MG PO TB24
25 mg | Freq: Every day | ORAL | 0 refills | Status: AC
Start: 2022-05-25 — End: ?
  Administered 2022-05-26 – 2022-06-01 (×6): 25 mg via ORAL

## 2022-05-25 MED ORDER — OXYCODONE 5 MG PO TAB
5-15 mg | ORAL | 0 refills | Status: AC | PRN
Start: 2022-05-25 — End: ?
  Administered 2022-05-26 – 2022-05-29 (×11): 15 mg via ORAL

## 2022-05-25 MED ORDER — IPRATROPIUM-ALBUTEROL 0.5 MG-3 MG(2.5 MG BASE)/3 ML IN NEBU
3 mL | RESPIRATORY_TRACT | 0 refills | Status: DC | PRN
Start: 2022-05-25 — End: 2022-05-25

## 2022-05-25 MED ORDER — ONDANSETRON 4 MG PO TBDI
4 mg | ORAL | 0 refills | Status: AC | PRN
Start: 2022-05-25 — End: ?

## 2022-05-25 MED ORDER — GABAPENTIN 100 MG PO CAP
100 mg | Freq: Two times a day (BID) | ORAL | 0 refills | Status: AC
Start: 2022-05-25 — End: ?
  Administered 2022-05-26 – 2022-05-29 (×6): 100 mg via ORAL

## 2022-05-25 MED ORDER — LOSARTAN 50 MG PO TAB
100 mg | Freq: Every day | ORAL | 0 refills | Status: AC
Start: 2022-05-25 — End: ?
  Administered 2022-05-26 – 2022-05-30 (×5): 100 mg via ORAL

## 2022-05-25 MED ORDER — SENNOSIDES-DOCUSATE SODIUM 8.6-50 MG PO TAB
1 | Freq: Every day | ORAL | 0 refills | Status: AC | PRN
Start: 2022-05-25 — End: ?
  Administered 2022-05-26 – 2022-05-28 (×2): 1 via ORAL

## 2022-05-25 MED ORDER — IPRATROPIUM-ALBUTEROL 0.5 MG-3 MG(2.5 MG BASE)/3 ML IN NEBU
3 mL | RESPIRATORY_TRACT | 0 refills | Status: AC | PRN
Start: 2022-05-25 — End: ?
  Administered 2022-05-26 – 2022-05-28 (×8): 3 mL via RESPIRATORY_TRACT

## 2022-05-25 MED ORDER — FUROSEMIDE 20 MG PO TAB
20 mg | Freq: Every day | ORAL | 0 refills | Status: AC
Start: 2022-05-25 — End: ?
  Administered 2022-05-26 – 2022-05-27 (×2): 20 mg via ORAL

## 2022-05-25 MED ORDER — MONTELUKAST 10 MG PO TAB
10 mg | Freq: Every evening | ORAL | 0 refills | Status: AC
Start: 2022-05-25 — End: ?
  Administered 2022-05-27 – 2022-06-01 (×6): 10 mg via ORAL

## 2022-05-25 MED ORDER — ATORVASTATIN 40 MG PO TAB
40 mg | Freq: Every day | ORAL | 0 refills | Status: AC
Start: 2022-05-25 — End: ?
  Administered 2022-05-26 – 2022-06-01 (×6): 40 mg via ORAL

## 2022-05-25 MED ORDER — FLUTICASONE PROPIONATE 50 MCG/ACTUATION NA SPSN
1 | Freq: Every day | NASAL | 0 refills | Status: AC
Start: 2022-05-25 — End: ?
  Administered 2022-05-26: 13:00:00 1 via NASAL

## 2022-05-25 MED ORDER — MELATONIN 5 MG PO TAB
5 mg | Freq: Every evening | ORAL | 0 refills | Status: AC | PRN
Start: 2022-05-25 — End: ?

## 2022-05-25 MED ORDER — ASPIRIN 81 MG PO TBEC
81 mg | Freq: Every day | ORAL | 0 refills | Status: AC
Start: 2022-05-25 — End: ?
  Administered 2022-05-26 – 2022-06-01 (×6): 81 mg via ORAL

## 2022-05-25 MED ORDER — PREDNISONE 20 MG PO TAB
40 mg | Freq: Every day | ORAL | 0 refills | Status: AC
Start: 2022-05-25 — End: ?
  Administered 2022-05-26 – 2022-05-29 (×4): 40 mg via ORAL

## 2022-05-25 MED ORDER — ALBUTEROL SULFATE 90 MCG/ACTUATION IN HFAA
2 | RESPIRATORY_TRACT | 0 refills | Status: AC | PRN
Start: 2022-05-25 — End: ?

## 2022-05-25 MED ORDER — POLYETHYLENE GLYCOL 3350 17 GRAM PO PWPK
1 | Freq: Every day | ORAL | 0 refills | Status: AC | PRN
Start: 2022-05-25 — End: ?
  Administered 2022-05-26 – 2022-05-27 (×2): 17 g via ORAL

## 2022-05-25 MED ORDER — ENOXAPARIN 40 MG/0.4 ML SC SYRG
40 mg | Freq: Every day | SUBCUTANEOUS | 0 refills | Status: AC
Start: 2022-05-25 — End: ?

## 2022-05-25 MED ORDER — ONDANSETRON HCL (PF) 4 MG/2 ML IJ SOLN
4 mg | INTRAVENOUS | 0 refills | Status: AC | PRN
Start: 2022-05-25 — End: ?

## 2022-05-25 NOTE — Progress Notes
Patient profile, education, and care plan reviewed/updated. Call light within reach.

## 2022-05-25 NOTE — Progress Notes
RT Adult Assessment Note    NAME:Amy Bowman             MRN: [id number]             DOB:October 24, 1940          AGE: 81 y.o.  ADMISSION DATE: 05/25/2022             DAYS ADMITTED: LOS: 0 days    RT Treatment Plan:  Protocol Plan: Medications  Albuterol: Nebulizer Q4h while awake & PRN  Ipratropium: Nebulizer Q4h while awake & PRN    Protocol Plan: Procedures  PAP: Place a nursing order for "IS Q1h While Awake" for any of Lung Expansion indicators  Oxygen/Humidity: O2 to keep SpO2 > 92%, if on room air for > 24 hours and no other RT modalities are required, then D/C protocol  SpO2: BID & PRN  Comment: 5-6lpm baseline O2    Additional Comments:  Impressions of the patient: pt resting in bed, no S&S of respiratory distress  Intervention(s)/outcome(s): evaluated for RT needs, therapies as indicated above  Patient education that was completed: NA  Recommendations to the care team: NA    Vital Signs:  Pulse: 79  RR: 18 PER MINUTE  SpO2: 92 %  O2 Device: Nasal cannula  Liter Flow: 5 Lpm  O2%:    Breath Sounds: Clear (Implies normal);Decreased;Fine crackles  Respiratory Effort: Unlabored

## 2022-05-25 NOTE — H&P (View-Only)
Name:  Amy Bowman                                             MRN:  [id number]   Admission Date:  05/25/2022                     Assessment/Plan:      COPD exacerbation   Chronic hypoxic respiratory failure  - Pulmonary function test performed in [facility] on 04/17/2022 demonstrated moderately severe obstructive lung disease   - She  usually at home does use 2 L/min and she had to increase the oxygen to 6 L/min  - chest xray: pending   - VBG: Co2 63  - continue PTA inhalers   - started on steroids  - rvp pending     Heart failure preserved ejection fraction   - TTE 12/2021: EF 65%  - chest xray: pending   - ecg: nsr, no st changes   - HS trop 13  - BNP 263  - continue pta losartan   - continue pta metoprolol  - continue pta lasix     CAD  - status post CABG in 1999-LIMA to LAD, SVG to OM branch, SVG to PDA  - Status post LHC in June 2020-atretic LIMA to diagonal graft, 100% occluded SVG to PDA, patent SVG to OM branch  - ECG: nsr, no st changes, denied chest pain   - chest xray:  - HS trop  - BNP     HTN  - continue pta losartan   - continue pta metoprolol    HLD  - continue pta statin     Morbid obesity   - BMI 47    ______________________________________________________________________________    Primary Care Physician: Wilford Grist     Chief Complaint: direct admit from cardiology clinic with increased oxygen needs and dyspnea.     History of Present Illness: Amy Bowman is a 81 y.o. female with PMH of as below presented with direct admit from cardiology clinic with increased oxygen needs and dyspnea. She  usually at home does use 2 L/min and she had to increase the oxygen to 6 L/min. Patient denies chest pain, reports dry cough, denied fever, vomiting, fever, reports 6 pound weight loss and LE swelling.     Medical History:   Diagnosis Date   ? Abdominal wall hernia 06/02/2009   ? Arthritis    ? Asthma    ? Back pain    ? CAD (coronary artery disease)    ? Diabetes (HCC)    ? Emphysema 06/02/2009   ? HLD (hyperlipidemia)    ? HTN (hypertension)    ? Hyperlipemia    ? Memory difficulties 01/30/2019    has a hard time remembering new information or changes in medication/health care needs   ? Near-syncope    ? Obesity 06/02/2009   ? Personal history of CABG (coronary artery bypass graft) 06/02/2009   ? Type II diabetes mellitus (HCC)    ? Vision decreased      Surgical History:   Procedure Laterality Date   ? HIP REPLACEMENT Bilateral 2000   ? CYST REMOVAL  2011   ? THORACIC LAMINECTOMY  08/2011    removal of spinal cord tumor   ? TUMOR REMOVAL  2013    SPINE    ? ANGIOGRAPHY CORONARY ARTERY  WITH LEFT HEART CATHETERIZATION N/A 01/29/2019    Performed by Myriam Jacobson, MD at [facility] CATH LAB   ? POSSIBLE PERCUTANEOUS CORONARY STENT PLACEMENT WITH ANGIOPLASTY N/A 01/29/2019    Performed by Myriam Jacobson, MD at [facility] CATH LAB   ? CESAREAN SECTION      x2   ? DOPPLER ECHOCARDIOGRAPHY     ? ELECTROCARDIOGRAM     ? HX APPENDECTOMY     ? HX CORONARY ARTERY BYPASS GRAFT     ? LEFT HEART CATHETERIZATION     ? MYOCARDIAL PERFUSION IMG STUDY     ? SHOULDER SURGERY Right     2011     Family History   Problem Relation Age of Onset   ? Cancer Mother    ? Cancer-Lung Mother    ? Arthritis-osteo Mother    ? Cancer Father    ? Cancer-Lung Father    ? High Cholesterol Father    ? Cancer-Lung Sister    ? Arthritis-rheumatoid Sister    ? Stroke Brother      Social History     Socioeconomic History   ? Marital status: Married   Tobacco Use   ? Smoking status: Former     Packs/day: 3.00     Years: 40.00     Additional pack years: 0.00     Total pack years: 120.00     Types: Cigarettes     Quit date: 07/30/1998     Years since quitting: 23.8   ? Smokeless tobacco: Former     Types: Chew     Quit date: 07/02/1997   ? Tobacco comments:     Quit 1999   Vaping Use   ? Vaping Use: Never used   Substance and Sexual Activity   ? Alcohol use: Yes     Comment: rarely   ? Drug use: No      Immunizations (includes history and patient reported): Immunization History   Administered Date(s) Administered   ? COVID-19 [facility]), mRNA vacc, 100 mcg/0.5 mL (PF) 10/23/2019, 11/20/2019, 06/21/2020   ? COVID-19 Bivalent (54YR+)(PFIZER), mRNA vacc, 20mcg/0.3mL 05/19/2021           Allergies:  Celebrex [celecoxib] and Vioxx [rofecoxib]    Medications:  Medications Prior to Admission   Medication Sig   ? acetaminophen (TYLENOL) 500 mg tablet Take two tablets by mouth twice daily as needed. Max of 4,000 mg of acetaminophen in 24 hours.   ? albuterol (VENTOLIN HFA, PROAIR HFA) 90 mcg/Actuation IN inhaler Inhale two puffs by mouth into the lungs at bedtime daily.   ? aspirin EC 81 mg PO tablet Take one tablet by mouth daily.   ? atorvastatin (LIPITOR) 40 mg tablet TAKE ONE TABLET BY MOUTH DAILY. INDICATIONS: HIGH CHOLESTEROL AND HIGH TRIGLYCERIDES   ? BIOTIN PO Take 1 tablet by mouth daily.   ? fluticasone (FLONASE) 50 mcg/actuation nasal spray Apply one spray to each nostril as directed daily.   ? furosemide (LASIX) 20 mg tablet Take two tablets by mouth daily.   ? gabapentin (NEURONTIN) 100 mg capsule Take one capsule by mouth twice daily.   ? HYDROcodone/acetaminophen (NORCO) 7.5/325 mg tablet    ? insulin lispro(+) 75/25 (HUMALOG MIX 75/25) 100 unit/mL (75/25) SC Susp Inject  under the skin as Needed. Sliding scale   ? KLOR-CON M20 20 mEq tablet TAKE 1 TABLET BY MOUTH EVERY DAY WITH MEAL AND FULL GLASS OF WATER   ? losartan (COZAAR) 100 mg tablet TAKE 1 TABLET  BY MOUTH EVERY DAY   ? metFORMIN (GLUCOPHAGE) 1,000 mg tablet Take one tablet by mouth twice daily with meals. Resume on February 02, 2019 (Patient taking differently: Take one tablet by mouth daily. Resume on February 02, 2019)   ? metoprolol succinate XL (TOPROL XL) 25 mg extended release tablet TAKE 1 TABLET BY MOUTH EVERY DAY   ? naproxen (NAPROSYN) 500 mg PO tablet Take one tablet by mouth daily. one and one half   ? polyethylene glycol 3350 (MIRALAX) 17 g packet Take one packet by mouth as Needed.   ? zafirlukast(+) (ACCOLATE) 20 mg PO tablet Take one tablet by mouth twice daily.     Review of Systems:  A comprehensive  12 point review of organ systems reviewed and was negative except for the ones mentioned in HOPI    Physical Exam:  Vital Signs: Last Filed In 24 Hours Vital Signs: 24 Hour Range   BP: 152/51 (10/05 1408)  Temp: 36 ?C (96.8 ?F) (10/05 1408)  Pulse: 79 (10/05 1408)  Respirations: 19 PER MINUTE (10/05 1408)  SpO2: 100 % (10/05 1408)  O2 Device: Nasal cannula (10/05 1408)  O2 Liter Flow: 5 Lpm (10/05 1408)  Height: 152.4 cm (5') (10/05 1408) BP: (152-181)/(51-89)   Temp:  [36 ?C (96.8 ?F)]   Pulse:  [78-79]   Respirations:  [19 PER MINUTE]   SpO2:  [96 %-100 %]   O2 Device: Nasal cannula  O2 Liter Flow: 5 Lpm          General:  Alert, awake, oriented x 3 , cooperative, no distress, appears stated age  Head:  Normocephalic, without obvious abnormality, atraumatic  Eyes:  Conjunctivae/corneas clear   Nose: Nares normal. Mucosa normal.  No drainage or sinus tenderness  Throat: Lips, mucosa and tongue normal  Neck:    Supple, symmetrical, trachea midline, no adenopathy, thyroid: no enlargement/tenderness/nodules  Lungs:  Clear to auscultation bilaterally  Heart:   Regular rate and rhythm, S1, S2 normal, no murmur  Abdomen:  Soft, non-tender.  Bowel sounds normal.  No masses.  No organomegaly. No guarding, no rebound   Extremities: Extremities normal, atraumatic, no cyanosis or edema  Skin: Skin color, texture, turgor normal.    Lymph nodes:  Cervical, supraclavicular and axillary nodes normal  Neurologic: Non focal grossly    Lab/Radiology/Other Diagnostic Tests:  24-hour labs:    Results for orders placed or performed during the hospital encounter of 05/25/22 (from the past 24 hour(s))   POC GLUCOSE    Collection Time: 05/25/22  2:06 PM   Result Value Ref Range    Glucose, POC 96 70 - 100 MG/DL     POC Glucose (Download): 96 (05/25/22 1406)  Pertinent radiology reviewed.    No results found.

## 2022-05-26 ENCOUNTER — Inpatient Hospital Stay: Admit: 2022-05-26 | Discharge: 2022-05-26 | Payer: MEDICARE

## 2022-05-26 ENCOUNTER — Encounter: Admit: 2022-05-26 | Discharge: 2022-05-26 | Payer: MEDICARE

## 2022-05-26 MED ADMIN — SODIUM CHLORIDE 0.65 % NA SPRA [29676]: 2 | NASAL | @ 18:00:00 | NDC 00904386575

## 2022-05-26 MED ADMIN — BARIUM SULFATE 98 % PO SUSR [136835]: 60 mL | ORAL | @ 21:00:00 | Stop: 2022-05-26 | NDC 32909076401

## 2022-05-26 MED ADMIN — PERFLUTREN LIPID MICROSPHERES 1.1 MG/ML IV SUSP [79178]: 2 mL | INTRAVENOUS | @ 21:00:00 | Stop: 2022-05-26 | NDC 11994001116

## 2022-05-26 MED ADMIN — INSULIN ASPART 100 UNIT/ML SC FLEXPEN [87504]: 1 [IU] | SUBCUTANEOUS | @ 22:00:00 | NDC 00169633910

## 2022-05-26 MED ADMIN — FUROSEMIDE 10 MG/ML IJ SOLN [3291]: 40 mg | INTRAVENOUS | @ 21:00:00 | Stop: 2022-05-26 | NDC 55150032301

## 2022-05-26 MED ADMIN — BARIUM SULFATE 700 MG PO TAB [301158]: 700 mg | ORAL | @ 21:00:00 | Stop: 2022-05-26 | NDC 10361077831

## 2022-05-26 MED ADMIN — BARIUM SULFATE 40 % (W/V) PO SUSP [95015]: 20 mL | ORAL | @ 21:00:00 | Stop: 2022-05-26 | NDC 32909011500

## 2022-05-26 MED ADMIN — HYDROXYZINE HCL 25 MG PO TAB [3774]: 25 mg | ORAL | @ 17:00:00 | NDC 00904661761

## 2022-05-27 MED ADMIN — SODIUM CHLORIDE 7 % IN NEBU [163054]: 4 mL | RESPIRATORY_TRACT | @ 23:00:00 | Stop: 2022-05-27 | NDC 44229030760

## 2022-05-27 MED ADMIN — ENOXAPARIN 40 MG/0.4 ML SC SYRG [85052]: 40 mg | SUBCUTANEOUS | @ 01:00:00 | NDC 00781324602

## 2022-05-27 MED ADMIN — HYDROXYZINE HCL 25 MG PO TAB [3774]: 25 mg | ORAL | @ 15:00:00 | NDC 00904661761

## 2022-05-27 MED ADMIN — AMOXICILLIN-POT CLAVULANATE 875-125 MG PO TAB [33228]: 875 mg | ORAL | @ 15:00:00 | NDC 00093227534

## 2022-05-27 MED ADMIN — IOHEXOL 350 MG IODINE/ML IV SOLN [81210]: 80 mL | INTRAVENOUS | @ 04:00:00 | Stop: 2022-05-27 | NDC 00407141491

## 2022-05-27 MED ADMIN — CALCIUM CARBONATE 200 MG CALCIUM (500 MG) PO CHEW [9385]: 1000 mg | ORAL | @ 13:00:00 | NDC 66553000401

## 2022-05-27 MED ADMIN — DICLOFENAC SODIUM 1 % TP GEL [168032]: 2 g | TOPICAL | @ 22:00:00 | NDC 00067815203

## 2022-05-27 MED ADMIN — ENOXAPARIN 40 MG/0.4 ML SC SYRG [85052]: 40 mg | SUBCUTANEOUS | @ 13:00:00 | NDC 00781324602

## 2022-05-27 MED ADMIN — AMOXICILLIN-POT CLAVULANATE 875-125 MG PO TAB [33228]: 875 mg | ORAL | @ 22:00:00 | NDC 00093227534

## 2022-05-27 MED ADMIN — SODIUM CHLORIDE 0.9 % IJ SOLN [7319]: 50 mL | INTRAVENOUS | @ 04:00:00 | Stop: 2022-05-27 | NDC 00409488820

## 2022-05-27 MED ADMIN — FUROSEMIDE 10 MG/ML IJ SOLN [3291]: 40 mg | INTRAVENOUS | @ 17:00:00 | Stop: 2022-05-27 | NDC 55150032301

## 2022-05-27 MED ADMIN — DOXYCYCLINE HYCLATE 100 MG PO TAB [2625]: 100 mg | ORAL | @ 15:00:00 | NDC 00904043004

## 2022-05-28 MED ADMIN — HYDROXYZINE HCL 25 MG PO TAB [3774]: 25 mg | ORAL | @ 14:00:00 | NDC 00904661761

## 2022-05-28 MED ADMIN — POLYETHYLENE GLYCOL 3350 17 GRAM PO PWPK [25424]: 17 g | ORAL | @ 14:00:00 | NDC 00904693186

## 2022-05-28 MED ADMIN — AMOXICILLIN-POT CLAVULANATE 875-125 MG PO TAB [33228]: 875 mg | ORAL | @ 14:00:00 | NDC 00093227534

## 2022-05-28 MED ADMIN — AMOXICILLIN-POT CLAVULANATE 875-125 MG PO TAB [33228]: 875 mg | ORAL | @ 22:00:00 | NDC 00093227534

## 2022-05-28 MED ADMIN — PANTOPRAZOLE 40 MG PO TBEC [80436]: 40 mg | ORAL | @ 03:00:00 | NDC 00904647461

## 2022-05-28 MED ADMIN — DOXYCYCLINE HYCLATE 100 MG PO TAB [2625]: 100 mg | ORAL | @ 03:00:00 | NDC 00904043004

## 2022-05-28 MED ADMIN — IPRATROPIUM-ALBUTEROL 0.5 MG-3 MG(2.5 MG BASE)/3 ML IN NEBU [77459]: 3 mL | RESPIRATORY_TRACT | @ 19:00:00 | NDC 00378967131

## 2022-05-28 MED ADMIN — POLYETHYLENE GLYCOL 3350 17 GRAM PO PWPK [25424]: 17 g | ORAL | @ 02:00:00 | NDC 00904693186

## 2022-05-28 MED ADMIN — ENOXAPARIN 40 MG/0.4 ML SC SYRG [85052]: 40 mg | SUBCUTANEOUS | @ 14:00:00 | NDC 00781324602

## 2022-05-28 MED ADMIN — DOXYCYCLINE HYCLATE 100 MG PO TAB [2625]: 100 mg | ORAL | @ 14:00:00 | NDC 00904043004

## 2022-05-28 MED ADMIN — HYDROXYZINE HCL 25 MG PO TAB [3774]: 25 mg | ORAL | NDC 00904661761

## 2022-05-28 MED ADMIN — ENOXAPARIN 40 MG/0.4 ML SC SYRG [85052]: 40 mg | SUBCUTANEOUS | @ 03:00:00 | NDC 00781324602

## 2022-05-29 ENCOUNTER — Encounter: Admit: 2022-05-29 | Discharge: 2022-05-29 | Payer: MEDICARE

## 2022-05-29 MED ADMIN — AMOXICILLIN-POT CLAVULANATE 875-125 MG PO TAB [33228]: 875 mg | ORAL | @ 13:00:00 | NDC 00093227534

## 2022-05-29 MED ADMIN — SENNOSIDES-DOCUSATE SODIUM 8.6-50 MG PO TAB [40926]: 2 | ORAL | @ 14:00:00 | NDC 00536124810

## 2022-05-29 MED ADMIN — GABAPENTIN 100 MG PO CAP [18309]: 200 mg | ORAL | @ 21:00:00 | NDC 00904666561

## 2022-05-29 MED ADMIN — ACETAMINOPHEN 500 MG PO TAB [102]: 1000 mg | ORAL | @ 14:00:00 | NDC 00904673061

## 2022-05-29 MED ADMIN — DOXYCYCLINE HYCLATE 100 MG PO TAB [2625]: 100 mg | ORAL | @ 02:00:00 | NDC 00904043004

## 2022-05-29 MED ADMIN — FLUTICASONE FUROATE 100 MCG/ACTUATION IN DSDV [323653]: 1 | RESPIRATORY_TRACT | @ 20:00:00 | NDC 00173087414

## 2022-05-29 MED ADMIN — HYDROXYZINE HCL 25 MG PO TAB [3774]: 25 mg | ORAL | @ 14:00:00 | NDC 00904661761

## 2022-05-29 MED ADMIN — GABAPENTIN 100 MG PO CAP [18309]: 100 mg | ORAL | @ 14:00:00 | Stop: 2022-05-29 | NDC 00904666561

## 2022-05-29 MED ADMIN — UMECLIDINIUM-VILANTEROL 62.5-25 MCG/ACTUATION IN DSDV [320367]: 1 | RESPIRATORY_TRACT | @ 20:00:00 | NDC 00173086906

## 2022-05-29 MED ADMIN — POLYETHYLENE GLYCOL 3350 17 GRAM PO PWPK [25424]: 17 g | ORAL | @ 14:00:00 | Stop: 2022-05-29 | NDC 00904693186

## 2022-05-29 MED ADMIN — PANTOPRAZOLE 40 MG PO TBEC [80436]: 40 mg | ORAL | @ 02:00:00 | NDC 00904647461

## 2022-05-29 MED ADMIN — IPRATROPIUM-ALBUTEROL 0.5 MG-3 MG(2.5 MG BASE)/3 ML IN NEBU [77459]: 3 mL | RESPIRATORY_TRACT | @ 02:00:00 | NDC 00378967131

## 2022-05-29 MED ADMIN — FUROSEMIDE 10 MG/ML IJ SOLN [3291]: 40 mg | INTRAVENOUS | @ 13:00:00 | Stop: 2022-05-29 | NDC 55150032301

## 2022-05-29 MED ADMIN — DOXYCYCLINE HYCLATE 100 MG PO TAB [2625]: 100 mg | ORAL | @ 13:00:00 | NDC 00904043004

## 2022-05-29 MED ADMIN — AMOXICILLIN-POT CLAVULANATE 875-125 MG PO TAB [33228]: 875 mg | ORAL | @ 22:00:00 | NDC 00093227534

## 2022-05-29 MED ADMIN — FUROSEMIDE 10 MG/ML IJ SOLN [3291]: 40 mg | INTRAVENOUS | @ 22:00:00 | NDC 55150032301

## 2022-05-30 ENCOUNTER — Encounter: Admit: 2022-05-30 | Discharge: 2022-05-30 | Payer: MEDICARE

## 2022-05-30 ENCOUNTER — Inpatient Hospital Stay: Admit: 2022-05-30 | Discharge: 2022-05-30 | Payer: MEDICARE

## 2022-05-30 MED ORDER — LIDOCAINE (PF) 20 MG/ML (2 %) IJ SOLN
INTRAVENOUS | 0 refills | Status: DC
Start: 2022-05-30 — End: 2022-05-30

## 2022-05-30 MED ORDER — LACTATED RINGERS IV SOLP
INTRAVENOUS | 0 refills | Status: DC
Start: 2022-05-30 — End: 2022-05-30

## 2022-05-30 MED ORDER — PROPOFOL INJ 10 MG/ML IV VIAL
INTRAVENOUS | 0 refills | Status: DC
Start: 2022-05-30 — End: 2022-05-30

## 2022-05-30 MED ADMIN — CALCIUM CARBONATE 200 MG CALCIUM (500 MG) PO CHEW [9385]: 1000 mg | ORAL | @ 18:00:00 | NDC 66553000401

## 2022-05-30 MED ADMIN — ALBUTEROL SULFATE 2.5 MG /3 ML (0.083 %) IN NEBU [250]: 2.5 mg | RESPIRATORY_TRACT | @ 02:00:00 | NDC 60687039579

## 2022-05-30 MED ADMIN — AMOXICILLIN-POT CLAVULANATE 875-125 MG PO TAB [33228]: 875 mg | ORAL | @ 18:00:00 | Stop: 2022-06-01 | NDC 00093227534

## 2022-05-30 MED ADMIN — GABAPENTIN 100 MG PO CAP [18309]: 200 mg | ORAL | @ 02:00:00 | NDC 00904666561

## 2022-05-30 MED ADMIN — FUROSEMIDE 10 MG/ML IJ SOLN [3291]: 40 mg | INTRAVENOUS | @ 18:00:00 | Stop: 2022-05-30 | NDC 55150032301

## 2022-05-30 MED ADMIN — GABAPENTIN 100 MG PO CAP [18309]: 200 mg | ORAL | @ 18:00:00 | NDC 00904666561

## 2022-05-30 MED ADMIN — OXYCODONE 5 MG PO TAB [10814]: 5 mg | ORAL | @ 09:00:00 | Stop: 2022-05-30 | NDC 00406055223

## 2022-05-30 MED ADMIN — DOXYCYCLINE HYCLATE 100 MG PO TAB [2625]: 100 mg | ORAL | @ 18:00:00 | Stop: 2022-06-01 | NDC 00904043004

## 2022-05-30 MED ADMIN — ACETAMINOPHEN 500 MG PO TAB [102]: 1000 mg | ORAL | @ 18:00:00 | NDC 00904673061

## 2022-05-30 MED ADMIN — PANTOPRAZOLE 40 MG PO TBEC [80436]: 40 mg | ORAL | @ 02:00:00 | NDC 00904647461

## 2022-05-30 MED ADMIN — ACETAMINOPHEN 500 MG PO TAB [102]: 1000 mg | ORAL | @ 10:00:00 | NDC 00904673061

## 2022-05-30 MED ADMIN — MELATONIN 5 MG PO TAB [168576]: 5 mg | ORAL | @ 02:00:00 | NDC 77333052025

## 2022-05-30 MED ADMIN — ACETAMINOPHEN 500 MG PO TAB [102]: 1000 mg | ORAL | @ 02:00:00 | NDC 00904673061

## 2022-05-30 MED ADMIN — AMOXICILLIN-POT CLAVULANATE 875-125 MG PO TAB [33228]: 875 mg | ORAL | Stop: 2022-06-01 | NDC 00093227534

## 2022-05-30 MED ADMIN — OXYCODONE 5 MG PO TAB [10814]: 5 mg | ORAL | @ 02:00:00 | NDC 00406055223

## 2022-05-30 MED ADMIN — OXYCODONE 5 MG PO TAB [10814]: 2.5 mg | ORAL | @ 18:00:00 | NDC 00406055223

## 2022-05-30 MED ADMIN — DOXYCYCLINE HYCLATE 100 MG PO TAB [2625]: 100 mg | ORAL | @ 02:00:00 | NDC 00904043004

## 2022-05-30 NOTE — Anesthesia Post-Procedure Evaluation
Post-Anesthesia Evaluation    Name: Amy Bowman      MRN: [id number]     DOB: 1940-10-19     Age: 81 y.o.     Sex: female   __________________________________________________________________________     Procedure Information     Anesthesia Start Date/Time: 05/30/22 1039    Procedures:       ESOPHAGOGASTRODUODENOSCOPY WITH BIOPSY - FLEXIBLE      ESOPHAGOGASTRODUODENOSCOPY WITH TRANSENDOSCOPIC BALLOON DILATION - LESS THAN 30 MM DIAMETER    Location: ENDO 3 / ENDO/GI    Surgeons: Buckles, Darnelle Maffucci, MD          Post-Anesthesia Vitals  BP: 131/94 (10/10 1115)  Temp: 36.1 C (97 F) (10/10 1105)  Pulse: 64 (10/10 1115)  Respirations: 18 PER MINUTE (10/10 1115)  SpO2: 100 % (10/10 1115)  SpO2 Pulse: 65 (10/10 1115)  O2 Device: Nasal cannula (10/10 1115)   Vitals Value Taken Time   BP 131/94 05/30/22 1115   Temp 36.1 C (97 F) 05/30/22 1105   Pulse 64 05/30/22 1115   Respirations 18 PER MINUTE 05/30/22 1115   SpO2 100 % 05/30/22 1115   O2 Device Nasal cannula 05/30/22 1115   ABP     ART BP           Post Anesthesia Evaluation Note    Evaluation location: Pre/Post  Patient participation: recovered; patient participated in evaluation  Level of consciousness: alert    Pain score: 0  Pain management: adequate    Hydration: normovolemia  Temperature: 36.0C - 38.4C  Airway patency: adequate    Perioperative Events       Post-op nausea and vomiting: no PONV    Postoperative Status  Cardiovascular status: hemodynamically stable  Respiratory status: spontaneous ventilation and supplemental oxygen  Follow-up needed: none  Additional comments: Baseline O2        Perioperative Events  There were no known notable events for this encounter.

## 2022-05-31 ENCOUNTER — Encounter: Admit: 2022-05-31 | Discharge: 2022-05-31 | Payer: MEDICARE

## 2022-05-31 DIAGNOSIS — E785 Hyperlipidemia, unspecified: Secondary | ICD-10-CM

## 2022-05-31 DIAGNOSIS — I2510 Atherosclerotic heart disease of native coronary artery without angina pectoris: Secondary | ICD-10-CM

## 2022-05-31 DIAGNOSIS — I10 Essential (primary) hypertension: Secondary | ICD-10-CM

## 2022-05-31 DIAGNOSIS — J45909 Unspecified asthma, uncomplicated: Secondary | ICD-10-CM

## 2022-05-31 DIAGNOSIS — R413 Other amnesia: Secondary | ICD-10-CM

## 2022-05-31 DIAGNOSIS — J439 Emphysema, unspecified: Secondary | ICD-10-CM

## 2022-05-31 DIAGNOSIS — E669 Obesity, unspecified: Secondary | ICD-10-CM

## 2022-05-31 DIAGNOSIS — E119 Type 2 diabetes mellitus without complications: Secondary | ICD-10-CM

## 2022-05-31 DIAGNOSIS — K439 Ventral hernia without obstruction or gangrene: Secondary | ICD-10-CM

## 2022-05-31 DIAGNOSIS — H547 Unspecified visual loss: Secondary | ICD-10-CM

## 2022-05-31 DIAGNOSIS — M1990 Unspecified osteoarthritis, unspecified site: Secondary | ICD-10-CM

## 2022-05-31 DIAGNOSIS — M549 Dorsalgia, unspecified: Secondary | ICD-10-CM

## 2022-05-31 MED ADMIN — ENOXAPARIN 40 MG/0.4 ML SC SYRG [85052]: 40 mg | SUBCUTANEOUS | @ 03:00:00 | NDC 63323056421

## 2022-05-31 MED ADMIN — ACETAMINOPHEN 500 MG PO TAB [102]: 1000 mg | ORAL | @ 03:00:00 | NDC 00904673061

## 2022-05-31 MED ADMIN — ACETAMINOPHEN 500 MG PO TAB [102]: 1000 mg | ORAL | @ 11:00:00 | NDC 00904673061

## 2022-05-31 MED ADMIN — PANTOPRAZOLE 40 MG PO TBEC [80436]: 40 mg | ORAL | @ 03:00:00 | NDC 00904647461

## 2022-05-31 MED ADMIN — ACETAMINOPHEN 500 MG PO TAB [102]: 1000 mg | ORAL | @ 22:00:00 | NDC 00904673061

## 2022-05-31 MED ADMIN — SODIUM CHLORIDE 0.9 % IV SOLP [27838]: 500 mL | INTRAVENOUS | @ 10:00:00 | Stop: 2022-05-31 | NDC 00338004903

## 2022-05-31 MED ADMIN — GABAPENTIN 100 MG PO CAP [18309]: 200 mg | ORAL | @ 22:00:00 | NDC 00904666561

## 2022-05-31 MED ADMIN — OXYCODONE 5 MG PO TAB [10814]: 2.5 mg | ORAL | NDC 00406055223

## 2022-05-31 MED ADMIN — DICLOFENAC SODIUM 1 % TP GEL [168032]: 2 g | TOPICAL | @ 15:00:00 | NDC 00067815203

## 2022-05-31 MED ADMIN — GABAPENTIN 100 MG PO CAP [18309]: 200 mg | ORAL | @ 03:00:00 | NDC 00904666561

## 2022-05-31 MED ADMIN — MELATONIN 5 MG PO TAB [168576]: 5 mg | ORAL | @ 03:00:00 | NDC 77333052025

## 2022-05-31 MED ADMIN — ENOXAPARIN 40 MG/0.4 ML SC SYRG [85052]: 40 mg | SUBCUTANEOUS | @ 15:00:00 | NDC 00781324602

## 2022-05-31 MED ADMIN — OXYCODONE 5 MG PO TAB [10814]: 2.5 mg | ORAL | @ 07:00:00 | NDC 00406055223

## 2022-05-31 MED ADMIN — OXYCODONE 5 MG PO TAB [10814]: 2.5 mg | ORAL | @ 22:00:00 | NDC 68084035411

## 2022-05-31 MED ADMIN — AMOXICILLIN-POT CLAVULANATE 875-125 MG PO TAB [33228]: 875 mg | ORAL | @ 22:00:00 | Stop: 2022-06-01 | NDC 00093227534

## 2022-05-31 MED ADMIN — LIDOCAINE 5 % TP PTMD [80759]: 1 | TOPICAL | @ 15:00:00 | NDC 00591352511

## 2022-05-31 MED ADMIN — TRAMADOL 50 MG PO TAB [14632]: 50 mg | ORAL | @ 02:00:00 | NDC 00904717961

## 2022-05-31 MED ADMIN — DOXYCYCLINE HYCLATE 100 MG PO TAB [2625]: 100 mg | ORAL | @ 03:00:00 | Stop: 2022-06-01 | NDC 00904043004

## 2022-06-01 ENCOUNTER — Encounter: Admit: 2022-06-01 | Discharge: 2022-06-01 | Payer: MEDICARE

## 2022-06-01 MED ADMIN — LIDOCAINE 5 % TP PTMD [80759]: 1 | TOPICAL | @ 14:00:00 | Stop: 2022-06-01 | NDC 00591352511

## 2022-06-01 MED ADMIN — GABAPENTIN 100 MG PO CAP [18309]: 200 mg | ORAL | @ 02:00:00 | NDC 00904666561

## 2022-06-01 MED ADMIN — SENNOSIDES-DOCUSATE SODIUM 8.6-50 MG PO TAB [40926]: 2 | ORAL | @ 02:00:00 | NDC 00536124810

## 2022-06-01 MED ADMIN — MELATONIN 5 MG PO TAB [168576]: 5 mg | ORAL | @ 02:00:00 | NDC 77333052025

## 2022-06-01 MED ADMIN — MAGNESIUM SULFATE IN D5W 1 GRAM/100 ML IV PGBK [166578]: 1 g | INTRAVENOUS | @ 19:00:00 | Stop: 2022-06-01 | NDC 63323010800

## 2022-06-01 MED ADMIN — FUROSEMIDE 40 MG PO TAB [3295]: 40 mg | ORAL | @ 14:00:00 | Stop: 2022-06-01 | NDC 51079007301

## 2022-06-01 MED ADMIN — MAGNESIUM SULFATE IN D5W 1 GRAM/100 ML IV PGBK [166578]: 1 g | INTRAVENOUS | @ 15:00:00 | Stop: 2022-06-01 | NDC 63323010800

## 2022-06-01 MED ADMIN — ACETAMINOPHEN 500 MG PO TAB [102]: 1000 mg | ORAL | @ 11:00:00 | Stop: 2022-06-01 | NDC 00904673061

## 2022-06-01 MED ADMIN — ACETAMINOPHEN 500 MG PO TAB [102]: 1000 mg | ORAL | @ 19:00:00 | Stop: 2022-06-01 | NDC 00904673061

## 2022-06-01 MED ADMIN — ACETAMINOPHEN 500 MG PO TAB [102]: 1000 mg | ORAL | @ 04:00:00 | NDC 00904673061

## 2022-06-01 MED ADMIN — HYDROXYZINE HCL 25 MG PO TAB [3774]: 25 mg | ORAL | @ 02:00:00 | NDC 00904661761

## 2022-06-01 MED ADMIN — HYDROXYZINE HCL 25 MG PO TAB [3774]: 25 mg | ORAL | @ 19:00:00 | Stop: 2022-06-01 | NDC 00904661761

## 2022-06-01 MED ADMIN — ENOXAPARIN 40 MG/0.4 ML SC SYRG [85052]: 40 mg | SUBCUTANEOUS | @ 02:00:00 | NDC 00781324602

## 2022-06-01 MED ADMIN — LIDOCAINE 5 % TP PTMD [80759]: 1 | TOPICAL | @ 02:00:00 | NDC 00591352511

## 2022-06-01 MED ADMIN — OXYCODONE 5 MG PO TAB [10814]: 2.5 mg | ORAL | @ 15:00:00 | Stop: 2022-06-01 | NDC 68084035411

## 2022-06-01 MED ADMIN — GABAPENTIN 100 MG PO CAP [18309]: 200 mg | ORAL | @ 19:00:00 | Stop: 2022-06-01 | NDC 00904666561

## 2022-06-01 MED ADMIN — DOXYCYCLINE HYCLATE 100 MG PO TAB [2625]: 100 mg | ORAL | @ 02:00:00 | Stop: 2022-06-01 | NDC 00904043004

## 2022-06-01 MED ADMIN — PANTOPRAZOLE 40 MG PO TBEC [80436]: 40 mg | ORAL | @ 02:00:00 | NDC 00904647461

## 2022-06-01 MED ADMIN — ENOXAPARIN 40 MG/0.4 ML SC SYRG [85052]: 40 mg | SUBCUTANEOUS | @ 14:00:00 | Stop: 2022-06-01 | NDC 63323056421

## 2022-06-01 MED ADMIN — GABAPENTIN 100 MG PO CAP [18309]: 200 mg | ORAL | @ 14:00:00 | Stop: 2022-06-01 | NDC 00904666561

## 2022-06-19 ENCOUNTER — Encounter: Admit: 2022-06-19 | Discharge: 2022-06-19 | Payer: MEDICARE

## 2022-06-19 MED ORDER — LOSARTAN 50 MG PO TAB
50 mg | ORAL_TABLET | Freq: Every day | ORAL | 0 refills
Start: 2022-06-19 — End: ?

## 2022-07-03 ENCOUNTER — Encounter: Admit: 2022-07-03 | Discharge: 2022-07-03 | Payer: MEDICARE

## 2022-07-06 ENCOUNTER — Encounter: Admit: 2022-07-06 | Discharge: 2022-07-06 | Payer: MEDICARE

## 2022-07-06 MED ORDER — LOSARTAN 50 MG PO TAB
50 mg | ORAL_TABLET | Freq: Every day | ORAL | 0 refills
Start: 2022-07-06 — End: ?

## 2022-07-06 MED ORDER — KLOR-CON M20 20 MEQ PO TBTQ
ORAL_TABLET | 3 refills
Start: 2022-07-06 — End: ?

## 2022-07-22 ENCOUNTER — Encounter: Admit: 2022-07-22 | Discharge: 2022-07-22 | Payer: MEDICARE

## 2022-07-22 MED ORDER — LOSARTAN 50 MG PO TAB
50 mg | ORAL_TABLET | Freq: Every day | ORAL | 0 refills
Start: 2022-07-22 — End: ?

## 2022-07-26 ENCOUNTER — Encounter: Admit: 2022-07-26 | Discharge: 2022-07-26 | Payer: MEDICARE

## 2022-07-26 MED ORDER — TRELEGY ELLIPTA 200-62.5-25 MCG IN DSDV
1 | Freq: Every day | RESPIRATORY_TRACT | 1 refills
Start: 2022-07-26 — End: ?

## 2022-08-07 ENCOUNTER — Encounter: Admit: 2022-08-07 | Discharge: 2022-08-07 | Payer: MEDICARE

## 2022-08-07 MED ORDER — LOSARTAN 50 MG PO TAB
50 mg | ORAL_TABLET | Freq: Every day | ORAL | 0 refills
Start: 2022-08-07 — End: ?

## 2022-08-10 ENCOUNTER — Encounter: Admit: 2022-08-10 | Discharge: 2022-08-10 | Payer: MEDICARE

## 2022-08-10 MED ORDER — GABAPENTIN 100 MG PO CAP
200 mg | ORAL_CAPSULE | Freq: Three times a day (TID) | ORAL | 1 refills
Start: 2022-08-10 — End: ?

## 2022-09-05 ENCOUNTER — Encounter: Admit: 2022-09-05 | Discharge: 2022-09-05 | Payer: MEDICARE

## 2022-09-07 ENCOUNTER — Encounter: Admit: 2022-09-07 | Discharge: 2022-09-07 | Payer: MEDICARE

## 2022-09-07 ENCOUNTER — Ambulatory Visit: Admit: 2022-09-07 | Discharge: 2022-09-07 | Payer: MEDICARE

## 2022-09-07 DIAGNOSIS — R1310 Dysphagia, unspecified: Secondary | ICD-10-CM

## 2022-09-20 ENCOUNTER — Encounter: Admit: 2022-09-20 | Discharge: 2022-09-20 | Payer: MEDICARE

## 2022-09-25 ENCOUNTER — Encounter: Admit: 2022-09-25 | Discharge: 2022-09-25 | Payer: MEDICARE

## 2022-10-05 ENCOUNTER — Encounter: Admit: 2022-10-05 | Discharge: 2022-10-05 | Payer: MEDICARE

## 2022-10-12 ENCOUNTER — Encounter: Admit: 2022-10-12 | Discharge: 2022-10-12 | Payer: MEDICARE

## 2022-10-12 MED ORDER — METOPROLOL SUCCINATE 25 MG PO TB24
ORAL_TABLET | ORAL | 3 refills | 90.00000 days | Status: AC
Start: 2022-10-12 — End: ?

## 2022-11-10 ENCOUNTER — Encounter: Admit: 2022-11-10 | Discharge: 2022-11-10 | Payer: MEDICARE

## 2022-11-23 ENCOUNTER — Ambulatory Visit: Admit: 2022-11-23 | Discharge: 2022-11-23 | Payer: MEDICARE

## 2022-11-23 ENCOUNTER — Encounter: Admit: 2022-11-23 | Discharge: 2022-11-23 | Payer: MEDICARE

## 2022-12-11 ENCOUNTER — Encounter: Admit: 2022-12-11 | Discharge: 2022-12-11 | Payer: MEDICARE

## 2022-12-26 ENCOUNTER — Encounter: Admit: 2022-12-26 | Discharge: 2022-12-26 | Payer: MEDICARE

## 2022-12-26 MED ORDER — TRELEGY ELLIPTA 200-62.5-25 MCG IN DSDV
1 | Freq: Every day | RESPIRATORY_TRACT | 1 refills
Start: 2022-12-26 — End: ?

## 2023-01-09 ENCOUNTER — Encounter: Admit: 2023-01-09 | Discharge: 2023-01-09 | Payer: MEDICARE

## 2023-01-10 ENCOUNTER — Encounter: Admit: 2023-01-10 | Discharge: 2023-01-10 | Payer: MEDICARE

## 2023-01-11 ENCOUNTER — Encounter: Admit: 2023-01-11 | Discharge: 2023-01-11 | Payer: MEDICARE

## 2023-01-11 DIAGNOSIS — K439 Ventral hernia without obstruction or gangrene: Secondary | ICD-10-CM

## 2023-01-11 DIAGNOSIS — J45909 Unspecified asthma, uncomplicated: Secondary | ICD-10-CM

## 2023-01-11 DIAGNOSIS — E669 Obesity, unspecified: Secondary | ICD-10-CM

## 2023-01-11 DIAGNOSIS — Z9981 Dependence on supplemental oxygen: Secondary | ICD-10-CM

## 2023-01-11 DIAGNOSIS — M159 Polyosteoarthritis, unspecified: Secondary | ICD-10-CM

## 2023-01-11 DIAGNOSIS — E119 Type 2 diabetes mellitus without complications: Secondary | ICD-10-CM

## 2023-01-11 DIAGNOSIS — E1159 Type 2 diabetes mellitus with other circulatory complications: Secondary | ICD-10-CM

## 2023-01-11 DIAGNOSIS — R55 Syncope and collapse: Secondary | ICD-10-CM

## 2023-01-11 DIAGNOSIS — E782 Mixed hyperlipidemia: Secondary | ICD-10-CM

## 2023-01-11 DIAGNOSIS — I10 Essential (primary) hypertension: Secondary | ICD-10-CM

## 2023-01-11 DIAGNOSIS — R9439 Abnormal result of other cardiovascular function study: Secondary | ICD-10-CM

## 2023-01-11 DIAGNOSIS — Z9889 Other specified postprocedural states: Secondary | ICD-10-CM

## 2023-01-11 DIAGNOSIS — I25119 Atherosclerotic heart disease of native coronary artery with unspecified angina pectoris: Secondary | ICD-10-CM

## 2023-01-11 DIAGNOSIS — H547 Unspecified visual loss: Secondary | ICD-10-CM

## 2023-01-11 DIAGNOSIS — J439 Emphysema, unspecified: Secondary | ICD-10-CM

## 2023-01-11 DIAGNOSIS — Z09 Encounter for follow-up examination after completed treatment for conditions other than malignant neoplasm: Secondary | ICD-10-CM

## 2023-01-11 DIAGNOSIS — I2510 Atherosclerotic heart disease of native coronary artery without angina pectoris: Secondary | ICD-10-CM

## 2023-01-11 DIAGNOSIS — M1990 Unspecified osteoarthritis, unspecified site: Secondary | ICD-10-CM

## 2023-01-11 DIAGNOSIS — I214 Non-ST elevation (NSTEMI) myocardial infarction: Secondary | ICD-10-CM

## 2023-01-11 DIAGNOSIS — Z955 Presence of coronary angioplasty implant and graft: Secondary | ICD-10-CM

## 2023-01-11 DIAGNOSIS — M549 Dorsalgia, unspecified: Secondary | ICD-10-CM

## 2023-01-11 DIAGNOSIS — Z993 Dependence on wheelchair: Secondary | ICD-10-CM

## 2023-01-11 DIAGNOSIS — E785 Hyperlipidemia, unspecified: Secondary | ICD-10-CM

## 2023-01-11 DIAGNOSIS — K56609 Unspecified intestinal obstruction, unspecified as to partial versus complete obstruction: Secondary | ICD-10-CM

## 2023-01-11 DIAGNOSIS — Z79899 Other long term (current) drug therapy: Secondary | ICD-10-CM

## 2023-01-11 DIAGNOSIS — R413 Other amnesia: Secondary | ICD-10-CM

## 2023-01-11 DIAGNOSIS — Z951 Presence of aortocoronary bypass graft: Secondary | ICD-10-CM

## 2023-01-11 DIAGNOSIS — D497 Neoplasm of unspecified behavior of endocrine glands and other parts of nervous system: Secondary | ICD-10-CM

## 2023-01-11 MED ORDER — LOSARTAN 25 MG PO TAB
25 mg | ORAL_TABLET | Freq: Every day | ORAL | 3 refills | 90.00000 days | Status: AC
Start: 2023-01-11 — End: ?

## 2023-01-11 NOTE — Progress Notes
Date of Service: 01/11/2023    Amy Bowman is a 82 y.o. female.       HPI      Amy Bowman is a 82 y.o.  white female  with CAD, status post CABG 1999 (LIMA to LAD, SVG to OM branch, SVG to PDA), abnormal stress test in May 2020), status post LHC in June 2020, status post PCI to native left circumflex artery, at that time LIMA was found to be atretic, 100% occluded SVG to left PDA, patent SVG to OM branch) chronic hypoxemic respiratory failure oxygen dependent at 2-3 L/min, stage III, severe COPD, diabetes mellitus type 2 requiring insulin, hypertension, hyperlipidemia, decreased mobility, patient does use a motorized wheelchair, status post several orthopedic interventions, status post left hip previous surgical intervention with unremitting pain, history of incarcerated ventral hernia, small bowel obstruction, status post emergent surgical intervention in April 2024 at [facility], reported non-ST MI during that hospitalization, history of incarcerated.    Patient presented today for his office visit, she is accompanied by her husband.  She states that she is not doing too well, the main reason being unremitting left hip pain.  She is oxygen dependent and currently uses 2-3 L of oxygen per minute 24/7.  She does have a known history of HFpEF (multifactorial), she is on a diuretic regimen, however she is confused whether she takes furosemide or torsemide.    It also has been noticed that she has been relatively hypotensive, currently patient is on losartan 50 mg p.o. daily.    On 09/22/2022 she did not undergo formal pulmonary evaluation with Dr. Glenice Bow, at [facility], it was recommended that she uses inhalers, she will undergo another CT evaluation later this year.  At that time patient is on 6 L of oxygen, the oxygen need has been currently reduced.       Vitals:    01/11/23 1016   BP: (P) 100/58   BP Source: (P) Arm, Left Upper   Pulse: (P) 88   SpO2: (P) 96%   O2 Device: None (Room air) PainSc: Zero   Weight: (P) 95.3 kg (210 lb)  Comment: pt stated weight   Height: 152.4 cm (5')     Body mass index is 41.01 kg/m? (pended).     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 [facility]) 05/26/2022    Acute respiratory failure with hypoxia (HCC) 05/25/2022    COVID-19 vaccine administered 03/09/2020    Preop cardiovascular exam 03/09/2020    Able to mobilize using indoor motorized wheelchair 03/09/2020    On home oxygen therapy 03/09/2020    Presence of drug coated stent in left circumflex coronary artery 03/13/2019    Able to mobilize using indoor motorized wheelchair 03/13/2019    Hospital discharge follow-up 03/13/2019    Coronary artery disease due to lipid rich plaque 01/29/2019    Abnormal thallium stress test 01/16/2019    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/16/2015    Preop cardiovascular exam 04/22/2015    Preoperative cardiovascular examination 11/26/2014    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  Atretic left internal mammary artery to the diagonal graft, 100% occluded vein graft to the left posterior descending artery.  Patent vein graft to obtuse marginal 1.  Successful percutaneous coronary intervention of the native mid to distal left circumflex with Xience 3.0 x 28 mm and Xience Sierra 2.5 x 38 mm drug-eluting stent placed in an overlapping fashion, postdilated with 3.0 mm noncompliant balloon.  High normal left ventricular end-diastolic pressure.      Type II diabetes mellitus (HCC)     HLD (hyperlipidemia)     HTN (hypertension)     Near syncope      10/16/2013-[facility] Carotid Artery US-Impression:  Some minimal atherosclerotic plaque evident bilaterally in the carotid bulbs and origin of the internal carotid arteries that does not appear to be hemodynamically significant at this time with findings consistent with less than a 50% diameter stenosis left and right.           Review of Systems   Constitutional: Negative.   HENT: Negative.     Eyes: Negative.    Cardiovascular: Negative.    Respiratory: Negative.     Endocrine: Negative.    Hematologic/Lymphatic: Negative.    Skin: Negative.    Musculoskeletal: Negative.    Gastrointestinal: Negative.    Genitourinary: Negative.    Neurological: Negative.    Psychiatric/Behavioral: Negative.     Allergic/Immunologic: Negative.        Physical Exam  General Appearance: Morbidly obese, BMI 41.01 kg/m?, patient uses a motorized wheelchair, patient is on oxygen therapy  Skin: warm, moist, no ulcers or xanthomas  Eyes: conjunctivae and lids normal, pupils are equal and round  Lips & Oral Mucosa: no pallor or cyanosis  Neck Veins: neck veins are flat, neck veins are not distended  Chest Inspection: chest is normal in appearance  Respiratory Effort: breathing comfortably, no respiratory distress  Auscultation/Percussion: lungs clear to auscultation, no rales or rhonchi, no wheezing  Cardiac Rhythm: regular rhythm and normal rate  Cardiac Auscultation: S1, S2 normal, no rub, no gallop  Murmurs: 2/6 systolic murmur at the left sternal border  Carotid Arteries: normal carotid upstroke bilaterally, no bruit  Lower Extremity Edema: no lower extremity edema  Abdominal Exam: soft, non-tender, no masses, bowel sounds normal  Liver & Spleen: no organomegaly  Language and Memory: patient responsive and seems to comprehend information  Neurologic Exam: neurological assessment grossly intact    Cardiovascular Studies      Cardiovascular Health Factors  Vitals BP Readings from Last 3 Encounters:   01/11/23 (P) 100/58   06/01/22 126/81   05/25/22 (!) 181/89     Wt Readings from Last 3 Encounters:   01/11/23 (P) 95.3 kg (210 lb)   06/01/22 111.4 kg (245 lb 9.5 oz)   05/25/22 110.9 kg (244 lb 9.6 oz)     BMI Readings from Last 3 Encounters:   01/11/23 (P) 41.01 kg/m?   06/01/22 47.96 kg/m?   05/25/22 47.77 kg/m?      Smoking Social History     Tobacco Use   Smoking Status Former    Current packs/day: 0.00    Average packs/day: 3.0 packs/day for 40.0 years (120.0  ttl pk-yrs)    Types: Cigarettes    Start date: 07/30/1958    Quit date: 07/30/1998    Years since quitting: 24.4   Smokeless Tobacco Former    Types: Chew    Quit date: 07/02/1997   Tobacco Comments    Quit 1999      Lipid Profile Cholesterol   Date Value Ref Range Status   01/19/2022 131  Final     HDL   Date Value Ref Range Status   01/19/2022 53  Final     LDL   Date Value Ref Range Status   01/19/2022 60  Final     Triglycerides   Date Value Ref Range Status   01/19/2022 94  Final      Blood Sugar Hemoglobin A1C   Date Value Ref Range Status   05/27/2022 6.5 (H) 4.0 - 5.7 % Final     Comment:     The ADA recommends that most patients with type 1 and type 2 diabetes maintain   an A1c level <7%.       Glucose   Date Value Ref Range Status   12/26/2022 139 (H) 70 - 105 Final   06/01/2022 110 (H) 70 - 100 MG/DL Final   16/05/9603 540 (H) 70 - 100 MG/DL Final     Glucose, POC   Date Value Ref Range Status   06/01/2022 298 (H) 70 - 100 MG/DL Final   98/06/9146 829 (H) 70 - 100 MG/DL Final   56/21/3086 578 (H) 70 - 100 MG/DL Final          Problems Addressed Today  Encounter Diagnoses   Name Primary?    Near syncope Yes    Hx of CABG     Primary hypertension     Mixed hyperlipidemia     Coronary artery disease due to lipid rich plaque     Type 2 diabetes mellitus with other circulatory complication, with long-term current use of insulin (HCC) Coronary artery disease involving native coronary artery of native heart with angina pectoris (HCC)     Presence of drug coated stent in left circumflex coronary artery     Spinal cord tumor     Morbid obesity (HCC)     Abnormal thallium stress test     Able to mobilize using indoor motorized wheelchair     Abdominal wall hernia        Assessment and Plan     Assessment:    1.  Chronic hypoxic respiratory failure oxygen dependent, currently 2-3 L/min, previously patient was using 6 L/min  She has undergone formal pulmonary evaluation  The lung pathology is complex and includes advanced, stage III COPD, lung nodules, restrictive lung disease, pulmonary fibrosis  2.  Hospital discharge follow-up  Patient was admitted at [facility] in April 2024 with an incarcerated ventral hernia and small bowel obstruction, she underwent emergent surgical intervention  3.  Chronic HFpEF - managed with diuretics, patient is unsure whether she takes furosemide or torsemide  A 2D echo Doppler study performed by [facility] on 12/06/2022-normal LVEF = 60%, no segmental WMA, no significant outflow gradient across the LVOT, concentric LVH, no significant valvular abnormalities.  4.  Coronary artery disease, status post CABG 1999  LIMA to LAD, SVG to OM branch, SVG to PDA  5.  Status post LHC in June 2020-atretic LIMA to diagonal graft, 100% occluded SVG to PDA, patent SVG to OM branch  6.  Status post PCI  to native mid/distal left circumflex with a DES on 01/29/2019  7.  Morbid obesity, motorized wheelchair dependency, elevated BMI = 41.01 kg/m?  It is notable that patient did have a weight loss of 38 pounds since February 2023  8.  Relative hypotension  Patient is on losartan for a history of hypertension  9. Multiple orthopedic issues status post left knee surgery complicated by hematoma that required surgical evacuation  10.  Osteoarthritis  Status post left hip surgery in January 2023 complicated by hematoma that required revision in April 2023  Currently, patient is unable to lift her arms very likely due to bilateral shoulder arthritis  11.  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  Cardiovascular evaluation preceding colonoscopy and possibly reintervention of the left hip    Plan:    1.  Decrease losartan to 25 mg p.o. daily  2.  Continue all other cardiac medications  3.  I asked both the patient and the husband to go home and thoroughly review her medication schedule and make sure that she does not take both furosemide and torsemide, I do recommend to continue torsemide 20 mg p.o. daily  4. Patient's Revised Cardiac Risk Index  is high for a low risk surgery.   Estimated risk of adverse outcomes (perioperative/postoperative events including myocardial infarction, pulmonary edema, ventricular fibrillation, cardiac arrest or complete heart block) with noncardiac surgery is  > 11% from a cardiac standpoint. This patient does not have any contraindication to undergo general anesthesia and the planned surgical procedure.  The full risks and benefits will have to be discussed with the Anesthesiologist  and the Surgeon that will perform the procedure.     Patient has an increased respiratory risk for general anesthesia and conscious sedation due to advanced pulmonary disease.  She does have chronic HFpEF,, however the most recent echocardiogram dated 12/06/2022 did not reveal any high risk features  Please call if further questions  5.  Follow-up office visit in 6 months             Current Medications (including today's revisions)   acetaminophen (TYLENOL) 325 mg tablet Take one tablet by mouth every 6 hours as needed for Pain.    albuterol (VENTOLIN HFA, PROAIR HFA) 90 mcg/Actuation IN inhaler Inhale two puffs by mouth into the lungs every 6 hours as needed.    albuterol-ipratropium (DUONEB) 0.5 mg-3 mg(2.5 mg base)/3 mL nebulizer solution Inhale 3 mL solution by nebulizer as directed twice daily as needed.    aspirin 81 mg cap Take 1 tablet by mouth every evening.    atorvastatin (LIPITOR) 40 mg tablet TAKE ONE TABLET BY MOUTH DAILY. INDICATIONS: HIGH CHOLESTEROL AND HIGH TRIGLYCERIDES (Patient taking differently: Take one tablet by mouth every evening.)    BIOTIN PO Take 1 tablet by mouth daily.    dexlansoprazole (DEXILANT) 30 mg capsule Take one capsule by mouth twice daily.    diclofenac sodium (VOLTAREN) 1 % topical gel Apply  topically to affected area daily as needed.    ferrous sulfate (FEOSOL) 325 mg (65 mg iron) tablet Take one tablet by mouth daily. Take on an empty stomach at least 1 hour before or 2 hours after food.    fluticasone (FLONASE) 50 mcg/actuation nasal spray Apply one spray to each nostril as directed every evening.    fluticasone-umeclidin-vilanter (TRELEGY ELLIPTA) 200-62.5-25 mcg inhaler Inhale one puff by mouth into the lungs daily.    furosemide (  LASIX) 20 mg tablet Take two tablets by mouth daily.    gabapentin (NEURONTIN) 100 mg capsule Take two capsules by mouth three times daily. (Patient taking differently: Take one capsule by mouth twice daily.)    HYDROcodone/acetaminophen (NORCO) 7.5/325 mg tablet Take one tablet by mouth every 8 hours as needed for Pain.    insulin lispro(+) 75/25 (HUMALOG MIX 75/25) 100 unit/mL (75/25) SC Susp Inject  under the skin as Needed. Sliding scale    lidocaine (LIDODERM) 5 % topical patch Apply one patch topically to affected area daily. Apply patch to Right shoulder for 12 hours, then remove for 12 hours before repeating.    losartan (COZAAR) 50 mg tablet Take one tablet by mouth daily. (Patient taking differently: Take two tablets by mouth daily.)    magnesium oxide (MAGOX) 400 mg (241.3 mg magnesium) tablet Take one tablet by mouth daily.    metFORMIN (GLUCOPHAGE) 1,000 mg tablet Take one tablet by mouth twice daily with meals. Resume on February 02, 2019 (Patient taking differently: Take one tablet by mouth every evening. Resume on February 02, 2019)    metoprolol succinate XL (TOPROL XL) 25 mg extended release tablet TAKE 1 TABLET BY MOUTH EVERY DAY    nystatin (NYSTOP) 100,000 unit/g topical powder Apply  topically to affected area four times daily.    omeprazole DR (PRILOSEC) 40 mg capsule Take one capsule by mouth daily.    polyethylene glycol 3350 (MIRALAX) 17 g packet Take one packet by mouth twice daily as needed.    torsemide (DEMADEX) 20 mg tablet Take one tablet by mouth daily.    zafirlukast(+) (ACCOLATE) 20 mg PO tablet Take one tablet by mouth twice daily.

## 2023-01-11 NOTE — Patient Instructions
Thank you for visiting our office today.    We would like to make the following medication adjustments:    Losartan 25mg  daily       Otherwise continue the same medications as you have been doing.          We will be pursuing the following tests after your appointment today:       Orders Placed This Encounter    losartan (COZAAR) 25 mg tablet         We will plan to see you back in 3 months.  Please call us in the meantime with any questions or concerns.        Please allow 5-7 business days for our providers to review your results. All normal results will go to MyChart. If you do not have Mychart, it is strongly recommended to get this so you can easily view all your results. If you do not have mychart, we will attempt to call you once with normal lab and testing results. If we cannot reach you by phone with normal results, we will send you a letter.  If you have not heard the results of your testing after one week please give [place] a call.       Your Cardiovascular Medicine [place] Team Brett Canales, Pilar Jarvis, Shawna Orleans, and Wilson)  phone number is [phone].

## 2023-01-28 ENCOUNTER — Inpatient Hospital Stay: Admit: 2023-01-28 | Discharge: 2023-01-28 | Payer: MEDICARE

## 2023-01-28 ENCOUNTER — Encounter: Admit: 2023-01-28 | Discharge: 2023-01-28 | Payer: MEDICARE

## 2023-01-28 ENCOUNTER — Inpatient Hospital Stay: Admit: 2023-01-28 | Payer: MEDICARE

## 2023-01-28 DIAGNOSIS — K56609 Unspecified intestinal obstruction, unspecified as to partial versus complete obstruction: Secondary | ICD-10-CM

## 2023-01-28 LAB — COMPREHENSIVE METABOLIC PANEL
ALBUMIN: 2.8 g/dL — ABNORMAL LOW (ref 3.5–5.0)
ALK PHOSPHATASE: 117 U/L — ABNORMAL HIGH (ref 25–110)
ALT: 13 U/L (ref 7–56)
ANION GAP: 10 (ref 3–12)
AST: 22 U/L (ref 7–40)
CO2: 25 MMOL/L (ref 21–30)
EGFR: 54 mL/min — ABNORMAL LOW (ref >60)
SODIUM: 127 MMOL/L — ABNORMAL LOW (ref 137–147)
TOTAL BILIRUBIN: 0.3 mg/dL — ABNORMAL LOW (ref 0.2–1.3)
TOTAL PROTEIN: 5.8 g/dL — ABNORMAL LOW (ref 6.0–8.0)

## 2023-01-28 LAB — MAGNESIUM: MAGNESIUM: 1.8 mg/dL — ABNORMAL LOW (ref 1.6–2.6)

## 2023-01-28 LAB — CBC: WBC COUNT: 7.1 K/UL (ref 4.5–11.0)

## 2023-01-28 LAB — POC GLUCOSE
POC GLUCOSE: 71 mg/dL (ref 70–100)
POC GLUCOSE: 91 mg/dL — ABNORMAL HIGH (ref <20.7)

## 2023-01-28 LAB — PROTIME INR (PT)
INR: 1.2 mg/dL — ABNORMAL LOW (ref 0.9–1.2)
PROTIME: 13 s — ABNORMAL HIGH (ref 10.2–12.9)

## 2023-01-28 LAB — LACTIC ACID(LACTATE): LACTIC ACID: 1.2 MMOL/L — ABNORMAL HIGH (ref 0.5–2.0)

## 2023-01-28 LAB — PHOSPHORUS: PHOSPHORUS: 3.5 mg/dL — ABNORMAL LOW (ref 2.0–4.5)

## 2023-01-28 LAB — IONIZED CALCIUM: IONIZED CALCIUM: 1 MMOL/L — ABNORMAL LOW (ref 1.0–1.3)

## 2023-01-28 MED ORDER — ENOXAPARIN 40 MG/0.4 ML SC SYRG
40 mg | Freq: Every day | SUBCUTANEOUS | 0 refills | Status: DC
Start: 2023-01-28 — End: 2023-01-28

## 2023-01-28 MED ORDER — PANTOPRAZOLE 40 MG IV SOLR
40 mg | Freq: Two times a day (BID) | INTRAVENOUS | 0 refills | Status: AC
Start: 2023-01-28 — End: ?
  Administered 2023-01-29 – 2023-01-30 (×3): 40 mg via INTRAVENOUS

## 2023-01-28 MED ORDER — INSULIN ASPART 100 UNIT/ML SC FLEXPEN
0-12 [IU] | Freq: Before meals | SUBCUTANEOUS | 0 refills | Status: AC
Start: 2023-01-28 — End: ?

## 2023-01-28 MED ORDER — METOPROLOL TARTRATE 5 MG/5 ML IV SOLN
6.25 mg | INTRAVENOUS | 0 refills | Status: AC
Start: 2023-01-28 — End: ?
  Administered 2023-01-29 – 2023-01-30 (×5): 6.25 mg via INTRAVENOUS

## 2023-01-28 MED ORDER — FENTANYL CITRATE (PF) 50 MCG/ML IJ SOLN
25-50 ug | INTRAVENOUS | 0 refills | Status: AC | PRN
Start: 2023-01-28 — End: ?
  Administered 2023-01-28 – 2023-01-29 (×4): 50 ug via INTRAVENOUS

## 2023-01-28 MED ORDER — MELATONIN 5 MG PO TAB
5 mg | Freq: Every evening | ORAL | 0 refills | Status: AC | PRN
Start: 2023-01-28 — End: ?
  Administered 2023-01-29 – 2023-01-30 (×2): 5 mg via ORAL

## 2023-01-28 MED ORDER — ENOXAPARIN 40 MG/0.4 ML SC SYRG
40 mg | Freq: Two times a day (BID) | SUBCUTANEOUS | 0 refills | Status: AC
Start: 2023-01-28 — End: ?
  Administered 2023-01-29 – 2023-01-30 (×4): 40 mg via SUBCUTANEOUS

## 2023-01-28 MED ORDER — UMECLIDINIUM-VILANTEROL 62.5-25 MCG/ACTUATION IN DSDV
1 | Freq: Every day | RESPIRATORY_TRACT | 0 refills | Status: AC
Start: 2023-01-28 — End: ?
  Administered 2023-01-29: 14:00:00 1 via RESPIRATORY_TRACT

## 2023-01-28 MED ORDER — ACETAMINOPHEN 1,000 MG/100 ML (10 MG/ML) IV SOLN
1000 mg | INTRAVENOUS | 0 refills | Status: AC
Start: 2023-01-28 — End: ?
  Administered 2023-01-28 – 2023-01-29 (×4): 1000 mg via INTRAVENOUS

## 2023-01-28 MED ORDER — DEXTROSE 50 % IN WATER (D50W) IV SYRG
12.5-25 g | INTRAVENOUS | 0 refills | Status: AC | PRN
Start: 2023-01-28 — End: ?
  Administered 2023-01-29: 14:00:00 25 mL via INTRAVENOUS

## 2023-01-28 MED ORDER — SENNOSIDES-DOCUSATE SODIUM 8.6-50 MG PO TAB
1 | Freq: Every day | ORAL | 0 refills | Status: AC | PRN
Start: 2023-01-28 — End: ?

## 2023-01-28 MED ORDER — FLUTICASONE FUROATE 200 MCG/ACTUATION IN DSDV
1 | Freq: Every day | RESPIRATORY_TRACT | 0 refills | Status: AC
Start: 2023-01-28 — End: ?
  Administered 2023-01-29: 14:00:00 1 via RESPIRATORY_TRACT

## 2023-01-28 MED ORDER — ONDANSETRON 4 MG PO TBDI
4 mg | ORAL | 0 refills | Status: AC | PRN
Start: 2023-01-28 — End: ?

## 2023-01-28 MED ORDER — ONDANSETRON HCL (PF) 4 MG/2 ML IJ SOLN
4 mg | INTRAVENOUS | 0 refills | Status: AC | PRN
Start: 2023-01-28 — End: ?
  Administered 2023-01-28: 21:00:00 4 mg via INTRAVENOUS

## 2023-01-28 MED ORDER — IPRATROPIUM-ALBUTEROL 0.5 MG-3 MG(2.5 MG BASE)/3 ML IN NEBU
3 mL | RESPIRATORY_TRACT | 0 refills | Status: AC | PRN
Start: 2023-01-28 — End: ?

## 2023-01-28 MED ORDER — POLYETHYLENE GLYCOL 3350 17 GRAM PO PWPK
1 | Freq: Every day | ORAL | 0 refills | Status: AC | PRN
Start: 2023-01-28 — End: ?

## 2023-01-28 MED ORDER — LACTATED RINGERS IV SOLP
INTRAVENOUS | 0 refills | Status: AC
Start: 2023-01-28 — End: ?
  Administered 2023-01-28 – 2023-01-29 (×2): 1000.000 mL via INTRAVENOUS

## 2023-01-28 NOTE — Progress Notes
Patient with PMH of COPD, chronic respiratory failure (baseline O2 3 lpm), previous tobacco abuse, CAD s/p CABG, HTN, HLD, HFpEF, DM2, and severe obesity presented to [facility] ED with complaints of abdominal pain, nausea, and vomiting. CT reveals multiple dilated loops of proximal small bowel with air-fluid levels and a transitional point in the RLQ consistent with SBO. The terminal ileum and colon appear decompressed. Patient has received 1 liter IVF, Promethazine, and NGT was inserted. This transfer request is initiated due to no surgical service available at [facility].

## 2023-01-28 NOTE — H&P (View-Only)
[facility] H&P  01/28/2023     Patient: Amy Bowman  MRN: [id number]    Admission Date:  01/28/2023 4:56 PM    Attending Surgeon: Ranelle Oyster, MD  Consult Performed by: Rocky Morel, MD    ASSESSMENT: Amy Bowman is an 82 year old female with PMH of COPD, chronic respiratory failure (baseline O2 3 lpm), previous tobacco abuse, CAD s/p CABG, HTN, HLD, HFpEF, DM2 recent ventral hernia with secondary small bowel obstruction s/p ex lap with ventral hernia repair (4/24).    PLAN:  - Admit to EGS, obtain labs  - NPO  - NGT decompression followed by [facility]  - MMPC  - mIVF  - PTA meds as appropriate  - DVT ppx w/ Lovenox    Rocky Morel, MD  Team Pager: [phone]  _____________________________________________________________________________    HPI: Amy Bowman is a 82 year old female transferred for concern for small bowel obstruction.  Patient reports abdominal pain that began 4 to 5 days ago.  The pain is located in her mid abdomen and associated with significant nausea and vomiting.  She presented to her local emergency department where CT obtained showed evidence of small bowel obstruction.  She reports a history of small bowel obstruction several months ago that was caused by a ventral hernia.  She underwent exploratory laparotomy with repair of her ventral hernia.  He denies chest pain or shortness of breath.  She denies fevers or chills.  She reports gray stools for the last several weeks as well.  She saw her primary care provider who told her this was likely due to iron supplementation she was started on.  Reports prior surgical history of open cholecystectomy as well as 2 C-sections in addition to her recent ventral hernia repair.  Has a history of coronary artery disease status post CABG and COPD.  She is not on anticoagulation.      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    HLD (hyperlipidemia)     HTN (hypertension)     Hyperlipemia     Memory difficulties 01/30/2019    has a hard time remembering new information or changes in medication/health care needs    Near-syncope     Obesity 06/02/2009    Personal history of CABG (coronary artery bypass graft) 06/02/2009    Polypharmacy 01/11/2023    S/P hernia surgery 01/11/2023    Small bowel obstruction (HCC) 01/11/2023    Type II diabetes mellitus (HCC)     Vision decreased      Surgical History:   Procedure Laterality Date    HIP REPLACEMENT Bilateral 2000    CYST REMOVAL  2011    THORACIC LAMINECTOMY  08/2011    removal of spinal cord tumor    TUMOR REMOVAL  2013    SPINE     ANGIOGRAPHY CORONARY ARTERY WITH LEFT HEART CATHETERIZATION N/A 01/29/2019    Performed by Myriam Jacobson, MD at [facility] CATH LAB    POSSIBLE PERCUTANEOUS CORONARY STENT PLACEMENT WITH ANGIOPLASTY N/A 01/29/2019    Performed by Myriam Jacobson, MD at [facility] CATH LAB    ESOPHAGOGASTRODUODENOSCOPY WITH BIOPSY - FLEXIBLE N/A 05/30/2022    Performed by Buckles, Vinnie Level, MD at [facility] ENDO    ESOPHAGOGASTRODUODENOSCOPY WITH TRANSENDOSCOPIC BALLOON  DILATION - LESS THAN 30 MM DIAMETER  05/30/2022    Performed by Buckles, Vinnie Level, MD at [facility] ENDO    CESAREAN SECTION      x2    DOPPLER ECHOCARDIOGRAPHY      ELECTROCARDIOGRAM      HX APPENDECTOMY      HX CORONARY ARTERY BYPASS GRAFT      LEFT HEART CATHETERIZATION      MYOCARDIAL PERFUSION IMG STUDY      SHOULDER SURGERY Right     2011     Family History   Problem Relation Name Age of Onset    Cancer Mother      Cancer-Lung Mother      Arthritis-osteo Mother      Cancer Father      Cancer-Lung Father      High Cholesterol Father      Cancer-Lung Sister      Arthritis-rheumatoid Sister      Stroke Brother       Social History     Tobacco Use    Smoking status: Former     Current packs/day: 0.00     Average packs/day: 3.0 packs/day for 40.0 years (120.0 ttl pk-yrs)     Types: Cigarettes     Start date: 07/30/1958 Quit date: 07/30/1998     Years since quitting: 24.5    Smokeless tobacco: Former     Types: Chew     Quit date: 07/02/1997    Tobacco comments:     Quit 1999   Substance Use Topics    Alcohol use: Yes     Comment: rarely     Your Current Medications:         Instructions    acetaminophen (TYLENOL) 325 mg tablet Take one tablet by mouth every 6 hours as needed for Pain.    albuterol (VENTOLIN HFA, PROAIR HFA) 90 mcg/Actuation IN inhaler Inhale two puffs by mouth into the lungs every 6 hours as needed.    albuterol-ipratropium (DUONEB) 0.5 mg-3 mg(2.5 mg base)/3 mL nebulizer solution Inhale 3 mL solution by nebulizer as directed twice daily as needed.    aspirin 81 mg cap Take 1 tablet by mouth every evening.    atorvastatin (LIPITOR) 40 mg tablet TAKE ONE TABLET BY MOUTH DAILY. INDICATIONS: HIGH CHOLESTEROL AND HIGH TRIGLYCERIDES    BIOTIN PO Take 1 tablet by mouth daily.    dexlansoprazole (DEXILANT) 30 mg capsule Take one capsule by mouth twice daily.    diclofenac sodium (VOLTAREN) 1 % topical gel Apply  topically to affected area daily as needed.    ferrous sulfate (FEOSOL) 325 mg (65 mg iron) tablet Take one tablet by mouth daily. Take on an empty stomach at least 1 hour before or 2 hours after food.    fluticasone (FLONASE) 50 mcg/actuation nasal spray Apply one spray to each nostril as directed every evening.    fluticasone-umeclidin-vilanter (TRELEGY ELLIPTA) 200-62.5-25 mcg inhaler Inhale one puff by mouth into the lungs daily.    gabapentin (NEURONTIN) 100 mg capsule Take two capsules by mouth three times daily.    HYDROcodone/acetaminophen (NORCO) 7.5/325 mg tablet Take one tablet by mouth every 8 hours as needed for Pain.    insulin lispro(+) 75/25 (HUMALOG MIX 75/25) 100 unit/mL (75/25) SC Susp Inject  under the skin as Needed. Sliding scale    lidocaine (LIDODERM) 5 % topical patch Apply one patch topically to affected area daily. Apply patch to Right shoulder for 12 hours, then remove for  12 hours before repeating.    losartan (COZAAR) 25 mg tablet Take one tablet by mouth daily.    magnesium oxide (MAGOX) 400 mg (241.3 mg magnesium) tablet Take one tablet by mouth daily.    metFORMIN (GLUCOPHAGE) 1,000 mg tablet Take one tablet by mouth twice daily with meals. Resume on February 02, 2019    metoprolol succinate XL (TOPROL XL) 25 mg extended release tablet TAKE 1 TABLET BY MOUTH EVERY DAY    nystatin (NYSTOP) 100,000 unit/g topical powder Apply  topically to affected area four times daily.    omeprazole DR (PRILOSEC) 40 mg capsule Take one capsule by mouth daily.    polyethylene glycol 3350 (MIRALAX) 17 g packet Take one packet by mouth twice daily as needed.    torsemide (DEMADEX) 20 mg tablet Take one tablet by mouth daily.    zafirlukast(+) (ACCOLATE) 20 mg PO tablet Take one tablet by mouth twice daily.            ROS  Review of Systems   Constitutional:  Negative for chills and fever.   Respiratory:  Negative for shortness of breath.    Cardiovascular:  Negative for chest pain.   Gastrointestinal:  Positive for abdominal pain, nausea and vomiting. Negative for blood in stool, constipation and diarrhea.   Genitourinary:  Negative for dysuria.       Vitals:  BP: (P) 116/69  Temp:  [36.4 ?C (97.5 ?F)]   Pulse:  [88]   Respirations:  [20 PER MINUTE]   SpO2:  [92 %]   O2 Device: (P) Nasal cannula  O2 Liter Flow: (P) 3 Lpm    Physical Exam:   Physical Exam  Constitutional:       Appearance: She is obese.   HENT:      Head: Normocephalic and atraumatic.      Nose: Nose normal.   Pulmonary:      Effort: Pulmonary effort is normal.   Abdominal:      General: There is distension.      Palpations: Abdomen is soft.      Tenderness: There is abdominal tenderness.      Comments: Healed surgical incisions   Musculoskeletal:         General: Normal range of motion.      Cervical back: Normal range of motion.   Skin:     General: Skin is warm and dry.   Neurological:      General: No focal deficit present.      Mental Status: She is alert and oriented to person, place, and time.   Psychiatric:         Mood and Affect: Mood normal.         Behavior: Behavior normal.         Lab/Radiology/Other Diagnostic Tests:  Lab Results   Component Value Date/Time    HGB 9.9 (L) 12/26/2022 0000    HCT 30.7 (L) 12/26/2022 0000    WBC 6.82 12/26/2022 0000    INR 1.2 (H) 06/02/2009 1127    PLTCT 367 12/26/2022 0000     Lab Results   Component Value Date/Time    NA 134 (L) 12/26/2022 0000    K 4.6 12/26/2022 0000    CL 98 12/26/2022 0000    CO2 25.0 12/26/2022 0000    BUN 18.2 12/26/2022 0000    CR 0.86 12/26/2022 0000     FEEDING TUBE PLCMNT (ABD/CHEST LMTD)    (Results Pending)

## 2023-01-29 ENCOUNTER — Encounter: Admit: 2023-01-29 | Discharge: 2023-01-29 | Payer: MEDICARE

## 2023-01-29 MED ADMIN — HYDROXYZINE HCL 50 MG/ML IM SOLN [3770]: 50 mg | INTRAMUSCULAR | @ 07:00:00 | Stop: 2023-01-29 | NDC 00517560125

## 2023-01-29 MED ADMIN — MAGNESIUM SULFATE IN WATER 4 GRAM/50 ML (8 %) IV PGBK [166563]: 4 g | INTRAVENOUS | @ 14:00:00 | Stop: 2023-01-29 | NDC 00264420552

## 2023-01-29 NOTE — Progress Notes
RT Adult Assessment Note    NAME:Amy Bowman             MRN: [id number]             DOB:19-Sep-1940          AGE: 82 y.o.  ADMISSION DATE: 01/28/2023             DAYS ADMITTED: LOS: 0 days    Additional Comments:  Impressions of the patient: Patient resting in bed in no distress. Patient states she has a history of Asthma & COPD and takes an Albuterol inhaler PRN & Trelegy QDAY. She also denies OSA history but states she is on 3L of O2 at a continuous flow.  Intervention(s)/outcome(s): See RT Orders  Patient education that was completed: None at this time  Recommendations to the care team: None at this time    Vital Signs:  Pulse: 86  RR: 20 PER MINUTE  SpO2: 99 %  O2 Device: Nasal cannula  Liter Flow: 3 Lpm (home baseline)  O2%:      Breath Sounds:   All Breath Sounds: Decreased  Respiratory Effort:   Respiratory WDL: Within Defined Limits  Respiratory Effort/Pattern: Unlabored  Comments:

## 2023-01-30 MED ADMIN — PANTOPRAZOLE 40 MG PO TBEC [80436]: 40 mg | ORAL | @ 17:00:00 | Stop: 2023-01-30 | NDC 00904647461

## 2023-01-30 MED ADMIN — METOPROLOL SUCCINATE 25 MG PO TB24 [81866]: 25 mg | ORAL | @ 14:00:00 | Stop: 2023-01-30 | NDC 00904632261

## 2023-01-30 MED ADMIN — ACETAMINOPHEN 325 MG PO TAB [101]: 650 mg | ORAL | @ 06:00:00 | Stop: 2023-01-30 | NDC 00904677361

## 2023-01-30 MED ADMIN — METHOCARBAMOL 750 MG PO TAB [4972]: 750 mg | ORAL | @ 06:00:00 | Stop: 2023-01-30 | NDC 70010077005

## 2023-01-30 MED ADMIN — NYSTATIN 100,000 UNIT/GRAM TP POWD [39136]: TOPICAL | @ 03:00:00 | NDC 00832046515

## 2023-01-30 MED ADMIN — METHOCARBAMOL 750 MG PO TAB [4972]: 750 mg | ORAL | @ 14:00:00 | Stop: 2023-01-30 | NDC 70010077005

## 2023-02-10 ENCOUNTER — Encounter: Admit: 2023-02-10 | Discharge: 2023-02-10 | Payer: MEDICARE

## 2023-02-10 NOTE — Progress Notes
82yo female with h/o CAD, s/p CABG, s/p PCI w/stents, chronic hypoxemic respiratory failure oxygen dependent at 2-3 L/min, severe COPD, DM2 requiring insulin, HTN, HLD, decreased mobility, patient does use a motorized wheelchair, s/p several orthopedic interventions, s/p left hip previous surgical intervention with unremitting pain, h/o incarcerated ventral hernia, SBO, s/p emergent surgical intervention in April 2024 at 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 Was dc'd from [place] on 01/30/23 after NGT decompression for a SBO. Pt returns to the ED today with recurrent SBO.     Sending requesting transfer back to [place] since surgery services are not available

## 2023-02-11 ENCOUNTER — Encounter: Admit: 2023-02-11 | Discharge: 2023-02-11 | Payer: MEDICARE

## 2023-02-24 ENCOUNTER — Inpatient Hospital Stay: Admit: 2023-02-24 | Discharge: 2023-02-24 | Payer: MEDICARE

## 2023-02-24 ENCOUNTER — Encounter: Admit: 2023-02-24 | Discharge: 2023-02-24 | Payer: MEDICARE

## 2023-02-24 DIAGNOSIS — J45909 Unspecified asthma, uncomplicated: Secondary | ICD-10-CM

## 2023-02-24 DIAGNOSIS — E119 Type 2 diabetes mellitus without complications: Secondary | ICD-10-CM

## 2023-02-24 DIAGNOSIS — M549 Dorsalgia, unspecified: Secondary | ICD-10-CM

## 2023-02-24 DIAGNOSIS — K439 Ventral hernia without obstruction or gangrene: Secondary | ICD-10-CM

## 2023-02-24 DIAGNOSIS — H547 Unspecified visual loss: Secondary | ICD-10-CM

## 2023-02-24 DIAGNOSIS — E785 Hyperlipidemia, unspecified: Secondary | ICD-10-CM

## 2023-02-24 DIAGNOSIS — Z79899 Other long term (current) drug therapy: Secondary | ICD-10-CM

## 2023-02-24 DIAGNOSIS — K56609 Unspecified intestinal obstruction, unspecified as to partial versus complete obstruction: Secondary | ICD-10-CM

## 2023-02-24 DIAGNOSIS — I10 Essential (primary) hypertension: Secondary | ICD-10-CM

## 2023-02-24 DIAGNOSIS — I2510 Atherosclerotic heart disease of native coronary artery without angina pectoris: Secondary | ICD-10-CM

## 2023-02-24 DIAGNOSIS — Z9889 Other specified postprocedural states: Secondary | ICD-10-CM

## 2023-02-24 DIAGNOSIS — R413 Other amnesia: Secondary | ICD-10-CM

## 2023-02-24 DIAGNOSIS — J439 Emphysema, unspecified: Secondary | ICD-10-CM

## 2023-02-24 DIAGNOSIS — M1990 Unspecified osteoarthritis, unspecified site: Secondary | ICD-10-CM

## 2023-02-24 DIAGNOSIS — E669 Obesity, unspecified: Secondary | ICD-10-CM

## 2023-02-24 DIAGNOSIS — T81328D Dehiscence of closure of fascia, superficial or muscular, subsequent encounter: Secondary | ICD-10-CM

## 2023-02-24 LAB — BASIC METABOLIC PANEL
ANION GAP: 15 pg — ABNORMAL HIGH (ref 3–12)
BLD UREA NITROGEN: 33 mg/dL — ABNORMAL HIGH (ref 7–25)
CALCIUM: 7.6 mg/dL — ABNORMAL LOW (ref 8.5–10.6)
CHLORIDE: 101 MMOL/L (ref 98–110)
CO2: 22 MMOL/L (ref 21–30)
CREATININE: 1.1 mg/dL — ABNORMAL HIGH (ref 0.4–1.00)
EGFR: 48 mL/min — ABNORMAL LOW (ref >60)
GLUCOSE,PANEL: 145 mg/dL — ABNORMAL HIGH (ref 70–100)
SODIUM: 138 MMOL/L (ref 137–147)

## 2023-02-24 LAB — PHOSPHORUS: PHOSPHORUS: 4.6 mg/dL — ABNORMAL HIGH (ref 2.0–4.5)

## 2023-02-24 LAB — MAGNESIUM
MAGNESIUM: 0.6 mg/dL — CL (ref 1.6–2.6)
MAGNESIUM: 0.6 mg/dL — CL (ref 1.6–2.6)
MAGNESIUM: 2.2 mg/dL (ref 1.6–2.6)

## 2023-02-24 LAB — LACTIC ACID(LACTATE): LACTIC ACID: 1.5 MMOL/L (ref 0.5–2.0)

## 2023-02-24 LAB — CBC: WBC COUNT: 16 K/UL — ABNORMAL HIGH (ref 4.5–11.0)

## 2023-02-24 MED ORDER — ACETAMINOPHEN 500 MG PO TAB
1000 mg | ORAL | 0 refills | Status: DC | PRN
Start: 2023-02-24 — End: 2023-02-25

## 2023-02-24 MED ORDER — MAGNESIUM SULFATE IN WATER 4 GRAM/50 ML (8 %) IV PGBK
4 g | Freq: Once | INTRAVENOUS | 0 refills | Status: CP
Start: 2023-02-24 — End: ?
  Administered 2023-02-24: 22:00:00 4 g via INTRAVENOUS

## 2023-02-24 MED ORDER — FENTANYL CITRATE (PF) 50 MCG/ML IJ SOLN
25 ug | INTRAVENOUS | 0 refills | Status: DC | PRN
Start: 2023-02-24 — End: 2023-02-27
  Administered 2023-02-24 – 2023-02-25 (×5): 25 ug via INTRAVENOUS

## 2023-02-24 MED ORDER — LACTATED RINGERS IV SOLP
INTRAVENOUS | 0 refills | Status: DC
Start: 2023-02-24 — End: 2023-02-26
  Administered 2023-02-24 – 2023-02-26 (×4): 1000.0000 mL via INTRAVENOUS

## 2023-02-24 MED ORDER — POLYETHYLENE GLYCOL 3350 17 GRAM PO PWPK
1 | Freq: Once | ORAL | 0 refills | Status: AC
Start: 2023-02-24 — End: ?

## 2023-02-24 MED ORDER — METOPROLOL TARTRATE 5 MG/5 ML IV SOLN
2.5 mg | INTRAVENOUS | 0 refills | Status: DC
Start: 2023-02-24 — End: 2023-02-28
  Administered 2023-02-24 – 2023-02-28 (×16): 2.5 mg via INTRAVENOUS

## 2023-02-24 MED ORDER — FENTANYL CITRATE (PF) 50 MCG/ML IJ SOLN
25-50 ug | INTRAVENOUS | 0 refills | Status: DC | PRN
Start: 2023-02-24 — End: 2023-02-24

## 2023-02-24 MED ORDER — MELATONIN 5 MG PO TAB
5 mg | Freq: Every evening | ORAL | 0 refills | Status: DC | PRN
Start: 2023-02-24 — End: 2023-03-02
  Administered 2023-02-28 – 2023-03-02 (×3): 5 mg via ORAL

## 2023-02-24 MED ORDER — ONDANSETRON HCL (PF) 4 MG/2 ML IJ SOLN
4 mg | INTRAVENOUS | 0 refills | Status: DC | PRN
Start: 2023-02-24 — End: 2023-02-24

## 2023-02-24 MED ORDER — ONDANSETRON HCL (PF) 4 MG/2 ML IJ SOLN
4 mg | INTRAVENOUS | 0 refills | Status: DC | PRN
Start: 2023-02-24 — End: 2023-03-08
  Administered 2023-02-26 – 2023-03-07 (×7): 4 mg via INTRAVENOUS

## 2023-02-24 MED ORDER — SENNOSIDES-DOCUSATE SODIUM 8.6-50 MG PO TAB
1 | Freq: Two times a day (BID) | ORAL | 0 refills | Status: DC
Start: 2023-02-24 — End: 2023-02-28
  Administered 2023-02-27 – 2023-02-28 (×2): 1 via ORAL

## 2023-02-24 MED ORDER — ONDANSETRON 8 MG PO TBDI
8 mg | ORAL | 0 refills | Status: DC | PRN
Start: 2023-02-24 — End: 2023-03-08
  Administered 2023-03-02: 14:00:00 8 mg via ORAL

## 2023-02-24 MED ORDER — ENOXAPARIN 40 MG/0.4 ML SC SYRG
40 mg | Freq: Every day | SUBCUTANEOUS | 0 refills | Status: DC
Start: 2023-02-24 — End: 2023-02-26
  Administered 2023-02-25 – 2023-02-26 (×2): 40 mg via SUBCUTANEOUS

## 2023-02-24 MED ORDER — ACETAMINOPHEN 1,000 MG/100 ML (10 MG/ML) IV SOLN
1000 mg | Freq: Once | INTRAVENOUS | 0 refills | Status: CP
Start: 2023-02-24 — End: ?
  Administered 2023-02-24: 21:00:00 1000 mg via INTRAVENOUS

## 2023-02-24 MED ORDER — UMECLIDINIUM-VILANTEROL 62.5-25 MCG/ACTUATION IN DSDV
1 | Freq: Every day | RESPIRATORY_TRACT | 0 refills | Status: DC
Start: 2023-02-24 — End: 2023-03-08
  Administered 2023-02-24 – 2023-03-03 (×2): 1 via RESPIRATORY_TRACT

## 2023-02-24 MED ORDER — ONDANSETRON 4 MG PO TBDI
4 mg | ORAL | 0 refills | Status: DC | PRN
Start: 2023-02-24 — End: 2023-02-24

## 2023-02-24 MED ORDER — FLUTICASONE FUROATE 200 MCG/ACTUATION IN DSDV
1 | Freq: Every day | RESPIRATORY_TRACT | 0 refills | Status: DC
Start: 2023-02-24 — End: 2023-03-08
  Administered 2023-02-24 – 2023-03-03 (×2): 1 via RESPIRATORY_TRACT

## 2023-02-24 MED ORDER — ALBUTEROL SULFATE 90 MCG/ACTUATION IN HFAA
2 | RESPIRATORY_TRACT | 0 refills | Status: DC | PRN
Start: 2023-02-24 — End: 2023-03-08

## 2023-02-24 NOTE — Procedures
Vascular Access Team consulted to obtain lab specimen.    Ultrasound Used: Yes    How Many Attempts: 1    Location of Unsuccessful Attempts: NA    At 1710 approximately 6 ml of blood obtained from RAC. Patient tolerated the procedure well. Specimen labeled and sent to lab

## 2023-02-24 NOTE — H&P (View-Only)
[facility] H&P  02/24/2023     Patient: Amy Bowman  MRN: [id number]    Admission Date:  02/24/2023 3:41 PM    Attending Surgeon: Estella Husk, MD  Consult Performed by: Rocky Morel, MD    ASSESSMENT: Amy Bowman is an 82 year old female with PMH of COPD, chronic respiratory failure (baseline O2 3 lpm), previous tobacco abuse, CAD s/p CABG, HTN, HLD, HFpEF, DM2 recent ventral hernia with secondary small bowel obstruction s/p ex lap with ventral hernia repair (4/24) now with recurrent SBO.    PLAN:  - Admit to EGS, obtain labs  - Given this is her third obstruction in 2 months it is reasonable to proceed with operative intervention.  - Tentatively plan for OR tomorrow  - NPO  - NGT decompression  - MMPC  - mIVF  - PTA meds as appropriate  - DVT ppx w/ Lovenox    Rocky Morel, MD  Team Pager: [phone]  _____________________________________________________________________________    HPI: Amy Bowman is a 82 year old female who presents as a transfer due to recurrent small bowel obstruction.  Patient reports that yesterday she developed increased abdominal pain and distention and then had associated nausea and vomiting.  Patient has a history of small bowel obstructions and was recently admitted to  last month.  She was treated conservatively with NG tube decompression and had return of bowel function.  Several weeks later she again presented to her local emergency department with small bowel obstruction and was treated conservatively with NG tube and decompression.  She denies chest pain or shortness of breath.  She denies fevers or chills.  Prior abdominal surgery of ventral hernia repair earlier this year as well as prior open cholecystectomy and 2 C-sections.  She is not on anticoagulation.    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    HLD (hyperlipidemia)     HTN (hypertension)     Hyperlipemia     Memory difficulties 01/30/2019    has a hard time remembering new information or changes in medication/health care needs    Near-syncope     Obesity 06/02/2009    Personal history of CABG (coronary artery bypass graft) 06/02/2009    Polypharmacy 01/11/2023    S/P hernia surgery 01/11/2023    Small bowel obstruction (HCC) 01/11/2023    Type II diabetes mellitus (HCC)     Vision decreased      Surgical History:   Procedure Laterality Date    HIP REPLACEMENT Bilateral 2000    CYST REMOVAL  2011    THORACIC LAMINECTOMY  08/2011    removal of spinal cord tumor    TUMOR REMOVAL  2013    SPINE     ANGIOGRAPHY CORONARY ARTERY WITH LEFT HEART CATHETERIZATION N/A 01/29/2019    Performed by Myriam Jacobson, MD at [facility] CATH LAB    POSSIBLE PERCUTANEOUS CORONARY STENT PLACEMENT WITH ANGIOPLASTY N/A 01/29/2019    Performed by Myriam Jacobson, MD at [facility] CATH LAB    ESOPHAGOGASTRODUODENOSCOPY WITH BIOPSY - FLEXIBLE N/A 05/30/2022    Performed by Buckles, Vinnie Level, MD at [facility] ENDO    ESOPHAGOGASTRODUODENOSCOPY WITH TRANSENDOSCOPIC BALLOON DILATION - LESS THAN 30 MM DIAMETER  05/30/2022    Performed  by Buckles, Vinnie Level, MD at [facility] ENDO    CESAREAN SECTION      x2    DOPPLER ECHOCARDIOGRAPHY      ELECTROCARDIOGRAM      HX APPENDECTOMY      HX CORONARY ARTERY BYPASS GRAFT      LEFT HEART CATHETERIZATION      MYOCARDIAL PERFUSION IMG STUDY      SHOULDER SURGERY Right     2011     Family History   Problem Relation Name Age of Onset    Cancer Mother      Cancer-Lung Mother      Arthritis-osteo Mother      Cancer Father      Cancer-Lung Father      High Cholesterol Father      Cancer-Lung Sister      Arthritis-rheumatoid Sister      Stroke Brother       Social History     Tobacco Use    Smoking status: Former     Current packs/day: 0.00     Average packs/day: 3.0 packs/day for 40.0 years (120.0 ttl pk-yrs)     Types: Cigarettes     Start date: 07/30/1958     Quit date: 07/30/1998 Years since quitting: 24.5    Smokeless tobacco: Former     Types: Chew     Quit date: 07/02/1997    Tobacco comments:     Quit 1999   Substance Use Topics    Alcohol use: Yes     Comment: rarely     Your Current Medications:         Instructions    acetaminophen (TYLENOL) 325 mg tablet Take one tablet by mouth every 6 hours as needed for Pain.    albuterol (VENTOLIN HFA, PROAIR HFA) 90 mcg/Actuation IN inhaler Inhale two puffs by mouth into the lungs every 6 hours as needed.    albuterol-ipratropium (DUONEB) 0.5 mg-3 mg(2.5 mg base)/3 mL nebulizer solution Inhale 3 mL solution by nebulizer as directed twice daily as needed.    aspirin 81 mg cap Take 1 tablet by mouth every evening.    atorvastatin (LIPITOR) 40 mg tablet TAKE ONE TABLET BY MOUTH DAILY. INDICATIONS: HIGH CHOLESTEROL AND HIGH TRIGLYCERIDES    BIOTIN PO Take 1 tablet by mouth daily.    dexlansoprazole (DEXILANT) 30 mg capsule Take one capsule by mouth twice daily.    diclofenac sodium (VOLTAREN) 1 % topical gel Apply  topically to affected area daily as needed.    ferrous sulfate (FEOSOL) 325 mg (65 mg iron) tablet Take one tablet by mouth daily. Take on an empty stomach at least 1 hour before or 2 hours after food.    fluticasone (FLONASE) 50 mcg/actuation nasal spray Apply one spray to each nostril as directed every evening.    fluticasone-umeclidin-vilanter (TRELEGY ELLIPTA) 200-62.5-25 mcg inhaler Inhale one puff by mouth into the lungs daily.    gabapentin (NEURONTIN) 100 mg capsule Take two capsules by mouth three times daily.    HYDROcodone/acetaminophen (NORCO) 7.5/325 mg tablet Take one tablet by mouth every 8 hours as needed for Pain.    insulin lispro(+) 75/25 (HUMALOG MIX 75/25) 100 unit/mL (75/25) SC Susp Inject  under the skin as Needed. Sliding scale    lidocaine (LIDODERM) 5 % topical patch Apply one patch topically to affected area daily. Apply patch to Right shoulder for 12 hours, then remove for 12 hours before repeating.    losartan (COZAAR) 25 mg tablet Take  one tablet by mouth daily.    magnesium oxide (MAGOX) 400 mg (241.3 mg magnesium) tablet Take one tablet by mouth daily.    metFORMIN-XR (GLUCOPHAGE XR) 500 mg extended release tablet Take two tablets by mouth daily with dinner.    metoprolol succinate XL (TOPROL XL) 25 mg extended release tablet TAKE 1 TABLET BY MOUTH EVERY DAY    nystatin (NYSTOP) 100,000 unit/g topical powder Apply  topically to affected area four times daily.    polyethylene glycol 3350 (MIRALAX) 17 g packet Take one packet by mouth twice daily as needed.    torsemide (DEMADEX) 20 mg tablet Take one tablet by mouth daily.    zafirlukast(+) (ACCOLATE) 20 mg PO tablet Take one tablet by mouth twice daily.            ROS  Review of Systems   Constitutional:  Negative for chills and fever.   Respiratory:  Negative for shortness of breath.    Cardiovascular:  Negative for chest pain.   Gastrointestinal:  Positive for abdominal pain, nausea and vomiting. Negative for blood in stool.       Vitals:  BP: (121)/(68)   Temp:  [36.5 ?C (97.7 ?F)]   Pulse:  [135]   Respirations:  [18 PER MINUTE]   SpO2:  [100 %]   O2 Device: Nasal cannula  O2 Liter Flow: 3 Lpm    Physical Exam:   Physical Exam  Constitutional:       Appearance: Normal appearance.   HENT:      Head: Normocephalic and atraumatic.      Nose: Nose normal.   Pulmonary:      Effort: Pulmonary effort is normal.   Abdominal:      General: There is distension.      Palpations: Abdomen is soft.      Tenderness: There is abdominal tenderness.      Comments: Well healed surgical scars   Musculoskeletal:         General: Normal range of motion.      Cervical back: Normal range of motion.   Skin:     General: Skin is warm and dry.   Neurological:      General: No focal deficit present.      Mental Status: She is alert and oriented to person, place, and time.   Psychiatric:         Mood and Affect: Mood normal.         Behavior: Behavior normal.         Lab/Radiology/Other Diagnostic Tests:  Lab Results   Component Value Date/Time    HGB 12.9 02/24/2023 1509    HCT 40.4 02/24/2023 1509    WBC 16.3 (H) 02/24/2023 1509    INR 1.2 01/28/2023 1649    PLTCT 443 (H) 02/24/2023 1509     Lab Results   Component Value Date/Time    NA 131 (L) 01/30/2023 0601    K 3.8 01/30/2023 0601    CL 98 01/30/2023 0601    CO2 27 01/30/2023 0601    BUN 27 (H) 01/30/2023 0601    CR 0.97 01/30/2023 0601     FEEDING TUBE PLCMNT (ABD/CHEST LMTD)    (Results Pending)

## 2023-02-24 NOTE — Progress Notes
RT Adult Assessment Note    NAME:Amy Bowman             MRN: [id number]             DOB:25-Jan-1941          AGE: 82 y.o.  ADMISSION DATE: 02/24/2023             DAYS ADMITTED: LOS: 0 days    Additional Comments:  Impressions of the patient: pt resting on 3L O2     Vital Signs:  Pulse:    RR: 18 PER MINUTE  SpO2: 98 %  O2 Device: Nasal cannula  Liter Flow: 3 Lpm (pt states O2 3LPM per home regimen)  O2%:      Breath Sounds:      Respiratory Effort:      Comments:

## 2023-02-24 NOTE — Procedures
Vascular Access Team consulted to obtain lab specimen.    Ultrasound Used: Yes    How Many Attempts: 1    Location of Unsuccessful Attempts: NA    At 1614 approximately 6 ml of blood obtained from RAC. Patient tolerated the procedure well. Specimen labeled and sent to lab

## 2023-02-25 ENCOUNTER — Encounter: Admit: 2023-02-25 | Discharge: 2023-02-25 | Payer: MEDICARE

## 2023-02-25 ENCOUNTER — Inpatient Hospital Stay: Admit: 2023-02-25 | Discharge: 2023-02-25 | Payer: MEDICARE

## 2023-02-25 DIAGNOSIS — I10 Essential (primary) hypertension: Secondary | ICD-10-CM

## 2023-02-25 DIAGNOSIS — Z79899 Other long term (current) drug therapy: Secondary | ICD-10-CM

## 2023-02-25 DIAGNOSIS — R413 Other amnesia: Secondary | ICD-10-CM

## 2023-02-25 DIAGNOSIS — E119 Type 2 diabetes mellitus without complications: Secondary | ICD-10-CM

## 2023-02-25 DIAGNOSIS — M1990 Unspecified osteoarthritis, unspecified site: Secondary | ICD-10-CM

## 2023-02-25 DIAGNOSIS — E669 Obesity, unspecified: Secondary | ICD-10-CM

## 2023-02-25 DIAGNOSIS — J45909 Unspecified asthma, uncomplicated: Secondary | ICD-10-CM

## 2023-02-25 DIAGNOSIS — H547 Unspecified visual loss: Secondary | ICD-10-CM

## 2023-02-25 DIAGNOSIS — K56609 Unspecified intestinal obstruction, unspecified as to partial versus complete obstruction: Secondary | ICD-10-CM

## 2023-02-25 DIAGNOSIS — M549 Dorsalgia, unspecified: Secondary | ICD-10-CM

## 2023-02-25 DIAGNOSIS — K439 Ventral hernia without obstruction or gangrene: Secondary | ICD-10-CM

## 2023-02-25 DIAGNOSIS — J439 Emphysema, unspecified: Secondary | ICD-10-CM

## 2023-02-25 DIAGNOSIS — E785 Hyperlipidemia, unspecified: Secondary | ICD-10-CM

## 2023-02-25 DIAGNOSIS — I2510 Atherosclerotic heart disease of native coronary artery without angina pectoris: Secondary | ICD-10-CM

## 2023-02-25 DIAGNOSIS — Z9889 Other specified postprocedural states: Secondary | ICD-10-CM

## 2023-02-25 LAB — C DIFFICILE BY PCR: C. DIFF TOXIN B PCR: NEGATIVE

## 2023-02-25 LAB — TYPE & CROSSMATCH
ANTIBODY SCREEN: NEGATIVE
UNITS ORDERED: 0

## 2023-02-25 LAB — POC GLUCOSE
POC GLUCOSE: 69 mg/dL — ABNORMAL LOW (ref 70–100)
POC GLUCOSE: 91 mg/dL (ref 70–100)
POC GLUCOSE: 110 mg/dL — ABNORMAL HIGH (ref 70–100)
POC GLUCOSE: 128 mg/dL — ABNORMAL HIGH (ref 70–100)
POC GLUCOSE: 116 mg/dL — ABNORMAL HIGH (ref 70–100)

## 2023-02-25 MED ORDER — SUGAMMADEX 100 MG/ML IV SOLN
INTRAVENOUS | 0 refills | Status: DC
Start: 2023-02-25 — End: 2023-02-25

## 2023-02-25 MED ORDER — ROCURONIUM 10 MG/ML IV SOLN
INTRAVENOUS | 0 refills | Status: DC
Start: 2023-02-25 — End: 2023-02-25

## 2023-02-25 MED ORDER — LIDOCAINE (PF) 10 MG/ML (1 %) IJ SOLN
.2 mL | INTRAMUSCULAR | 0 refills | Status: DC | PRN
Start: 2023-02-25 — End: 2023-02-25

## 2023-02-25 MED ORDER — SODIUM CHLORIDE 0.9 % IR SOLN
0 refills | Status: DC
Start: 2023-02-25 — End: 2023-02-25

## 2023-02-25 MED ORDER — SODIUM CHLORIDE 0.9 % IV SOLP
INTRAVENOUS | 0 refills | Status: DC
Start: 2023-02-25 — End: 2023-02-26
  Administered 2023-02-25: 18:00:00 1000.0000 mL via INTRAVENOUS

## 2023-02-25 MED ORDER — FENTANYL CITRATE (PF) 50 MCG/ML IJ SOLN
25 ug | INTRAVENOUS | 0 refills | Status: DC | PRN
Start: 2023-02-25 — End: 2023-02-25
  Administered 2023-02-25 (×7): 25 ug via INTRAVENOUS

## 2023-02-25 MED ORDER — NALOXONE 0.4 MG/ML IJ SOLN
.08 mg | INTRAVENOUS | 0 refills | Status: DC | PRN
Start: 2023-02-25 — End: 2023-03-08

## 2023-02-25 MED ORDER — HYDROMORPHONE (PF) 2 MG/ML IJ SYRG
.5 mg | INTRAVENOUS | 0 refills | Status: DC | PRN
Start: 2023-02-25 — End: 2023-02-25
  Administered 2023-02-25 (×4): 0.5 mg via INTRAVENOUS

## 2023-02-25 MED ORDER — INSULIN ASPART 100 UNIT/ML SC FLEXPEN
0-12 [IU] | Freq: Before meals | SUBCUTANEOUS | 0 refills | Status: DC
Start: 2023-02-25 — End: 2023-03-08
  Administered 2023-03-04: 14:00:00 4 [IU] via SUBCUTANEOUS

## 2023-02-25 MED ORDER — ROPIVACAINE (PF) 2 MG/ML (0.2 %) IJ SOLN
Freq: Once | 0 refills | Status: DC | PRN
Start: 2023-02-25 — End: 2023-02-25

## 2023-02-25 MED ORDER — ONDANSETRON HCL (PF) 4 MG/2 ML IJ SOLN
4 mg | Freq: Once | INTRAVENOUS | 0 refills | Status: CP | PRN
Start: 2023-02-25 — End: ?
  Administered 2023-02-25: 21:00:00 4 mg via INTRAVENOUS

## 2023-02-25 MED ORDER — SUCCINYLCHOLINE CHLORIDE 20 MG/ML IJ SOLN
INTRAVENOUS | 0 refills | Status: DC
Start: 2023-02-25 — End: 2023-02-25

## 2023-02-25 MED ORDER — DEXMEDETOMIDINE IN 0.9 % NACL 20 MCG/5 ML (4 MCG/ML) IV SYRG
Freq: Once | INTRAVENOUS | 0 refills | Status: DC | PRN
Start: 2023-02-25 — End: 2023-02-25

## 2023-02-25 MED ORDER — CEFOXITIN 2 GRAM IV SOLR
INTRAVENOUS | 0 refills | Status: DC
Start: 2023-02-25 — End: 2023-02-25

## 2023-02-25 MED ORDER — HYDROMORPHONE (PF) 2 MG/ML IJ SYRG
INTRAVENOUS | 0 refills | Status: DC
Start: 2023-02-25 — End: 2023-02-25

## 2023-02-25 MED ORDER — OXYCODONE 5 MG PO TAB
5 mg | Freq: Once | ORAL | 0 refills | Status: DC | PRN
Start: 2023-02-25 — End: 2023-02-25

## 2023-02-25 MED ORDER — ARTIFICIAL TEARS (PF) SINGLE DOSE DROPS GROUP
OPHTHALMIC | 0 refills | Status: DC
Start: 2023-02-25 — End: 2023-02-25

## 2023-02-25 MED ORDER — ONDANSETRON HCL (PF) 4 MG/2 ML IJ SOLN
INTRAVENOUS | 0 refills | Status: DC
Start: 2023-02-25 — End: 2023-02-25

## 2023-02-25 MED ORDER — DEXTROSE 50 % IN WATER (D50W) IV SYRG
12.5-25 g | INTRAVENOUS | 0 refills | Status: DC | PRN
Start: 2023-02-25 — End: 2023-03-08
  Administered 2023-02-25 – 2023-03-03 (×6): 25 mL via INTRAVENOUS
  Administered 2023-03-03: 23:00:00 50 mL via INTRAVENOUS

## 2023-02-25 MED ORDER — FENTANYL CITRATE (PF) 50 MCG/ML IJ SOLN
INTRAVENOUS | 0 refills | Status: DC
Start: 2023-02-25 — End: 2023-02-25

## 2023-02-25 MED ORDER — FENTANYL PCA 500 MCG/50 ML SYR (STD CONC)(ADULT)(PREMADE)
INTRAVENOUS | 0 refills | Status: DC
Start: 2023-02-25 — End: 2023-02-27
  Administered 2023-02-25 – 2023-02-27 (×4): 50.0000 mL via INTRAVENOUS

## 2023-02-25 MED ORDER — DEXAMETHASONE SODIUM PHOSPHATE 10 MG/ML IJ SOLN
Freq: Once | 0 refills | Status: DC | PRN
Start: 2023-02-25 — End: 2023-02-25

## 2023-02-25 MED ORDER — PROPOFOL INJ 10 MG/ML IV VIAL
INTRAVENOUS | 0 refills | Status: DC
Start: 2023-02-25 — End: 2023-02-25

## 2023-02-25 MED ORDER — NALOXONE 0.4 MG/ML IJ SOLN
.04 mg | INTRAVENOUS | 0 refills | Status: DC | PRN
Start: 2023-02-25 — End: 2023-02-25

## 2023-02-25 MED ORDER — PHENYLEPHRINE HCL IN 0.9% NACL 1 MG/10 ML (100 MCG/ML) IV SYRG
INTRAVENOUS | 0 refills | Status: DC
Start: 2023-02-25 — End: 2023-02-25

## 2023-02-25 MED ORDER — HYDROMORPHONE (PF) 2 MG/ML IJ SYRG
.5 mg | INTRAVENOUS | 0 refills | Status: DC | PRN
Start: 2023-02-25 — End: 2023-02-25

## 2023-02-25 MED ORDER — ACETAMINOPHEN 1,000 MG/100 ML (10 MG/ML) IV SOLN
1000 mg | Freq: Once | INTRAVENOUS | 0 refills | Status: CP
Start: 2023-02-25 — End: ?
  Administered 2023-02-25: 06:00:00 1000 mg via INTRAVENOUS

## 2023-02-25 MED ORDER — LIDOCAINE (PF) 200 MG/10 ML (2 %) IJ SYRG
INTRAVENOUS | 0 refills | Status: DC
Start: 2023-02-25 — End: 2023-02-25

## 2023-02-25 MED ORDER — DEXTROSE 50 % IN WATER (D50W) IV SYRG
25 mL | Freq: Once | INTRAVENOUS | 0 refills | Status: CP
Start: 2023-02-25 — End: ?
  Administered 2023-02-25: 17:00:00 25 mL via INTRAVENOUS

## 2023-02-25 MED ORDER — LIDOCAINE (PF) 10 MG/ML (1 %) IJ SOLN
Freq: Once | SUBCUTANEOUS | 0 refills | Status: DC | PRN
Start: 2023-02-25 — End: 2023-02-25

## 2023-02-25 MED ORDER — HALOPERIDOL LACTATE 5 MG/ML IJ SOLN
1 mg | Freq: Once | INTRAVENOUS | 0 refills | Status: CP | PRN
Start: 2023-02-25 — End: ?
  Administered 2023-02-25: 20:00:00 1 mg via INTRAVENOUS

## 2023-02-25 NOTE — Anesthesia Procedure Notes
Anesthesia Procedure: Peripheral Nerve Block    PERIPHERAL NERVE BLOCK    Date/Time: 02/25/2023 5:40 PM    Patient location: post-op  Reason for block: at surgeon's request and post-op pain management    Preprocedure checklist performed: 2 patient identifiers, risks & benefits discussed, patient evaluated, timeout performed, consent obtained, patient being monitored and sterile drape    Sterile technique:  - Proper hand washing  - Cap, mask  - Sterile gloves  - Skin prep for antisepsis        Peripheral Nerve Block Procedure   Patient position: supine  Prep: ChloraPrep    Monitoring: BP, EKG and continuous pulse ox  Block type: TAPs  Laterality: bilateral  Injection technique: single-shot  Procedures: ultrasound guided    Ultrasound image captured    Needle/cathether:      Needle type: Stimuplex      Needle gauge: 21 G; Needle length: 4 in     Needle location: anatomical landmarks and ultrasound guidance    Procedure Medications  Sedation: dexmedeTOMIDine (PRECEDEX) 20 mcg/5 mL injection - Intravenous   4 mcg - 02/25/2023 5:40:00 PM  Local Anesthesia: lidocaine PF 1% (10 mg/mL) injection - Subcutaneous   5 mL - 02/25/2023 5:40:00 PM   5 mL - 02/25/2023 5:30:00 PM  Bolus Dose: ropivacaine (PF) 0.2% (NAROPIN) injection - Block   20 mL - 02/25/2023 5:40:00 PM   20 mL - 02/25/2023 5:30:00 PM  Adjuvant Medications: dexAMETHasone (DECADRON) 10 mg/mL injection - Block   2 mg - 02/25/2023 5:40:00 PM   2 mg - 02/25/2023 5:30:00 PM    Procedure Outcome   Injection assessment: negative aspiration for heme, no paresthesia on injection, incremental injection and local visualized surrounding nerve on ultrasound  Observations: adequate block, patient sedated but conversant throughout block, patient tolerated the procedure well with no immediate complications and comfortable throughout block      Refer to nursing documentation for vitals and monitoring data during procedure.    Performed by: Pincus Badder, MD  Authorized by: Lewanda Rife, MD

## 2023-02-25 NOTE — Progress Notes
 Daily Progress Note      Today's Date:  02/25/2023  Name:  Amy Bowman                       MRN:  [id number]   Admission Date: 02/24/2023 (LOS: 1 day)                     Assessment: Amy Bowman is an 82 year old female with PMH of COPD, chronic respiratory failure (baseline O2 3 lpm), previous tobacco abuse, CAD s/p CABG, HTN, HLD, HFpEF, DM2 recent ventral hernia with secondary small bowel obstruction s/p ex lap with ventral hernia repair (4/24) now with recurrent SBO.       Plan:  - OR today   - NPO  - NGT decompression  - MMPC  - mIVF  - PTA meds as appropriate  - DVT ppx     Discussed with Dr. Arlene Ben  _____________________________________________________________________________    Subjective:  No acute events overnight. Pain well controlled.     Objective:  BP: (115-133)/(55-70)   Temp:  [36.5 ?C (97.7 ?F)-36.9 ?C (98.5 ?F)]   Pulse:  [93-135]   Respirations:  [17 PER MINUTE-18 PER MINUTE]   SpO2:  [93 %-100 %]   O2 Device: Nasal cannula  O2 Liter Flow: 3 Lpm  Body mass index is 42.88 kg/m?Amy Bowman    Lab Results   Component Value Date/Time    HGB 12.9 02/24/2023 03:09 PM    HCT 40.4 02/24/2023 03:09 PM    WBC 16.3 (H) 02/24/2023 03:09 PM    PLTCT 443 (H) 02/24/2023 03:09 PM    INR 1.2 01/28/2023 04:49 PM     Lab Results   Component Value Date/Time    NA 138 02/24/2023 03:09 PM    K 3.9 02/24/2023 03:09 PM    CL 101 02/24/2023 03:09 PM    CO2 22 02/24/2023 03:09 PM    BUN 33 (H) 02/24/2023 03:09 PM    CR 1.15 (H) 02/24/2023 03:09 PM    MG 2.2 02/24/2023 10:06 PM    PO4 4.6 (H) 02/24/2023 03:09 PM    CA 7.6 (L) 02/24/2023 03:09 PM     Lab Results   Component Value Date/Time    GLUPOC 158 (H) 01/30/2023 11:25 AM    GLUPOC 155 (H) 01/30/2023 08:40 AM    GLUPOC 79 01/29/2023 10:21 PM          Physical Exam  Constitutional:       Appearance: Normal appearance.   HENT:      Head: Normocephalic and atraumatic.      Nose: Nose normal.   Pulmonary:      Effort: Pulmonary effort is normal.   Abdominal:      General: There is mild distension.      Palpations: Abdomen is soft.      Tenderness: There is abdominal tenderness.      Comments: Well healed surgical scars   Musculoskeletal:         General: Normal range of motion.      Cervical back: Normal range of motion.   Skin:     General: Skin is warm and dry.   Neurological:      General: No focal deficit present.      Mental Status: She is alert and oriented to person, place, and time.   Psychiatric:         Mood and Affect: Mood normal.  Behavior: Behavior normal.                                           Active Wounds          Wounds Moisture associated skin damage Anterior;Left;Proximal Thigh (Active)   02/24/23 1400   Wound Type: Moisture associated skin damage   Orientation: Anterior;Left;Proximal   Location: Thigh   Wound Location Comments:    Initial Wound Site Closure:    Initial Dressing Placed:    Initial Cycle:    Initial Suction Setting (mmHg):    Pressure Injury Stages:    Pressure Injury Present Within 24 Hours of Hospital Admission:    If This Pressure Injury Is Suspected to Be Device Related, Please Select the Device::    Is the Wound Open or Closed:    Wound Assessment Moist;Pink;Red 02/24/23 2145   Peri-wound Assessment Dry;Intact;Pink 02/24/23 2145   Wound Drainage Amount None 02/24/23 2145   Wound Dressing Status None/open to air 02/24/23 2145   Number of days: 1                                              ___________________________    Amy Oman, MD

## 2023-02-25 NOTE — Anesthesia Procedure Notes
Procedure: Airway Placement    AIRWAY INSERTION    Date/Time: 02/25/2023 12:43 PM    Patient location: OR  Urgency: elective  Difficult Airway: No            Airway Procedure  Indication(s) for airway management: surgery        Rapid Sequence Induction (RSI) used: yes    Preoxygenated: yes  Patient position: sniffing  Neck stabilization: no in-line stabilization    Mask difficulty assessment: 0 - not attempted      Procedure Outcome  Final airway type: endotracheal airway  Endotracheal airway: ETT          ETT size (mm): 7.0  Technique used for successful ETT placement: direct laryngoscopy  Devices/methods used in placement: intubating stylet  Insertion site: oral  Blade type: Macintosh   Laryngoscope/Videolaryngoscope blade size: 3  Cormack-Lehane classification: grade IIa - partial view of glottis      Measured from: lips   Depth: 21 cm  Amount of Air in Cuff: 10 ml  Number of attempts at approach: 1  Placement verified by auscultation and capnometry            Complications  Cardiovascular:   Pulmonary:   Procedure: airway not difficult  Medication:         Performed by: Vassie Loll I, CRNA  Authorized by: Tanna Furry, MD

## 2023-02-25 NOTE — Progress Notes
 Multiple attempts at thoracic epidural made by resident without success. I took over the procedure and attempted a left paramedian approach at a T8-T9 level. Unfortunately, this attempt resulted in an inadvertent puncture of the dura. Kristeen Peto was quickly removed. Another attempt made at a level higher on the left side. The epidural space was encountered with the loss of resistance syringe. At this point, I threaded the epidural catheter. Unfortunately, patient had significant paraesthesias that did not resolve. Epidural catheter removed. The space was dilated more with normal saline and the epidural catheter again reintroduced. Patient again experienced significant paresthesias. At this point, the epidural attempt was aborted and patient was returned to a supine position.

## 2023-02-26 ENCOUNTER — Inpatient Hospital Stay: Admit: 2023-02-26 | Discharge: 2023-02-26 | Payer: MEDICARE

## 2023-02-26 LAB — BASIC METABOLIC PANEL
ANION GAP: 10 M/UL — ABNORMAL LOW (ref 3–12)
ANION GAP: 8 (ref 3–12)
BLD UREA NITROGEN: 24 mg/dL (ref 7–25)
BLD UREA NITROGEN: 25 mg/dL (ref 7–25)
CALCIUM: 7.3 mg/dL — ABNORMAL LOW (ref 8.5–10.6)
CHLORIDE: 108 MMOL/L — ABNORMAL LOW (ref 98–110)
CHLORIDE: 106 MMOL/L (ref 98–110)
CO2: 22 MMOL/L — ABNORMAL HIGH (ref 21–30)
CO2: 25 MMOL/L (ref 21–30)
CREATININE: 1.1 mg/dL — ABNORMAL HIGH (ref 0.4–1.00)
EGFR: 49 mL/min — ABNORMAL LOW (ref >60)
GLUCOSE,PANEL: 118 mg/dL — ABNORMAL HIGH (ref 70–100)
GLUCOSE,PANEL: 93 mg/dL (ref 70–100)
POTASSIUM: 3.7 MMOL/L (ref 3.5–5.1)
POTASSIUM: 3.8 MMOL/L (ref 3.5–5.1)
SODIUM: 140 MMOL/L (ref 137–147)
SODIUM: 139 MMOL/L (ref 137–147)

## 2023-02-26 LAB — POC GLUCOSE
POC GLUCOSE: 118 mg/dL — ABNORMAL HIGH (ref 70–100)
POC GLUCOSE: 117 mg/dL — ABNORMAL HIGH (ref 70–100)
POC GLUCOSE: 92 mg/dL (ref 70–100)

## 2023-02-26 MED ORDER — (INV) ENOXAPARIN 40MG SC SYRINGE
1 mg/kg | Freq: Two times a day (BID) | SUBCUTANEOUS | 0 refills | Status: DC
Start: 2023-02-26 — End: 2023-02-26

## 2023-02-26 MED ORDER — PHENOL 1.4 % MM SPRA
2 | OROMUCOSAL | 0 refills | Status: DC | PRN
Start: 2023-02-26 — End: 2023-02-28

## 2023-02-26 MED ORDER — ENOXAPARIN 40 MG/0.4 ML SC SYRG
40 mg | Freq: Two times a day (BID) | SUBCUTANEOUS | 0 refills | Status: DC
Start: 2023-02-26 — End: 2023-03-08
  Administered 2023-02-26 – 2023-03-08 (×21): 40 mg via SUBCUTANEOUS

## 2023-02-26 MED ORDER — LACTATED RINGERS IV SOLP
500 mL | INTRAVENOUS | 0 refills | Status: CP
Start: 2023-02-26 — End: ?
  Administered 2023-02-26: 11:00:00 500 mL via INTRAVENOUS

## 2023-02-26 MED ORDER — METHOCARBAMOL 100 MG/ML IJ SOLN
500 mg | INTRAVENOUS | 0 refills | Status: DC
Start: 2023-02-26 — End: 2023-02-27
  Administered 2023-02-26 – 2023-02-27 (×5): 500 mg via INTRAVENOUS

## 2023-02-26 MED ORDER — ACETAMINOPHEN 1,000 MG/100 ML (10 MG/ML) IV SOLN
1000 mg | INTRAVENOUS | 0 refills | Status: DC
Start: 2023-02-26 — End: 2023-02-27
  Administered 2023-02-26 – 2023-02-27 (×5): 1000 mg via INTRAVENOUS

## 2023-02-26 MED ORDER — LACTATED RINGERS IV SOLP
INTRAVENOUS | 0 refills | Status: DC
Start: 2023-02-26 — End: 2023-03-01
  Administered 2023-02-26 – 2023-03-01 (×7): 1000.0000 mL via INTRAVENOUS

## 2023-02-26 NOTE — Progress Notes
 PHYSICAL THERAPY  NOTE      Name: Amy Bowman   MRN: [id number]     DOB: Sep 29, 1940      Age: 82 y.o.  Admission Date: 02/24/2023     LOS: 2 days     Date of Service: 02/26/2023      Based on discussion with OT, patient's current level of function suggests that patient will progress with one discipline. PT will sign off at this time. Please re-consult if a change in functional status occurs.    Therapist: Fara Hone, PT, DPT  Date: 02/26/2023

## 2023-02-26 NOTE — Case Management (ED)
 Case Management 30 Day Readmission Assessment      NAME: Amy Bowman                    MRN: [id number]              DOB: 10-07-1940          AGE: 82 y.o.  ADMISSION DATE: 02/24/2023             DAYS ADMITTED: LOS: 2 days    Today's Date: 02/26/2023    Source of Information: Spouse Coni Homesley and EMR    Expected Discharge Date:  02/28/2023 12:00 PM    Plan:  Anticipate DC back to prior living situation. Transport to be provided by family.    Previous Inpatient Discharge Date:    01/30/23    Previous Case Management admission assessment dated 01/29/2023 is copied at the end of this note for reference and was reviewed with patient/family.  Changes to information from previous admission assessment are: none.     Acute Hospital Stay:  Acute Hospital Stay: Yes  Was patient's stay within the last 30 days?: Yes  Name of Hospital: [facility]  When did patient receive care?: 01/29/2023    Prior Living Situation/Admitted from?        Post-Acute Services/DME Last Discharge:  Were post-acute services/DME arranged at previous disharge?: No    Lack of services:  At or after previous discharge, what did the patient/family feel they lacked?: Not applicable    Previous Discharge Resources Provided:  Were resources provided to patient at previous discharge in person or within AVS?: No    Social Determinants of Health (SDOH):  Within the past 12 months we worried whether our food would run out before we got money to buy more.: Never True  Within the past 12 months the food we bought just didn't last and we didn't have money to get more.: Never True  In the last 12 months, have you needed to see a doctor, but could not because of cost?: No  In the past 12 months, have you ever gone without  health care because you didn't have a way to get there?: No  In the last 12 months, did you skip medications to save money?: No  In the last 12 months, has your utility company shut off your service for not paying your bills?: No  Are you worried that in the next 2 months, you may not have stable housing?: No    Transportation:  Does the Patient Need Case Management to Arrange Discharge Transport? (ex: facility, ambulance, wheelchair/stretcher, Medicaid, cab, other): No  Will the Patient Use Family Transport?: Yes  Transportation Name, Phone and Availability #1: [place], [place], [phone]    Jamair Cato, LMSW    Tena Feeling. LMSW  Available on [facility]  Desk Phone [phone]      Gioia Laity, RN  Case Mgmt RN  Case Management     Case Mgmt DC Plan     Signed     Creation Time: 01/29/23 1208       Case Management Admission Assessment     NAME:Amy Bowman                          MRN: [id number]             DOB:1940/11/18          AGE: 82 y.o.  ADMISSION DATE: 01/28/2023  DAYS ADMITTED: LOS: 1 day      Today?s Date: 01/29/2023     Source of Information: patient; EMR        Plan  Plan: Case Management Assessment, Assist PRN with SW/NCM Services     This CM met with pt for assessment on this date.  Provided contact information and explanation of SW/NCM roles.  Reviewed [facility], Preparing for Discharge, and [facility] hand-outs.  Provided opportunity for questions and discussion. Pt/family encouraged to contact Case Management team with questions and concerns during hospitalization and until patient is able to transition back to the patient's primary care physician.     *NCM visited with patient at bedside.  *Role of NCM explained and business card provided.  *Pt states she's independent with self-care & ADL's PTA & has no concerns for discharge.  *DC Transport:  Pts spouse, Drexel Gentles  *DME:  RW, SPC, WC, scooter, shower chair  *HH / HI:  [facility], would return  *SNF / IPR / LTACH:  None  *No NCM needs identified at this time. NCM encouraged pt to reach out if any needs should change. Pt verbalized understanding.  *NCM will continue to follow and assess for additional needs through discharge.        Patient Address/Phone  [address]  [place] [address]  [phone] (home)      Emergency Contact  Extended Emergency Contact Information  Primary Emergency Contact: Bisaillon,Joseph  Address: [address]           [place], [place] [address] [place]   Home Phone: [phone]  Mobile Phone: [phone]  Relation: Spouse     Healthcare Directive  Healthcare Directive: Yes, patient has a healthcare directive  Type of Healthcare Directive: Durable power of attorney for healthcare, Living Will  Location of Healthcare Directive: Patient does not have it with him/her  Would patient like to fill out a (a new) Editor, commissioning?: N/A        Transportation  Does the Patient Need Case Management to Arrange Discharge Transport? (ex: facility, ambulance, wheelchair/stretcher, Medicaid, cab, other): No  Will the Patient Use Family Transport?: Yes  Transportation Name, Phone and Availability #1:  Drexel Gentles)     Expected Discharge Date        Living Situation Prior to Admission  Living Arrangements  Type of Residence: Home, independent  Living Arrangements: Spouse/significant other Drexel Gentles)  Financial risk analyst / Tub: Psychologist, counselling  How many levels in the residence?: 1  Can patient live on one level if needed?: Yes  Does residence have entry and/or inside stairs?: No  Assistance needed prior to admit or anticipated on discharge: No  Who provides assistance or could if needed?:  Drexel Gentles)  Are they in good health?: Yes  Can support system provide 24/7 care if needed?: Maybe  Level of Function   Prior level of function: Independent  Cognitive Abilities   Cognitive Abilities: Alert and Oriented, Participates in decision making, Engages in problem solving and planning, Recognizes impact of health condition on lifestyle     Financial Resources  Coverage  Primary Insurance: Medicare  Secondary Insurance: Nurse, learning disability  Medication Coverage    Medication Coverage: Commercial insurance  Have you experienced a noticeable increase in your copay costs recently?: No  Are current medications affordable?: Yes  Do You Use a Co-Pay Card or a Medication Assistance Program to Help Manage Medication Costs?: Yes (Comment)  Do You Manage Your Own Medications?: Yes  Source of Income  Source Of Income: SSI, Other retirement income  Financial Assistance Needed?  None identified at this time     Psychosocial Needs  Mental Health  Mental Health History: No  Substance Use History  Substance Use History Screen: No  Other  None identified at this time      Current/Previous Services  PCP  Alfrieda Infante, [phone], [phone]  Pharmacy     [facility]  No address on file  Phone: [phone] Fax: [phone]     [id number] [place] [place] - [address]  [address] [place]  [place] [address]  Phone: [phone] Fax: [phone]     [facility] [address]  [address]  [address]  [place] [address]  Phone: [phone] Fax: [phone]     Durable Medical Equipment   Durable Medical Equipment at home: [employer], Shower Chair, Nurse, adult, Biomedical scientist (manual)  Home Health  Receiving home health: In the past  Agency name:  ([facility])  Would patient use this agency again?: Yes  Hemodialysis or Peritoneal Dialysis  Undergoing hemodialysis or peritoneal dialysis: No  Tube/Enteral Feeds  Receive tube/enteral feeds: No  Infusion  Receive infusions: No  Private Duty  Private duty help used: No  Home and Community Based Services  Home and community based services: No  Ryan White  Ryan White: N/A  Hospice  Hospice: No  Outpatient Therapy  PT: No  OT: No  SLP: No  Skilled Nursing Facility/Nursing Home  SNF: No  NH: No  Inpatient Rehab  IPR: No  Long-Term Acute Care Hospital  LTACH: No  Acute Hospital Stay  Acute Hospital Stay: In the past  Was patient's stay within the last 30 days?: No        Gioia Laity RN, BSN, NCM  Integrated Nurse Case Manager  Phone: [phone]  Pager: [phone]         Electronically signed by Gioia Laity, RN at 01/29/23 1210         Admission (Discharged) on 01/28/2023            Detailed Report          Note shared with patient

## 2023-02-26 NOTE — Procedures
 Vascular Access Team consulted to obtain lab specimen. VAT noted a new PIV from 7/7 and asked the bedside RN if she tried to draw from PIV? Patient stated they obtained blood from IV this AM. Bedside RN  attempted and was successful at drawing the lab through the PIV. VAT assisted. Consult canceled.[name]

## 2023-02-26 NOTE — Procedures
 Vascular Access Team consulted to obtain lab specimen.    Ultrasound Used: No    How Many Attempts: 1    At 0720 approximately 5 ml of blood obtained from R FA 20g PIV. Patient tolerated the procedure well. Specimen labeled and sent to lab

## 2023-02-26 NOTE — Anesthesia Post-Procedure Evaluation
Post-Anesthesia Evaluation    Name: Amy Bowman      MRN: [id number]     DOB: 04-25-1941     Age: 82 y.o.     Sex: female   __________________________________________________________________________     Procedure Information       Anesthesia Start Date/Time: 02/25/23 1230    Procedures:       ENTEROLYSIS (Abdomen) - 22 modifier - greater than 60 mins      ENTERECTOMY RESECTION AND ANASTOMOSIS      SUTURE SMALL INTESTINE FOR PERFORATED ULCER/ DIVERTICULUM/ WOUND/ INJURY/ RUPTURE - SINGLE PERFORATION    Location: MAIN OR 03 / Main OR/Periop    Surgeons: Valerie Salts, MD            Post-Anesthesia Vitals  BP: 130/69 (07/07 1856)  Temp: 36.3 ?C (97.3 ?F) (07/07 1856)  Respirations: 18 PER MINUTE (07/07 1856)  SpO2: 95 % (07/07 1856)  O2 Device: Nasal cannula (07/07 1856)   Vitals Value Taken Time   BP 98/52 02/25/23 1815   Temp 36.9 ?C (98.5 ?F) 02/25/23 1815   Pulse 101 02/25/23 1815   Respirations 21 PER MINUTE 02/25/23 1815   SpO2 96 % 02/25/23 1815   O2 Device Nasal cannula 02/25/23 1815   ABP     ART BP           Post Anesthesia Evaluation Note    Evaluation location: Pre/Post  Patient participation: recovered; patient participated in evaluation  Level of consciousness: alert    Pain score: 4  Pain management: adequate    Hydration: normovolemia  Temperature: 36.0?C - 38.4?C  Airway patency: adequate    Regional/Neuraxial:       Neurological status: sensory deficit      Single injection shot performed    Perioperative Events       Post-op nausea and vomiting: no PONV    Postoperative Status  Cardiovascular status: hemodynamically stable  Respiratory status: spontaneous ventilation  Additional comments: Patient with significant pain after surgery. Attempted to perform an epidural around T8 with significant difficulty ending with a wet tap. After IV pain medications and some rest time, performed b/l TAPs on patient with great relief of pain afterwards. Patient appropriate for discharge from PACU.        Perioperative Events  There were no known complications for this encounter.

## 2023-02-26 NOTE — Care Plan
Problem: Discharge Planning  Goal: Participation in plan of care  Outcome: Goal Ongoing  Goal: Knowledge regarding plan of care  Outcome: Goal Ongoing  Goal: Prepared for discharge  Outcome: Goal Ongoing     Problem: Harm to self, low risk of suicide  Goal: Absence of harm to self, low risk of suicide  Outcome: Goal Ongoing

## 2023-02-26 NOTE — Progress Notes
 Daily Progress Note      Today's Date:  02/26/2023  Name:  Amy Bowman                       MRN:  [id number]   Admission Date: 02/24/2023 (LOS: 2 days)                     Assessment: Amy Bowman is an 82 year old female with PMH of COPD, chronic respiratory failure (baseline O2 3 lpm), previous tobacco abuse, CAD s/p CABG, HTN, HLD, HFpEF, DM2 recent ventral hernia with secondary small bowel obstruction s/p ex lap with ventral hernia repair (4/24) now with recurrent SBO s/p ex-lap, SB resection, repair of serosal tear, and LOA (7/7).      Plan:  - NPO  - NGT decompression-575 yesterday  - d/c foley cath today  - MMPC  - mIVF  - PTA meds as appropriate  - DVT ppx     Discussed with Dr. Mariann Shires  _____________________________________________________________________________    Subjective:  No acute events overnight. Pain well controlled. Tachy ON bolused 500 cc of LR.    Objective:  BP: (72-167)/(50-129)   Temp:  [36.3 ?C (97.3 ?F)-37 ?C (98.6 ?F)]   Pulse:  [89-125]   Respirations:  [10 PER MINUTE-29 PER MINUTE]   SpO2:  [93 %-100 %]   O2 Device: Nasal cannula  O2 Liter Flow: 3 Lpm  Body mass index is 42.88 kg/m?[name]    Lab Results   Component Value Date/Time    HGB 11.6 (L) 02/26/2023 07:19 AM    HCT 36.0 02/26/2023 07:19 AM    WBC 11.7 (H) 02/26/2023 07:19 AM    PLTCT 341 02/26/2023 07:19 AM    INR 1.2 01/28/2023 04:49 PM     Lab Results   Component Value Date/Time    NA 140 02/26/2023 07:19 AM    K 3.7 02/26/2023 07:19 AM    CL 108 02/26/2023 07:19 AM    CO2 22 02/26/2023 07:19 AM    BUN 24 02/26/2023 07:19 AM    CR 1.16 (H) 02/26/2023 07:19 AM    MG 1.7 02/26/2023 07:19 AM    PO4 3.5 02/26/2023 07:19 AM    CA 7.3 (L) 02/26/2023 07:19 AM     Lab Results   Component Value Date/Time    GLUPOC 116 (H) 02/26/2023 08:53 AM    GLUPOC 118 (H) 02/25/2023 09:32 PM    GLUPOC 116 (H) 02/25/2023 02:55 PM          Physical Exam  Constitutional:       Appearance: Normal appearance.   HENT:      Head: Normocephalic and atraumatic. Nose: Nose normal.   Pulmonary:      Effort: Pulmonary effort is normal.   Abdominal:      General: There is mild distention      Palpations: Abdomen is soft.      Tenderness: appropriate TTP     Comments: Well healed surgical scars   Musculoskeletal:         General: Normal range of motion.      Cervical back: Normal range of motion.   Skin:     General: Skin is warm and dry.   Neurological:      General: No focal deficit present.      Mental Status: She is alert and oriented to person, place, and time.   Psychiatric:  Mood and Affect: Mood normal.         Behavior: Behavior normal.                                           Active Wounds          Wounds Moisture associated skin damage Anterior;Left;Proximal Thigh (Active)   02/24/23 1400   Wound Type: Moisture associated skin damage   Orientation: Anterior;Left;Proximal   Location: Thigh   Wound Location Comments:    Initial Wound Site Closure:    Initial Dressing Placed:    Initial Cycle:    Initial Suction Setting (mmHg):    Pressure Injury Stages:    Pressure Injury Present Within 24 Hours of Hospital Admission:    If This Pressure Injury Is Suspected to Be Device Related, Please Select the Device::    Is the Wound Open or Closed:    Wound Assessment Moist;Pink 02/26/23 0845   Peri-wound Assessment Intact 02/26/23 0845   Wound Drainage Amount None 02/26/23 0845   Wound Dressing Status None/open to air 02/26/23 0845   Number of days: 2       Wounds Surgical incision Umbilicus (Active)   02/25/23 1318   Wound Type: Surgical incision   Orientation:    Location: Umbilicus   Wound Location Comments:    Initial Wound Site Closure: Staples   Initial Dressing Placed: Gauze;Hypafix tape   Initial Cycle:    Initial Suction Setting (mmHg):    Pressure Injury Stages:    Pressure Injury Present Within 24 Hours of Hospital Admission:    If This Pressure Injury Is Suspected to Be Device Related, Please Select the Device::    Is the Wound Open or Closed:    Wound Assessment Dressing not removed for assessment 02/26/23 0845   Peri-wound Assessment Intact 02/26/23 0845   Wound Drainage Amount Small 02/26/23 0845   Wound Drainage Description Sanguineous 02/26/23 0845   Wound Dressing Status Intact 02/26/23 0845   Number of days: 1                                              ___________________________    Evon Hoguet, MD

## 2023-02-27 ENCOUNTER — Encounter: Admit: 2023-02-27 | Discharge: 2023-02-27 | Payer: MEDICARE

## 2023-02-27 ENCOUNTER — Inpatient Hospital Stay: Admit: 2023-02-27 | Discharge: 2023-02-27 | Payer: MEDICARE

## 2023-02-27 DIAGNOSIS — H547 Unspecified visual loss: Secondary | ICD-10-CM

## 2023-02-27 DIAGNOSIS — E669 Obesity, unspecified: Secondary | ICD-10-CM

## 2023-02-27 DIAGNOSIS — J45909 Unspecified asthma, uncomplicated: Secondary | ICD-10-CM

## 2023-02-27 DIAGNOSIS — M1990 Unspecified osteoarthritis, unspecified site: Secondary | ICD-10-CM

## 2023-02-27 DIAGNOSIS — Z79899 Other long term (current) drug therapy: Secondary | ICD-10-CM

## 2023-02-27 DIAGNOSIS — K439 Ventral hernia without obstruction or gangrene: Secondary | ICD-10-CM

## 2023-02-27 DIAGNOSIS — I10 Essential (primary) hypertension: Secondary | ICD-10-CM

## 2023-02-27 DIAGNOSIS — K56609 Unspecified intestinal obstruction, unspecified as to partial versus complete obstruction: Secondary | ICD-10-CM

## 2023-02-27 DIAGNOSIS — M549 Dorsalgia, unspecified: Secondary | ICD-10-CM

## 2023-02-27 DIAGNOSIS — E785 Hyperlipidemia, unspecified: Secondary | ICD-10-CM

## 2023-02-27 DIAGNOSIS — I2510 Atherosclerotic heart disease of native coronary artery without angina pectoris: Secondary | ICD-10-CM

## 2023-02-27 DIAGNOSIS — Z9889 Other specified postprocedural states: Secondary | ICD-10-CM

## 2023-02-27 DIAGNOSIS — R413 Other amnesia: Secondary | ICD-10-CM

## 2023-02-27 DIAGNOSIS — J439 Emphysema, unspecified: Secondary | ICD-10-CM

## 2023-02-27 DIAGNOSIS — E119 Type 2 diabetes mellitus without complications: Secondary | ICD-10-CM

## 2023-02-27 LAB — POC GLUCOSE
POC GLUCOSE: 70 mg/dL (ref 70–100)
POC GLUCOSE: 56 mg/dL — ABNORMAL LOW (ref 70–100)
POC GLUCOSE: 100 mg/dL (ref 70–100)
POC GLUCOSE: 79 mg/dL (ref 70–100)
POC GLUCOSE: 57 mg/dL — ABNORMAL LOW (ref 70–100)
POC GLUCOSE: 96 mg/dL (ref 70–100)

## 2023-02-27 LAB — PHOSPHORUS: PHOSPHORUS: 2.9 mg/dL — ABNORMAL LOW (ref 2.0–4.5)

## 2023-02-27 LAB — BASIC METABOLIC PANEL
ANION GAP: 9 % — ABNORMAL LOW (ref 3–12)
BLD UREA NITROGEN: 22 mg/dL (ref 7–25)
CHLORIDE: 108 MMOL/L — ABNORMAL LOW (ref 98–110)
CREATININE: 0.9 mg/dL (ref 0.4–1.00)
POTASSIUM: 3.3 MMOL/L — ABNORMAL LOW (ref 3.5–5.1)
SODIUM: 141 MMOL/L (ref 137–147)

## 2023-02-27 LAB — CBC
MPV: 7.2 FL — ABNORMAL HIGH (ref 7–11)
PLATELET COUNT: 306 10*3/uL — ABNORMAL LOW (ref >60)
RDW: 21 % — ABNORMAL HIGH (ref >60)

## 2023-02-27 LAB — MAGNESIUM: MAGNESIUM: 1.7 mg/dL — ABNORMAL LOW (ref 1.6–2.6)

## 2023-02-27 LAB — SURGICAL PATHOLOGY

## 2023-02-27 MED ORDER — PANTOPRAZOLE 40 MG IV SOLR
80 mg | Freq: Once | INTRAVENOUS | 0 refills | Status: CN
Start: 2023-02-27 — End: ?

## 2023-02-27 MED ORDER — METHOCARBAMOL 750 MG PO TAB
750 mg | Freq: Two times a day (BID) | ORAL | 0 refills | Status: DC
Start: 2023-02-27 — End: 2023-03-02
  Administered 2023-02-28 – 2023-03-02 (×6): 750 mg via ORAL

## 2023-02-27 MED ORDER — POTASSIUM CHLORIDE IN WATER 10 MEQ/50 ML IV PGBK
10 meq | INTRAVENOUS | 0 refills | Status: CP
Start: 2023-02-27 — End: ?
  Administered 2023-02-27: 18:00:00 10 meq via INTRAVENOUS

## 2023-02-27 MED ORDER — ACETAMINOPHEN 325 MG PO TAB
650 mg | ORAL | 0 refills | Status: DC
Start: 2023-02-27 — End: 2023-03-02
  Administered 2023-02-27 – 2023-03-02 (×10): 650 mg via ORAL

## 2023-02-27 MED ORDER — PANTOPRAZOLE 40 MG IV SOLR
40 mg | Freq: Two times a day (BID) | INTRAVENOUS | 0 refills | Status: CN
Start: 2023-02-27 — End: ?

## 2023-02-27 MED ORDER — OXYCODONE 5 MG PO TAB
5-10 mg | ORAL | 0 refills | Status: DC | PRN
Start: 2023-02-27 — End: 2023-03-01
  Administered 2023-02-28 (×4): 10 mg via ORAL

## 2023-02-27 MED ORDER — POTASSIUM, SODIUM PHOSPHATES 280-160-250 MG PO PWPK
2 | Freq: Once | ORAL | 0 refills | Status: CP
Start: 2023-02-27 — End: ?
  Administered 2023-02-27: 19:00:00 2 via ORAL

## 2023-02-27 MED ORDER — PANTOPRAZOLE 40 MG IV SOLR
40 mg | Freq: Two times a day (BID) | INTRAVENOUS | 0 refills | Status: DC
Start: 2023-02-27 — End: 2023-02-27
  Administered 2023-02-27: 19:00:00 40 mg via INTRAVENOUS

## 2023-02-27 MED ORDER — PANTOPRAZOLE 40 MG PO TBEC
40 mg | Freq: Two times a day (BID) | ORAL | 0 refills | Status: DC
Start: 2023-02-27 — End: 2023-03-02
  Administered 2023-02-28 – 2023-03-02 (×6): 40 mg via ORAL

## 2023-02-27 MED ORDER — POTASSIUM CHLORIDE IN WATER 10 MEQ/50 ML IV PGBK
10 meq | INTRAVENOUS | 0 refills | Status: CP
Start: 2023-02-27 — End: ?
  Administered 2023-02-27: 20:00:00 10 meq via INTRAVENOUS

## 2023-02-27 NOTE — Care Plan
 Problem: Discharge Planning  Goal: Participation in plan of care  Outcome: Goal Ongoing  Goal: Knowledge regarding plan of care  Outcome: Goal Ongoing  Goal: Prepared for discharge  Outcome: Goal Ongoing     Problem: Harm to self, low risk of suicide  Goal: Absence of harm to self, low risk of suicide  Outcome: Goal Ongoing     Problem: Moderate Fall Risk  Goal: Moderate Fall Risk  Outcome: Goal Ongoing

## 2023-02-27 NOTE — Procedures
 Vascular Access Team consulted to obtain lab specimen.    Ultrasound Used: Yes    How Many Attempts: 1    Location of Unsuccessful Attempts: NA    At 0713 approximately 7 ml of blood obtained from LA. Patient tolerated the procedure well. Specimen labeled and sent to lab

## 2023-02-27 NOTE — Progress Notes
 OCCUPATIONAL THERAPY  PROGRESS NOTE    Name: Amy Bowman   MRN: [id number]     DOB: 11-02-1940      Age: 82 y.o.  Admission Date: 02/24/2023     LOS: 3 days     Date of Service: 02/27/2023    Mobility  Patient Turn/Position: Self  Progressive Mobility Level: Walk in room  Distance Walked (feet): 30 ft  Level of Assistance: Assist X1  Assistive Device: Walker  Activity Limited By: Nunzio Belch    Subjective  Reason for Admission and Past Medical Hx: hx ventral hernia repair (4/24) now with recurrent SBO, now s/p ex-lap, SB resection, repair of serosal tear, and LOA (7/7)  Special Considerations: Home oxygen requirement (3L O2 baseline, NGT)  Mental / Cognitive: Alert;Follows commands  Pain: Complains of pain  Pain Location: Post-surgical;Abdomen  Pain Interventions: PCA/PCEA/PNC present;Patient agrees to participate in therapy with current pain level  Persons Present: Daughter    Home Living Situation  Lives With: Spouse/significant other  Type of Home: House  Entry Stairs: No stairs  In-Home Stairs: Able to live on main level  Bathroom Setup: Walk in shower (grab bars by toilet)  Patient Owned Equipment: Environmental consultant with wheels (and scooter)    Prior Level of Function  Level Of Independence: Independent with ADL and household mobility with device;Assist needed for IADL (husband assists with LE dressing as needed, pt typically wears gowns)  Comments: Pt primarily uses scooter for in-home mobility.  Takes scooter to doorway of bathroom, then able to use RW to ambulate short distance into bathroom.  Pt sleeps in recliner.  Baseline mobility limited by LLE pain, indicates she needs orthopedic surgery in the future.  Required Assist For: All home functioning ADL;In and out of house (in/out of car - husband assists)       ADL's  Where Assessed: Edge of Bed  Eating Deficits: NPO, but hand to mouth WNL  LE Dressing Assist: Maximum Assist  LE Dressing Deficits: Don/Doff R Sock;Don/Doff L Sock  Toileting Assist:  (pt recently transferred to/from commode prior to OT arrival)    ADL Mobility  Bed Mobility: Sit to Supine: Moderate assist  Bed Mobility Comments: Pt sleeps in recliner at home  Transfer Type: Sit to stand  Transfer: Assistance Level: To/from;Bed;Minimal assist  Transfer: Assistive Device: Roller walker  Transfer: Type of Assistance: Elevated bed  End of Activity Status: In bed;Nursing notified  Transfer Comments: Increased drainage noted from incision after back in bed, nursing notified.  Sitting Balance: Standby assist  Standing Balance: Minimal assist  Gait Distance: 30 feet (15' x2)  Gait: Assistance Level: Minimal assist;of 1st person;Standby assist;of 2nd person;Management of lines  Gait: Assistive Device: Roller walker  Gait Comments: Pt desires to walk to doorway and back, states per team recommendation to aid in return of bowel function.  After ambulating to doorway, pt leans against doorjam and commented that she should have had a chair set up for a rest break.  No chair immediately available, pt willing to ambulate back to bed but states she felt her LLE may give out.  OT discussed distance needed to ambulate at home, pt confirms that she navigates distance shorter than 15' when walking into bathroom and back.  Discussed recommendation with nursing staff to limit pt's short distance ambulation to commode and back, which would be similar to the distance she would need to navigate at home.         Cognition  Overall Cognitive Status: WFL to  Adequately Complete Self Care Tasks Safely  Cognition Comment: pt noted to be anxious throughout session, plan for mobility explained to pt prior to mobilizing       Assessment  Assessment: Decreased Endurance;Decreased Self-Care Trans  Prognosis: Good    AM-PAC 6 Clicks Daily Activity Inpatient  Putting on and taking off regular lower body clothes: A Lot  Bathing (Including washing, rinsing, drying): A Lot  Toileting, which includes using toilet, bedpan, or urinal: A Lot  Putting on and taking off regular upper body clothing: A Little  Taking care of personal grooming such as brushing teeth: None  Eating meals: None  Daily Activity Raw Score: 17  Standardized (T-scale) Score: 37.26    Plan  OT Frequency: 5x/week  OT Plan for Next Visit: progress independence with short distance ambulation       Functional Transfer Goals  Pt Will Perform All Functional Transfers: w/ Stand By Assist       OT Discharge Recommendations  Recommendation: Home/prior living situation  Patient Currently Requires Equipment: None;Owns what is needed      Therapist: Aleem Elza, OT  Date: 02/27/2023

## 2023-02-27 NOTE — Progress Notes
 Daily Progress Note      Today's Date:  02/27/2023  Name:  Amy Bowman                       MRN:  [id number]   Admission Date: 02/24/2023 (LOS: 3 days)                     Assessment: Amy Bowman is an 82 year old female with PMH of COPD, chronic respiratory failure (baseline O2 3 lpm), previous tobacco abuse, CAD s/p CABG, HTN, HLD, HFpEF, DM2 recent ventral hernia with secondary small bowel obstruction s/p ex lap with ventral hernia repair (4/24) now with recurrent SBO s/p ex-lap, SB resection, repair of serosal tear, and LOA (7/7).      Plan:  - NPO  - Continue NG decompression this AM for high output; will perform clamp trial this afternoon  - BID PPI  - MMPC  - mIVF  - PTA meds as appropriate  - DVT ppx     Discussed with Dr. Mariann Shires  _____________________________________________________________________________    Subjective:  No acute events overnight. Pain well controlled. Having several cups of ice chips per shift.     Objective:  BP: (110-128)/(55-101)   Temp:  [36.3 ?C (97.4 ?F)-37.2 ?C (98.9 ?F)]   Pulse:  [90-111]   Respirations:  [16 PER MINUTE-20 PER MINUTE]   SpO2:  [92 %-100 %]   O2 Device: Nasal cannula  O2 Liter Flow: 3 Lpm  Body mass index is 42.88 kg/m?[name]    Lab Results   Component Value Date/Time    HGB 10.1 (L) 02/27/2023 07:05 AM    HCT 30.5 (L) 02/27/2023 07:05 AM    WBC 10.1 02/27/2023 07:05 AM    PLTCT 306 02/27/2023 07:05 AM    INR 1.2 01/28/2023 04:49 PM     Lab Results   Component Value Date/Time    NA 141 02/27/2023 07:05 AM    K 3.3 (L) 02/27/2023 07:05 AM    CL 108 02/27/2023 07:05 AM    CO2 24 02/27/2023 07:05 AM    BUN 22 02/27/2023 07:05 AM    CR 0.95 02/27/2023 07:05 AM    MG 1.7 02/27/2023 07:05 AM    PO4 2.9 02/27/2023 07:05 AM    CA 7.8 (L) 02/27/2023 07:05 AM     Lab Results   Component Value Date/Time    GLUPOC 79 02/27/2023 01:47 PM    GLUPOC 97 02/27/2023 11:52 AM    GLUPOC 68 (L) 02/27/2023 11:15 AM          Physical Exam  Constitutional:       Appearance: Normal appearance. HENT:      Head: Normocephalic and atraumatic.      Nose: Nose normal.   Pulmonary:      Effort: Pulmonary effort is normal.   Abdominal:      General: There is mild distention      Palpations: Abdomen is soft.      Tenderness: appropriate TTP     Comments: Well healed surgical scars   Musculoskeletal:         General: Normal range of motion.      Cervical back: Normal range of motion.   Skin:     General: Skin is warm and dry.   Neurological:      General: No focal deficit present.      Mental Status: She is alert and oriented to person, place, and  time.   Psychiatric:         Mood and Affect: Mood normal.         Behavior: Behavior normal.                                           Active Wounds          Wounds Moisture associated skin damage Anterior;Left;Proximal Thigh (Active)   02/24/23 1400   Wound Type: Moisture associated skin damage   Orientation: Anterior;Left;Proximal   Location: Thigh   Wound Location Comments:    Initial Wound Site Closure:    Initial Dressing Placed:    Initial Cycle:    Initial Suction Setting (mmHg):    Pressure Injury Stages:    Pressure Injury Present Within 24 Hours of Hospital Admission:    If This Pressure Injury Is Suspected to Be Device Related, Please Select the Device::    Is the Wound Open or Closed:    Wound Assessment Moist;Pink 02/27/23 0900   Peri-wound Assessment Intact 02/27/23 0900   Wound Drainage Amount None 02/27/23 0900   Wound Dressing Status None/open to air 02/27/23 0900   Number of days: 3       Wounds Surgical incision Umbilicus (Active)   02/25/23 1318   Wound Type: Surgical incision   Orientation:    Location: Umbilicus   Wound Location Comments:    Initial Wound Site Closure: Staples   Initial Dressing Placed: Gauze;Hypafix tape   Initial Cycle:    Initial Suction Setting (mmHg):    Pressure Injury Stages:    Pressure Injury Present Within 24 Hours of Hospital Admission:    If This Pressure Injury Is Suspected to Be Device Related, Please Select the Device::    Is the Wound Open or Closed:    Wound Assessment Dry;Pink;Red 02/27/23 0900   Peri-wound Assessment Intact 02/27/23 0900   Wound Drainage Amount None 02/27/23 0900   Wound Drainage Description Sanguineous 02/26/23 0845   Wound Dressing Status Intact 02/27/23 0930   Wound Care Dressing changed or new application 02/27/23 0930   Wound Dressing and/or Treatment Abdominal pad;Hypafix tape 02/27/23 0930   Number of days: 2                                              ___________________________    Jolyne Needs, MD

## 2023-02-28 ENCOUNTER — Inpatient Hospital Stay: Admit: 2023-02-28 | Discharge: 2023-02-28 | Payer: MEDICARE

## 2023-02-28 LAB — BASIC METABOLIC PANEL
ANION GAP: 9 % — ABNORMAL HIGH (ref 3–12)
BLD UREA NITROGEN: 19 mg/dL — ABNORMAL HIGH (ref 7–25)
CALCIUM: 8.6 mg/dL (ref 8.5–10.6)
CREATININE: 0.7 mg/dL (ref 0.4–1.00)
EGFR: >60 mL/min (ref >60)
GLUCOSE,PANEL: 58 mg/dL — ABNORMAL LOW (ref >60)

## 2023-02-28 LAB — POC GLUCOSE
POC GLUCOSE: 56 mg/dL — ABNORMAL LOW (ref 70–100)
POC GLUCOSE: 76 mg/dL (ref 70–100)
POC GLUCOSE: 112 mg/dL — ABNORMAL HIGH (ref 70–100)
POC GLUCOSE: 122 mg/dL — ABNORMAL HIGH (ref 70–100)

## 2023-02-28 LAB — CBC
HEMATOCRIT: 33 % — ABNORMAL LOW (ref 36–45)
HEMOGLOBIN: 10 g/dL — ABNORMAL LOW (ref 12.0–15.0)
MCH: 30 pg — ABNORMAL HIGH (ref 26–34)
MCHC: 32 g/dL — ABNORMAL LOW (ref 32.0–36.0)
MCV: 94 FL — ABNORMAL HIGH (ref 80–100)
RBC COUNT: 3.5 M/UL — ABNORMAL LOW (ref 4.0–5.0)
WBC COUNT: 11 10*3/uL — ABNORMAL HIGH (ref 4.5–11.0)

## 2023-02-28 LAB — PHOSPHORUS: PHOSPHORUS: 2.7 mg/dL — ABNORMAL HIGH (ref 2.0–4.5)

## 2023-02-28 LAB — MAGNESIUM: MAGNESIUM: 1.7 mg/dL — ABNORMAL LOW (ref 1.6–2.6)

## 2023-02-28 MED ORDER — OXYCODONE 5 MG PO TAB
5-10 mg | ORAL | 0 refills | Status: DC | PRN
Start: 2023-02-28 — End: 2023-03-02
  Administered 2023-03-01 – 2023-03-02 (×6): 10 mg via ORAL

## 2023-02-28 MED ORDER — METOPROLOL TARTRATE 25 MG PO TAB
25 mg | Freq: Two times a day (BID) | ORAL | 0 refills | Status: DC
Start: 2023-02-28 — End: 2023-02-28

## 2023-02-28 MED ORDER — METOPROLOL TARTRATE 25 MG PO TAB
12.5 mg | Freq: Two times a day (BID) | ORAL | 0 refills | Status: DC
Start: 2023-02-28 — End: 2023-03-02
  Administered 2023-03-01 – 2023-03-02 (×4): 12.5 mg via ORAL

## 2023-02-28 MED ORDER — DOCUSATE SODIUM 100 MG PO CAP
100 mg | Freq: Two times a day (BID) | ORAL | 0 refills | Status: DC
Start: 2023-02-28 — End: 2023-03-02
  Administered 2023-02-28 – 2023-03-01 (×3): 100 mg via ORAL

## 2023-02-28 MED ORDER — CALCIUM CARBONATE 200 MG CALCIUM (500 MG) PO CHEW
500 mg | ORAL | 0 refills | Status: DC | PRN
Start: 2023-02-28 — End: 2023-03-08
  Administered 2023-02-28 – 2023-03-02 (×2): 500 mg via ORAL

## 2023-02-28 MED ORDER — SIMETHICONE 80 MG PO CHEW
80 mg | ORAL | 0 refills | Status: DC | PRN
Start: 2023-02-28 — End: 2023-03-08
  Administered 2023-02-28 – 2023-03-08 (×6): 80 mg via ORAL

## 2023-02-28 MED ORDER — GLYCERIN (ADULT) RE SUPP
1 | Freq: Once | RECTAL | 0 refills | Status: DC
Start: 2023-02-28 — End: 2023-03-01

## 2023-02-28 NOTE — Progress Notes
 Daily Progress Note      Today's Date:  02/28/2023  Name:  Amy Bowman                       MRN:  [id number]   Admission Date: 02/24/2023 (LOS: 4 days)                     Assessment: Amy Bowman is an 82 year old female with PMH of COPD, chronic respiratory failure (baseline O2 3 lpm), previous tobacco abuse, CAD s/p CABG, HTN, HLD, HFpEF, DM2 recent ventral hernia with secondary small bowel obstruction s/p ex lap with ventral hernia repair (4/24) now with recurrent SBO s/p ex-lap, SB resection, repair of serosal tear, and LOA (7/7).      Plan:  - NGT removed yesterday->Advance diet as tolerated   - Add colace   - BID PPI  - MMPC  - mIVF  - PTA meds as appropriate  - DVT ppx     Discussed with Dr. Mariann Shires  _____________________________________________________________________________    Subjective:  No acute events overnight. Pain well controlled. No N/V on FLD    Objective:  BP: (110-127)/(54-70)   Temp:  [36.4 ?C (97.5 ?F)-36.7 ?C (98 ?F)]   Pulse:  [82-102]   Respirations:  [16 PER MINUTE-22 PER MINUTE]   SpO2:  [94 %-100 %]   O2 Device: Nasal cannula  O2 Liter Flow: 3 Lpm  Body mass index is 42.88 kg/m?Amy Bowman    Lab Results   Component Value Date/Time    HGB 10.8 (L) 02/28/2023 07:56 AM    HCT 33.2 (L) 02/28/2023 07:56 AM    WBC 11.4 (H) 02/28/2023 07:56 AM    PLTCT 397 02/28/2023 07:56 AM    INR 1.2 01/28/2023 04:49 PM     Lab Results   Component Value Date/Time    NA 140 02/28/2023 07:56 AM    K 3.9 02/28/2023 07:56 AM    CL 106 02/28/2023 07:56 AM    CO2 25 02/28/2023 07:56 AM    BUN 19 02/28/2023 07:56 AM    CR 0.77 02/28/2023 07:56 AM    MG 1.7 02/28/2023 07:56 AM    PO4 2.7 02/28/2023 07:56 AM    CA 8.6 02/28/2023 07:56 AM     Lab Results   Component Value Date/Time    GLUPOC 112 (H) 02/28/2023 11:31 AM    GLUPOC 76 02/28/2023 08:50 AM    GLUPOC 64 (L) 02/28/2023 08:22 AM          Physical Exam  Constitutional:       Appearance: Normal appearance.   HENT:      Head: Normocephalic and atraumatic.      Nose: Nose normal.   Pulmonary:      Effort: Pulmonary effort is normal.   Abdominal:      General: There is mild distention      Palpations: Abdomen is soft.      Tenderness: appropriate TTP     Comments: Well healed surgical scars   Musculoskeletal:         General: Normal range of motion.      Cervical back: Normal range of motion.   Skin:     General: Skin is warm and dry.   Neurological:      General: No focal deficit present.      Mental Status: She is alert and oriented to person, place, and time.   Psychiatric:  Mood and Affect: Mood normal.         Behavior: Behavior normal.                                           Active Wounds          Wounds Moisture associated skin damage Anterior;Left;Proximal Thigh (Active)   02/24/23 1400   Wound Type: Moisture associated skin damage   Orientation: Anterior;Left;Proximal   Location: Thigh   Wound Location Comments:    Initial Wound Site Closure:    Initial Dressing Placed:    Initial Cycle:    Initial Suction Setting (mmHg):    Pressure Injury Stages:    Pressure Injury Present Within 24 Hours of Hospital Admission:    If This Pressure Injury Is Suspected to Be Device Related, Please Select the Device::    Is the Wound Open or Closed:    Wound Assessment Moist;Pink 02/27/23 2159   Peri-wound Assessment Intact 02/27/23 2159   Wound Drainage Amount None 02/27/23 2159   Wound Dressing Status None/open to air 02/27/23 2159   Wound Care Treatment or ointment applied 02/27/23 2159   Wound Dressing and/or Treatment A & D ointment 02/27/23 2159   Number of days: 4       Wounds Surgical incision Umbilicus (Active)   02/25/23 1318   Wound Type: Surgical incision   Orientation:    Location: Umbilicus   Wound Location Comments:    Initial Wound Site Closure: Staples   Initial Dressing Placed: Gauze;Hypafix tape   Initial Cycle:    Initial Suction Setting (mmHg):    Pressure Injury Stages:    Pressure Injury Present Within 24 Hours of Hospital Admission:    If This Pressure Injury Is Suspected to Be Device Related, Please Select the Device::    Is the Wound Open or Closed:    Wound Assessment Dressing not removed for assessment 02/27/23 2159   Peri-wound Assessment Intact 02/27/23 2159   Wound Drainage Amount Moderate 02/27/23 2159   Wound Drainage Description Serosanguineous 02/27/23 2159   Wound Dressing Status Intact 02/27/23 0930   Wound Care Dressing changed or new application 02/27/23 1800   Wound Dressing and/or Treatment Abdominal pad;Hypafix tape 02/27/23 2159   Number of days: 3                                              ___________________________    Amy Hoguet, MD

## 2023-02-28 NOTE — Care Plan
 Problem: Discharge Planning  Goal: Participation in plan of care  Outcome: Goal Ongoing  Flowsheets (Taken 02/28/2023 1000)  Participation in Plan of Care: Involve patient/caregiver in care planning decision making  Goal: Knowledge regarding plan of care  Outcome: Goal Ongoing  Flowsheets (Taken 02/28/2023 1000)  Knowledge regarding plan of care: Provide plan of care education  Goal: Prepared for discharge  Outcome: Goal Ongoing  Flowsheets (Taken 02/28/2023 1000)  Prepared for discharge: Collaborate with multidisciplinary team for hospital discharge coordination     Problem: Moderate Fall Risk  Goal: Moderate Fall Risk  Outcome: Goal Ongoing  Flowsheets (Taken 02/28/2023 1000)  Moderate Fall Risk: All patients will receive: High fall risk sign, yellow wristband, yellow socks, gait belt, and shower shoes     Problem: Anxiety  Goal: Alleviation of anxiety  Outcome: Goal Ongoing  Flowsheets (Taken 02/28/2023 1000)  Alleviation of Anxiety: Provide relaxation techniques education     Problem: Pain  Goal: Management of pain  Outcome: Goal Ongoing  Flowsheets (Taken 02/28/2023 1000)  Management of pain:   Complete pain assessment scale according to age, condition and ability to understand.   Manage pain.  Goal: Knowledge of pain management  Outcome: Goal Ongoing  Flowsheets (Taken 02/28/2023 1000)  Knowledge of pain management: Provide pain management methods education     Problem: Skin Integrity  Goal: Healing of skin (Wound & Incision)  Outcome: Goal Ongoing  Flowsheets (Taken 02/28/2023 1000)  Healing of wound (wounds and Incisions):   Implement wound/incision care as ordered   Assess wound site healing

## 2023-03-01 ENCOUNTER — Inpatient Hospital Stay: Admit: 2023-03-01 | Discharge: 2023-02-28 | Payer: MEDICARE

## 2023-03-01 ENCOUNTER — Inpatient Hospital Stay: Admit: 2023-03-01 | Discharge: 2023-03-01 | Payer: MEDICARE

## 2023-03-01 LAB — CBC
MCH: 30 pg — ABNORMAL LOW (ref 26–34)
MCHC: 32 g/dL — ABNORMAL LOW (ref 32.0–36.0)
PLATELET COUNT: 319 10*3/uL (ref 150–400)
RBC COUNT: 3.4 M/UL — ABNORMAL LOW (ref 4.0–5.0)
RDW: 20 % — ABNORMAL HIGH (ref 11–15)
WBC COUNT: 8.2 10*3/uL (ref 4.5–11.0)

## 2023-03-01 LAB — PHOSPHORUS: PHOSPHORUS: 3.2 mg/dL (ref 2.0–4.5)

## 2023-03-01 LAB — POC GLUCOSE
POC GLUCOSE: 108 mg/dL — ABNORMAL HIGH (ref 70–100)
POC GLUCOSE: 107 mg/dL — ABNORMAL HIGH (ref 70–100)

## 2023-03-01 LAB — BASIC METABOLIC PANEL
CHLORIDE: 106 MMOL/L — ABNORMAL LOW (ref 98–110)
POTASSIUM: 4.4 MMOL/L — ABNORMAL LOW (ref 3.5–5.1)
SODIUM: 138 MMOL/L — ABNORMAL LOW (ref 137–147)

## 2023-03-01 LAB — MAGNESIUM: MAGNESIUM: 1.5 mg/dL — ABNORMAL LOW (ref 1.6–2.6)

## 2023-03-01 MED ORDER — DIPHENHYDRAMINE HCL 25 MG PO CAP
50 mg | Freq: Once | ORAL | 0 refills | Status: DC
Start: 2023-03-01 — End: 2023-03-01

## 2023-03-01 MED ORDER — BISACODYL 10 MG RE SUPP
20 mg | Freq: Once | RECTAL | 0 refills | Status: DC
Start: 2023-03-01 — End: 2023-03-01

## 2023-03-01 MED ORDER — MAGNESIUM OXIDE 400 MG (241.3 MG MAGNESIUM) PO TAB
400 mg | Freq: Two times a day (BID) | ORAL | 0 refills | Status: CP
Start: 2023-03-01 — End: ?
  Administered 2023-03-01 – 2023-03-02 (×2): 400 mg via ORAL

## 2023-03-01 MED ORDER — HYDROXYZINE HCL 25 MG PO TAB
25 mg | ORAL | 0 refills | Status: DC | PRN
Start: 2023-03-01 — End: 2023-03-02
  Administered 2023-03-01 – 2023-03-02 (×4): 25 mg via ORAL

## 2023-03-01 NOTE — Progress Notes
 I have reviewed the notes, assessment, and/or procedures performed by Carolynne Citron, RN and concur with her documentation unless otherwise noted.

## 2023-03-01 NOTE — Procedures
 Vascular Access Team consulted to obtain lab specimen.    Ultrasound Used: Yes    How Many Attempts: 1    Location of Unsuccessful Attempts: 0    At 0750 approximately 5 ml of blood obtained from RAC. Patient tolerated the procedure well. Specimen labeled and sent to lab.    Patient has adequate IV access and will discharge in a day or two. No need to escalate IV access. Carolynne Citron RN advised.1

## 2023-03-01 NOTE — Progress Notes
 Patient arrived back from x-ray after agreeing to go down when her pain was more controlled. Patient had large BM on the way to x-ray and refused to allow other members of staff to clean her up downstairs. Patient requested transport bring her back to room and refused imaging. Pt. Refused imaging three times this shift. Provider S. Melton Squires, DO notified of patient refusal.

## 2023-03-01 NOTE — Progress Notes
 OCCUPATIONAL THERAPY  NOTE   Name: Amy Bowman   MRN: [id number]     DOB: 02/18/1941      Age: 82 y.o.  Admission Date: 02/24/2023     LOS: 5 days     Date of Service: 03/01/2023    Patient declined to participate in OT at this time due to fatigue.  Reports she already ambulated to doorway and back with nursing staff this morning.  Will continue to follow and provide intervention as indicated.    Therapist: Tayja Manzer, OT  Date: 03/01/2023

## 2023-03-01 NOTE — Progress Notes
 Daily Progress Note      Today's Date:  03/01/2023  Name:  Amy Bowman                       MRN:  [id number]   Admission Date: 02/24/2023 (LOS: 5 days)                     Assessment: Amy Bowman is an 82 year old female with PMH of COPD, chronic respiratory failure (baseline O2 3 lpm), previous tobacco abuse, CAD s/p CABG, HTN, HLD, HFpEF, DM2 recent ventral hernia with secondary small bowel obstruction s/p ex lap with ventral hernia repair (4/24) now with recurrent SBO s/p ex-lap, SB resection, repair of serosal tear, and LOA (7/7).      Plan:  - X-ray of hips showed incomplete unity of L inferior pubic rami fracture  - Consult ortho  - Regular diet  - BID PPI  - MMPC  - mIVF  - PTA meds as appropriate  - DVT ppx     Discussed with Dr. Mariann Shires  _____________________________________________________________________________    Subjective:  No acute events overnight. Pain well controlled. Tolerating diet. Endorsing BM    Objective:  BP: (105-152)/(45-75)   Temp:  [36.4 ?C (97.5 ?F)-37.1 ?C (98.8 ?F)]   Pulse:  [83-109]   Respirations:  [15 PER MINUTE-20 PER MINUTE]   SpO2:  [93 %-100 %]   O2 Device: Nasal cannula  O2 Liter Flow: 3 Lpm  Body mass index is 42.88 kg/m?Amy Bowman    Lab Results   Component Value Date/Time    HGB 10.5 (L) 03/01/2023 07:52 AM    HCT 31.9 (L) 03/01/2023 07:52 AM    WBC 8.2 03/01/2023 07:52 AM    PLTCT 319 03/01/2023 07:52 AM    INR 1.2 01/28/2023 04:49 PM     Lab Results   Component Value Date/Time    NA 138 03/01/2023 07:52 AM    K 4.4 03/01/2023 07:52 AM    CL 106 03/01/2023 07:52 AM    CO2 22 03/01/2023 07:52 AM    BUN 21 03/01/2023 07:52 AM    CR 0.91 03/01/2023 07:52 AM    MG 1.5 (L) 03/01/2023 07:52 AM    PO4 3.2 03/01/2023 07:52 AM    CA 8.4 (L) 03/01/2023 07:52 AM     Lab Results   Component Value Date/Time    GLUPOC 107 (H) 03/01/2023 11:18 AM    GLUPOC 103 (H) 03/01/2023 07:32 AM    GLUPOC 108 (H) 02/28/2023 10:06 PM          Physical Exam  Constitutional:       Appearance: Normal appearance. HENT:      Head: Normocephalic and atraumatic.      Nose: Nose normal.   Pulmonary:      Effort: Pulmonary effort is normal.   Abdominal:      General: There is mild distention      Palpations: Abdomen is soft.      Tenderness: appropriate TTP     Comments: Well healed surgical scars   Musculoskeletal:         General: Normal range of motion.      Cervical back: Normal range of motion.   Skin:     General: Skin is warm and dry.   Neurological:      General: No focal deficit present.      Mental Status: She is alert and oriented to person, place, and  time.   Psychiatric:         Mood and Affect: Mood normal.         Behavior: Behavior normal.                                           Active Wounds          Wounds Moisture associated skin damage Anterior;Left;Proximal Thigh (Active)   02/24/23 1400   Wound Type: Moisture associated skin damage   Orientation: Anterior;Left;Proximal   Location: Thigh   Wound Location Comments:    Initial Wound Site Closure:    Initial Dressing Placed:    Initial Cycle:    Initial Suction Setting (mmHg):    Pressure Injury Stages:    Pressure Injury Present Within 24 Hours of Hospital Admission:    If This Pressure Injury Is Suspected to Be Device Related, Please Select the Device::    Is the Wound Open or Closed:    Wound Assessment Moist;Pink 03/01/23 0800   Peri-wound Assessment Intact 03/01/23 0800   Wound Drainage Amount None 03/01/23 0800   Wound Dressing Status None/open to air 03/01/23 0800   Wound Care Dressing changed or new application 03/01/23 0515   Wound Dressing and/or Treatment A & D ointment 03/01/23 0800   Number of days: 5       Wounds Surgical incision Umbilicus (Active)   02/25/23 1318   Wound Type: Surgical incision   Orientation:    Location: Umbilicus   Wound Location Comments:    Initial Wound Site Closure: Staples   Initial Dressing Placed: Gauze;Hypafix tape   Initial Cycle:    Initial Suction Setting (mmHg):    Pressure Injury Stages:    Pressure Injury Present Within 24 Hours of Hospital Admission:    If This Pressure Injury Is Suspected to Be Device Related, Please Select the Device::    Is the Wound Open or Closed:    Wound Assessment Dry;Wound edges approximated 03/01/23 0800   Peri-wound Assessment Intact;Dry 03/01/23 0800   Wound Drainage Amount Small 03/01/23 1200   Wound Drainage Description Serosanguineous 03/01/23 0800   Wound Dressing Status Intact 03/01/23 1200   Wound Care Dressing changed or new application 03/01/23 0900   Wound Dressing and/or Treatment Abdominal pad;Gauze;Other (Comment);Hypafix tape 03/01/23 1200   Number of days: 4                                              ___________________________    Evon Hoguet, MD

## 2023-03-01 NOTE — Consults
 [facility] Consult Note      Admission Date: 02/24/2023                                                  Chief Complaint/Reason for Consult:  left periprosthetic acetabular fx with protrusio    Assessment/Plan     Amy Bowman is a 82 y.o. female with extensive pmh prior DM, tobacco use, COPD, CAD s/p CABG, bladder cancer, chronic respiratory failure (3L O2 at baseline), HTN/HLD, ventral hernia s/p repair and c/b recurrent SBO, BLE THA (LLE by Dr. Alva Jewels in [facility] 2023), who sustained a GLF getting up from the bathroom 6 months ago and sustained a periprosthetic acetabular fx with posterior column involvement and pelvic discontinuity. She has been ambulatory only for transfers since the injury. NVI on exam, closed injury. Recommend NWB LLE, ok to weight bear for transfers only. Recommend she follow up with treating surgeon Dr. Alva Jewels for definitive management for this injury.     -Dx: right periprosthetic acetabular fx w/ pelvic discontinuity and associated posterior column and inferior pubic rami fractures, recommend follow up with Dr. Alva Jewels, NWB LLE, ok to [facility] for transfers only.    -Surgical intervention: No acute surgical intervention at this time  -WB Status: NWB LLE, ok to WB for transfers only  -Diet: regular diet  -Pain control/medical management per primary  -Imaging: dedicated left hip films  -Labs: per primary  -Abx/tetanus: per primary      Procedures performed: none      Patient discussed with staff surgeon Dr. Agustina Aldrich who directed plan of care.     During normal business hours, please contact Kelsie Zimmerman, Tiffany Ward, or ortho resident at [place]. At all other times, contact the orthopedic surgery resident on call for any questions or concerns.     Caroll Cipro, MD  [id number]  ______________________________________________________________________      History of Present Illness: Amy Bowman is a 81 y.o. female with extensive pmh prior DM, tobacco use, COPD, CAD s/p CABG, bladder cancer, chronic respiratory failure (3L O2 at baseline), HTN/HLD, ventral hernia s/p repair and c/b recurrent SBO, BLE THA (LLE by Dr. Alva Jewels in [facility] 2023), who sustained a GLF getting up from the bathroom 6 months ago. She has been unable to bear weight on the LLE since. She followed up with a Dr. Vallarie Gauze in [place] who ordered a CT scan demonstrating protrusio of the left acetabular component with associated pelvic discontinuity with one screw displaced into the pelvis as well as inferior pubic rami fractures. She had difficulty following up with him further due to repeated admissions for SBO. Ortho was consulted for recommendations of this injury.       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    HLD (hyperlipidemia)     HTN (hypertension)     Hyperlipemia     Memory difficulties 01/30/2019    has a hard time remembering new information or changes in medication/health care needs    Near-syncope     Obesity 06/02/2009    Personal history of CABG (coronary artery bypass graft) 06/02/2009  Polypharmacy 01/11/2023    S/P hernia surgery 01/11/2023    Small bowel obstruction (HCC) 01/11/2023    Type II diabetes mellitus (HCC)     Vision decreased      Surgical History:   Procedure Laterality Date    HIP REPLACEMENT Bilateral 2000    CYST REMOVAL  2011    THORACIC LAMINECTOMY  08/2011    removal of spinal cord tumor    TUMOR REMOVAL  2013    SPINE     ANGIOGRAPHY CORONARY ARTERY WITH LEFT HEART CATHETERIZATION N/A 01/29/2019    Performed by Sandy Crumb, MD at [facility] CATH LAB    POSSIBLE PERCUTANEOUS CORONARY STENT PLACEMENT WITH ANGIOPLASTY N/A 01/29/2019    Performed by Sandy Crumb, MD at [facility] CATH LAB    ESOPHAGOGASTRODUODENOSCOPY WITH BIOPSY - FLEXIBLE N/A 05/30/2022    Performed by Buckles, Tino Foreman, MD at [facility] ENDO    ESOPHAGOGASTRODUODENOSCOPY WITH TRANSENDOSCOPIC BALLOON DILATION - LESS THAN 30 MM DIAMETER  05/30/2022    Performed by Buckles, Tino Foreman, MD at [facility] ENDO    ENTEROLYSIS N/A 02/25/2023    Performed by Nedra Ball, MD at [facility] OR    ENTERECTOMY RESECTION AND ANASTOMOSIS  02/25/2023    Performed by Hunter, Jocelyn E, MD at [facility] OR    SUTURE SMALL INTESTINE FOR PERFORATED ULCER/ DIVERTICULUM/ WOUND/ INJURY/ RUPTURE - SINGLE PERFORATION  02/25/2023    Performed by Hunter, Jocelyn E, MD at [facility] OR    CESAREAN SECTION      x2    DOPPLER ECHOCARDIOGRAPHY      ELECTROCARDIOGRAM      HX APPENDECTOMY      HX CORONARY ARTERY BYPASS GRAFT      LEFT HEART CATHETERIZATION      MYOCARDIAL PERFUSION IMG STUDY      SHOULDER SURGERY Right     2011     Social History     Tobacco Use    Smoking status: Former     Current packs/day: 0.00     Average packs/day: 3.0 packs/day for 40.0 years (120.0 ttl pk-yrs)     Types: Cigarettes     Start date: 07/30/1958     Quit date: 07/30/1998     Years since quitting: 24.6    Smokeless tobacco: Former     Types: Chew     Quit date: 07/02/1997    Tobacco comments:     Quit 1999   Vaping Use    Vaping status: Never Used   Substance Use Topics    Alcohol use: Yes     Comment: rarely    Drug use: No       Tobacco: former  Alcohol: denies  Drugs: denies    Family Hx: Reviewed and non-contributory  Allergies:  Celebrex [celecoxib] and Vioxx [rofecoxib]    Outpatient Medications as of 03/01/2023   Medication Sig Dispense Refill    acetaminophen (TYLENOL) 325 mg tablet Take one tablet by mouth every 6 hours as needed for Pain.      albuterol (VENTOLIN HFA, PROAIR HFA) 90 mcg/Actuation IN inhaler Inhale two puffs by mouth into the lungs every 6 hours as needed.      albuterol-ipratropium (DUONEB) 0.5 mg-3 mg(2.5 mg base)/3 mL nebulizer solution Inhale 3 mL solution by nebulizer as directed twice daily as needed.      aspirin 81 mg cap Take 1 tablet by mouth every evening.      atorvastatin (LIPITOR) 40 mg tablet TAKE ONE TABLET BY MOUTH  DAILY. INDICATIONS: HIGH CHOLESTEROL AND HIGH TRIGLYCERIDES (Patient taking differently: Take one tablet by mouth every evening.) 90 tablet 3    BIOTIN PO Take 1 tablet by mouth daily.      dexlansoprazole (DEXILANT) 30 mg capsule Take one capsule by mouth twice daily.      diclofenac sodium (VOLTAREN) 1 % topical gel Apply  topically to affected area daily as needed.      ferrous sulfate (FEOSOL) 325 mg (65 mg iron) tablet Take one tablet by mouth daily. Take on an empty stomach at least 1 hour before or 2 hours after food.      fluticasone (FLONASE) 50 mcg/actuation nasal spray Apply one spray to each nostril as directed every evening.  11    fluticasone-umeclidin-vilanter (TRELEGY ELLIPTA) 200-62.5-25 mcg inhaler Inhale one puff by mouth into the lungs daily. 60 each 1    gabapentin (NEURONTIN) 100 mg capsule Take two capsules by mouth three times daily. (Patient taking differently: Take one capsule by mouth twice daily.) 180 capsule 1    HYDROcodone/acetaminophen (NORCO) 7.5/325 mg tablet Take one tablet by mouth every 8 hours as needed for Pain.      insulin lispro(+) 75/25 (HUMALOG MIX 75/25) 100 unit/mL (75/25) SC Susp Inject  under the skin as Needed. Sliding scale      lidocaine (LIDODERM) 5 % topical patch Apply one patch topically to affected area daily. Apply patch to Right shoulder for 12 hours, then remove for 12 hours before repeating. 90 patch 1    losartan (COZAAR) 25 mg tablet Take one tablet by mouth daily. 90 tablet 3    magnesium oxide (MAGOX) 400 mg (241.3 mg magnesium) tablet Take one tablet by mouth daily.      metFORMIN-XR (GLUCOPHAGE XR) 500 mg extended release tablet Take two tablets by mouth daily with dinner.      metoprolol succinate XL (TOPROL XL) 25 mg extended release tablet TAKE 1 TABLET BY MOUTH EVERY DAY 90 tablet 3    nystatin (NYSTOP) 100,000 unit/g topical powder Apply  topically to affected area four times daily.      polyethylene glycol 3350 (MIRALAX) 17 g packet Take one packet by mouth twice daily as needed.      torsemide (DEMADEX) 20 mg tablet Take one tablet by mouth daily.      zafirlukast(+) (ACCOLATE) 20 mg PO tablet Take one tablet by mouth twice daily.           Review of Systems:    Positive: left hip pain  Negative: Denies fevers, chillss    Otherwise, 10 point ROS was negative except the pertinent information included in the HPI    Vital Signs:  Last Filed in 24 hours   BP: 116/54 (07/11 1725)  Temp: 36.4 ?C (97.5 ?F) (07/11 1725)  Pulse: 88 (07/11 1725)  Respirations: 17 PER MINUTE (07/11 1725)  SpO2: 94 % (07/11 1725)  O2 Device: Nasal cannula (07/11 1725)  O2 Liter Flow: 3 Lpm (07/11 1725)     Physical Exam:    Constitutional: A&O, NAD  HEENT: EOMI, normocephalic  Respiratory:  Unlabored respirations  Cardiovascular: Regular rate  Skin: No open fractures, rashes, abrasions, lacerations  Musculoskeletal: LLE examined.   left lower extremity compartments compressible and no pain with passive stretch. Intact sensation to the superficial peroneal, deep peroneal, tibial, sural, saphenous nerve distributions.  Demonstrates plantarflexion and dorsiflexion of the ankle, EHL and FHL function.  Pain with internal rotation.  Lab/Radiology/Other Diagnostic Tests:         Radiology: Reviewed    CBC w/Diff   Lab Results   Component Value Date/Time    WBC 8.2 03/01/2023 07:52 AM    HGB 10.5 (L) 03/01/2023 07:52 AM    HCT 31.9 (L) 03/01/2023 07:52 AM    PLTCT 319 03/01/2023 07:52 AM         Inflammatory Markers   No results found for: ESR, CRP     Coagulation Studies   Lab Results   Component Value Date/Time    PT 13.2 (H) 01/28/2023 04:49 PM    INR 1.2 01/28/2023 04:49 PM        Basic Metabolic Profile   Lab Results   Component Value Date/Time    NA 138 03/01/2023 07:52 AM    K 4.4 03/01/2023 07:52 AM    CL 106 03/01/2023 07:52 AM    CO2 22 03/01/2023 07:52 AM    GAP 10 03/01/2023 07:52 AM    BUN 21 03/01/2023 07:52 AM    CR 0.91 03/01/2023 07:52 AM    GLU 107 (H) 03/01/2023 07:52 AM        HIPS MIN 5 VIEWS W PELVIS BILAT   Final Result         1.  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 No periprosthetic fractures. 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      2.  Ununited or incompletely healed left inferior pubic ramus fracture.      3.  Mild-to-moderate degenerative changes symphysis pubis and severe degenerative changes lower lumbar spine.      4.  Surgical clip overlies the central pelvis. Multiple dilated bowel loops are partially visualized, better seen on recent CT scan. Skin staples overlie the left pelvis.                Finalized by Sharyle Deer, M.D. on 03/01/2023 11:12 AM. Dictated by Sharyle Deer, M.D. on 03/01/2023 11:08 AM.         FEEDING TUBE PLCMNT (ABD/CHEST LMTD)   Final Result   FINDINGS/IMPRESSION:      The lower thorax and abdomen are included in the field-of-view. There has been placement of a gastric tube with its tip and distal fenestration overlying the region of the stomach. The visualized bowel gas pattern is nonobstructive.          Finalized by Heinz Llano, D.O. on 02/25/2023 7:46 AM. Dictated by Heinz Llano, D.O. on 02/25/2023 7:46 AM.         CT ABD/PEL EXTERNAL IMAGING   Final Result      POC ANES US  GUIDED NERVE BLOCK    (Results Pending)

## 2023-03-02 ENCOUNTER — Inpatient Hospital Stay: Admit: 2023-03-02 | Discharge: 2023-03-02 | Payer: MEDICARE

## 2023-03-02 ENCOUNTER — Encounter: Admit: 2023-03-02 | Discharge: 2023-03-02 | Payer: MEDICARE

## 2023-03-02 LAB — POC GLUCOSE
POC GLUCOSE: 52 mg/dL — ABNORMAL LOW (ref 70–100)
POC GLUCOSE: 63 mg/dL — ABNORMAL LOW (ref 70–100)
POC GLUCOSE: 56 mg/dL — ABNORMAL LOW (ref 70–100)
POC GLUCOSE: 74 mg/dL (ref 70–100)
POC GLUCOSE: 104 mg/dL — ABNORMAL HIGH (ref 70–100)
POC GLUCOSE: 179 mg/dL — ABNORMAL HIGH (ref 70–100)
POC GLUCOSE: 109 mg/dL — ABNORMAL HIGH (ref 70–100)
POC GLUCOSE: 92 mg/dL (ref 70–100)

## 2023-03-02 LAB — MAGNESIUM: MAGNESIUM: 1.5 mg/dL — ABNORMAL LOW (ref 1.6–2.6)

## 2023-03-02 LAB — IRON + BINDING CAPACITY + %SAT+ FERRITIN
% SATURATION: 19 % — ABNORMAL LOW (ref 28–42)
FERRITIN: 287 ng/mL — ABNORMAL HIGH (ref 10–200)
IRON BINDING: 177 ug/dL — ABNORMAL LOW (ref 270–380)
IRON: 34 ug/dL — ABNORMAL LOW (ref 50–160)

## 2023-03-02 LAB — VITAMIN B12: VITAMIN B12: 576 pg/mL (ref 180–914)

## 2023-03-02 LAB — PHOSPHORUS: PHOSPHORUS: 2.8 mg/dL (ref 2.0–4.5)

## 2023-03-02 MED ORDER — MAGNESIUM OXIDE 400 MG (241.3 MG MAGNESIUM) PO TAB
400 mg | Freq: Two times a day (BID) | ORAL | 0 refills | Status: DC
Start: 2023-03-02 — End: 2023-03-02
  Administered 2023-03-02: 18:00:00 400 mg via ORAL

## 2023-03-02 MED ORDER — ACETAMINOPHEN 1,000 MG/100 ML (10 MG/ML) IV SOLN
1000 mg | INTRAVENOUS | 0 refills | Status: DC
Start: 2023-03-02 — End: 2023-03-06
  Administered 2023-03-03 – 2023-03-06 (×12): 1000 mg via INTRAVENOUS

## 2023-03-02 MED ORDER — LACTATED RINGERS IV SOLP
INTRAVENOUS | 0 refills | Status: DC
Start: 2023-03-02 — End: 2023-03-03
  Administered 2023-03-02 – 2023-03-03 (×2): 1000.0000 mL via INTRAVENOUS

## 2023-03-02 MED ORDER — ADULT PN CONTINUOUS-ION BASED
INTRAVENOUS | 0 refills | Status: DC
Start: 2023-03-02 — End: 2023-03-03

## 2023-03-02 MED ORDER — FENTANYL CITRATE (PF) 50 MCG/ML IJ SOLN
12.5-25 ug | INTRAVENOUS | 0 refills | Status: DC | PRN
Start: 2023-03-02 — End: 2023-03-04
  Administered 2023-03-03 (×2): 12.5 ug via INTRAVENOUS
  Administered 2023-03-04 (×2): 25 ug via INTRAVENOUS

## 2023-03-02 MED ORDER — PANTOPRAZOLE 40 MG IV SOLR
40 mg | Freq: Two times a day (BID) | INTRAVENOUS | 0 refills | Status: DC
Start: 2023-03-02 — End: 2023-03-06
  Administered 2023-03-03 – 2023-03-06 (×8): 40 mg via INTRAVENOUS

## 2023-03-02 MED ORDER — PHENOL 1.4 % MM SPRA
2 | OROMUCOSAL | 0 refills | Status: DC | PRN
Start: 2023-03-02 — End: 2023-03-06
  Administered 2023-03-02: 21:00:00 2 via OROMUCOSAL

## 2023-03-02 MED ORDER — LIDOCAINE HCL 2 % MM JELP
TOPICAL | 0 refills | Status: DC | PRN
Start: 2023-03-02 — End: 2023-03-08
  Administered 2023-03-02: 21:00:00 20.0000 mL via TOPICAL

## 2023-03-02 MED ORDER — IOHEXOL 350 MG IODINE/ML IV SOLN
100 mL | Freq: Once | INTRAVENOUS | 0 refills | Status: CP
Start: 2023-03-02 — End: ?
  Administered 2023-03-02: 17:00:00 100 mL via INTRAVENOUS

## 2023-03-02 MED ORDER — MAGNESIUM SULFATE IN WATER 4 GRAM/50 ML (8 %) IV PGBK
4 g | Freq: Once | INTRAVENOUS | 0 refills | Status: CP
Start: 2023-03-02 — End: ?
  Administered 2023-03-02: 20:00:00 4 g via INTRAVENOUS

## 2023-03-02 MED ORDER — METOPROLOL TARTRATE 5 MG/5 ML IV SOLN
2.5 mg | INTRAVENOUS | 0 refills | Status: DC
Start: 2023-03-02 — End: 2023-03-07
  Administered 2023-03-02 – 2023-03-07 (×14): 2.5 mg via INTRAVENOUS

## 2023-03-02 MED ORDER — DOCUSATE SODIUM 100 MG PO CAP
100 mg | Freq: Once | ORAL | 0 refills | Status: DC
Start: 2023-03-02 — End: 2023-03-02

## 2023-03-02 MED ORDER — METHOCARBAMOL 100 MG/ML IJ SOLN
500 mg | INTRAVENOUS | 0 refills | Status: DC
Start: 2023-03-02 — End: 2023-03-06
  Administered 2023-03-02 – 2023-03-06 (×13): 500 mg via INTRAVENOUS

## 2023-03-02 MED ORDER — POTASSIUM, SODIUM PHOSPHATES 280-160-250 MG PO PWPK
2 | Freq: Once | ORAL | 0 refills | Status: DC
Start: 2023-03-02 — End: 2023-03-02

## 2023-03-02 MED ORDER — SODIUM CHLORIDE 0.9 % IJ SOLN
50 mL | Freq: Once | INTRAVENOUS | 0 refills | Status: CP
Start: 2023-03-02 — End: ?
  Administered 2023-03-02: 17:00:00 50 mL via INTRAVENOUS

## 2023-03-02 MED ORDER — DOCUSATE SODIUM 100 MG PO CAP
100 mg | Freq: Every day | ORAL | 0 refills | Status: DC | PRN
Start: 2023-03-02 — End: 2023-03-02

## 2023-03-02 NOTE — Procedures
 Vascular Access Team consulted for PICC placement. Patient has history of bilateral shoulder surgeries and cannot position her arms out far enough for me to assess for and place a PICC. Patient will need to go to IR for placement of a tunneled CVC. Reached out to Dr. Andree Kayser via voalte with VATs recommendations.

## 2023-03-02 NOTE — Progress Notes
 CLINICAL NUTRITION                                                        Clinical Nutrition Initial Assessment    Name: Amy Bowman   MRN: [id number]     DOB: 1941/05/28      Age: 82 y.o.  Admission Date: 02/24/2023     LOS: 6 days     Date of Service: 03/02/2023        Recommendation:  If anticipated prolonged NPO status, consider initiation of parenteral nutrition.      Comments:  Amy Bowman is an 82 year old female with PMH of COPD, chronic respiratory failure (baseline O2 3 lpm), previous tobacco abuse, CAD s/p CABG, HTN, HLD, HFpEF, DM2 recent ventral hernia with secondary small bowel obstruction s/p ex lap with ventral hernia repair (4/24) now with recurrent SBO s/p ex-lap, SB resection, repair of serosal tear, and LOA (7/7).     Clinical Nutrition visited today for nutrition LOS screen.  Brief visit with patient this afternoon as she was sleepy and nodding off.  She reported not eating well after surgery and is now NPO.  Good appetite and po intake prior to admission report.  Patient stated that she thought she was doing pretty well with eating before hospitalization.  Noted possible ileus vs early post-op SBO; NG tube to be placed.  Noted consult placed this afternoon for NSS to start TPN (after 1:00 pm TPN ordering deadline); plan for NSS to assess for TPN tomorrow.      Nutrition Assessment of Patient:  Admit Weight: 96.3 kg (Bedscale 7/6);  ; Desired Weight: 56 kg  BMI (Calculated): 42.88;  ;      Pertinent Allergies/Intolerances: Reviewed  Pertinent Labs: Low blood sugars during admission. Mg 1.5; Pertinent Meds: Calcium carbonate, Colace, Mg ox, Zofran prn, Oxycodone, Protonix, Phos-Nak;    Oral Diet Order: NPO;       Current Oral Intake: Inadequate;NPO           Estimated Calorie Needs: 1400-1680 (25-30 kcal/kg DBW 56 kg)  Estimated Protein Needs: 67-84 (1.2-1.5 g/kg DBW 56 kg)    Malnutrition Assessment:   Malnutrition present  ICD-10 code E44: Acute illness/Moderate non-severe malnutrition  Energy Intake: Less than 75% of estimated energy requirement for greater than 7days, Mild loss of muscle mass, Mild fluid accumulation                Malnutrition Interventions: Initiation of nutrition support    Nutrition Focused Physical Assessment:   Loss of Subcutaneous Fat: Yes; Severity: Mild; Location: Orbital  Muscle Wasting: Yes; Severity: Mild; Location: Temple  Edema: Yes; Severity: Mild; Location: Lower extremities     Pressure Injury: None noted     Comment: Last BM 7/12    Nutrition Diagnosis:  Inadequate oral intake  Etiology: Altered GI function  Signs & Symptoms: Pt not meeting estimated needs throughout admission; NPO status                      Intervention / Plan:  Monitor duration of NPO status and intitation of nutrition          Goals:  Initiate nutrition  Time Frame: Within 24 hours  Amy Bowman RD, LD, CNSC  Available on Voalte

## 2023-03-02 NOTE — Progress Notes
 Daily Progress Note      Today's Date:  03/02/2023  Name:  Amy Bowman                       MRN:  [id number]   Admission Date: 02/24/2023 (LOS: 6 days)                     Assessment: Amy Bowman is an 82 year old female with PMH of COPD, chronic respiratory failure (baseline O2 3 lpm), previous tobacco abuse, CAD s/p CABG, HTN, HLD, HFpEF, DM2 recent ventral hernia with secondary small bowel obstruction s/p ex lap with ventral hernia repair (4/24) now with recurrent SBO s/p ex-lap, SB resection, repair of serosal tear, and LOA (7/7).      Plan:  - Ortho consulted for hip pain-recommend to follow outpatient  - Clear fluid from midline incision, c/f dehiscence -> order stat CT CAP  - Regular diet  - Consult geriatrics for chronic medical conditions  - BID PPI  - MMPC  - mIVF  - PTA meds as appropriate  - DVT ppx     Discussed with Dr. Mariann Shires  _____________________________________________________________________________    Subjective:  No acute events overnight. Patient very anxious. Several bowel movements overnight. Unable to tolerate ambulating due to hip pain    Objective:  BP: (101-127)/(54-63)   Temp:  [36.3 ?C (97.4 ?F)-36.7 ?C (98.1 ?F)]   Pulse:  [75-95]   Respirations:  [15 PER MINUTE-20 PER MINUTE]   SpO2:  [94 %-100 %]   O2 Device: Nasal cannula  O2 Liter Flow: 3 Lpm  Body mass index is 42.88 kg/m?Amy Bowman    Lab Results   Component Value Date/Time    HGB 10.6 (L) 03/02/2023 08:57 AM    HCT 32.5 (L) 03/02/2023 08:57 AM    WBC 8.4 03/02/2023 08:57 AM    PLTCT 369 03/02/2023 08:57 AM    INR 1.2 01/28/2023 04:49 PM     Lab Results   Component Value Date/Time    NA 139 03/02/2023 08:57 AM    K 3.5 03/02/2023 08:57 AM    CL 104 03/02/2023 08:57 AM    CO2 27 03/02/2023 08:57 AM    BUN 19 03/02/2023 08:57 AM    CR 0.92 03/02/2023 08:57 AM    MG 1.5 (L) 03/02/2023 08:57 AM    PO4 2.8 03/02/2023 08:57 AM    CA 8.2 (L) 03/02/2023 08:57 AM     Lab Results   Component Value Date/Time    GLUPOC 104 (H) 03/02/2023 10:46 AM GLUPOC 74 03/02/2023 09:19 AM    GLUPOC 56 (L) 03/02/2023 08:34 AM          Physical Exam  Constitutional:       Appearance: Normal appearance.   HENT:      Head: Normocephalic and atraumatic.      Nose: Nose normal.   Pulmonary:      Effort: Pulmonary effort is normal.   Abdominal:      General: There is mild distention      Palpations: Abdomen is soft.      Tenderness: appropriate TTP     Comments: Well healed surgical scars   Musculoskeletal:         General: Normal range of motion.      Cervical back: Normal range of motion.   Skin:     General: Skin is warm and dry.   Neurological:      General:  No focal deficit present.      Mental Status: She is alert and oriented to person, place, and time.   Psychiatric:         Mood and Affect: Mood normal.         Behavior: Behavior normal.                                           Active Wounds          Wounds Moisture associated skin damage Anterior;Left;Proximal Thigh (Active)   02/24/23 1400   Wound Type: Moisture associated skin damage   Orientation: Anterior;Left;Proximal   Location: Thigh   Wound Location Comments:    Initial Wound Site Closure:    Initial Dressing Placed:    Initial Cycle:    Initial Suction Setting (mmHg):    Pressure Injury Stages:    Pressure Injury Present Within 24 Hours of Hospital Admission:    If This Pressure Injury Is Suspected to Be Device Related, Please Select the Device::    Is the Wound Open or Closed:    Wound Assessment Moist;Pink 03/01/23 2133   Peri-wound Assessment Intact 03/01/23 2133   Wound Drainage Amount None 03/01/23 2133   Wound Dressing Status None/open to air 03/01/23 2133   Wound Care Dressing changed or new application 03/01/23 0515   Wound Dressing and/or Treatment A & D ointment 03/01/23 2133   Number of days: 6       Wounds Surgical incision Umbilicus (Active)   02/25/23 1318   Wound Type: Surgical incision   Orientation:    Location: Umbilicus   Wound Location Comments:    Initial Wound Site Closure: Staples Initial Dressing Placed: Gauze;Hypafix tape   Initial Cycle:    Initial Suction Setting (mmHg):    Pressure Injury Stages:    Pressure Injury Present Within 24 Hours of Hospital Admission:    If This Pressure Injury Is Suspected to Be Device Related, Please Select the Device::    Is the Wound Open or Closed:    Wound Assessment Moist;Pink 03/02/23 0900   Wound Site Closure Staples 03/02/23 0900   Peri-wound Assessment Dry;Intact 03/02/23 0900   Wound Drainage Amount Large 03/02/23 0900   Wound Drainage Description Serosanguineous;Other (Comment) 03/02/23 0900   Wound Dressing Status Intact 03/02/23 0900   Wound Care Dressing changed or new application 03/02/23 0900   Wound Dressing and/or Treatment Dry gauze;Abdominal pad;Hypafix tape 03/02/23 0900   Number of days: 5                                              ___________________________    Evon Hoguet, MD

## 2023-03-02 NOTE — Progress Notes
 Okay to use NGT per Melton Squires.

## 2023-03-02 NOTE — Progress Notes
 OCCUPATIONAL THERAPY  NOTE   Name: Amy Bowman   MRN: [id number]     DOB: Aug 03, 1941      Age: 82 y.o.  Admission Date: 02/24/2023     LOS: 6 days     Date of Service: 03/02/2023      Attempted OT session this morning. Per ortho note 7/11, patient now NWB LLE (ok to WB for transfers only). Patient sleeping upon OT arrival but easy to wake. Reports significant abdominal pain. Patient fixating on abdominal dressing and wanting it removed. Patient instructed to keep dressing in place. RN notifed. Patient declines therapy session at this time. Occupational therapy will continue to follow and provide intervention as indicated.      Therapist: Annabell Baron, OTR/L [id number]  Date: 03/02/2023

## 2023-03-02 NOTE — Progress Notes
 RT Adult Assessment Note    NAME:Amy Bowman             MRN: [id number]             DOB:Feb 27, 1941          AGE: 82 y.o.  ADMISSION DATE: 02/24/2023             DAYS ADMITTED: LOS: 6 days    Additional Comments:  Impressions of the patient: pt alert and laying in bed on 3Lpm nasal cannula. NAD  Intervention(s)/outcome(s): completed RT protocol evaluation and scheduled treatments  Patient education that was completed: n/a  Recommendations to the care team: no changes at this time, encourage IS    Vital Signs:  Pulse:    RR: 18 PER MINUTE  SpO2: 99 %  O2 Device: Nasal cannula  Liter Flow: 3 Lpm  O2%:      Breath Sounds:      Respiratory Effort:      Comments:

## 2023-03-03 ENCOUNTER — Inpatient Hospital Stay: Admit: 2023-03-03 | Discharge: 2023-03-03 | Payer: MEDICARE

## 2023-03-03 ENCOUNTER — Encounter: Admit: 2023-03-03 | Discharge: 2023-03-03 | Payer: MEDICARE

## 2023-03-03 DIAGNOSIS — I10 Essential (primary) hypertension: Secondary | ICD-10-CM

## 2023-03-03 DIAGNOSIS — Z79899 Other long term (current) drug therapy: Secondary | ICD-10-CM

## 2023-03-03 DIAGNOSIS — E669 Obesity, unspecified: Secondary | ICD-10-CM

## 2023-03-03 DIAGNOSIS — I2510 Atherosclerotic heart disease of native coronary artery without angina pectoris: Secondary | ICD-10-CM

## 2023-03-03 DIAGNOSIS — K439 Ventral hernia without obstruction or gangrene: Secondary | ICD-10-CM

## 2023-03-03 DIAGNOSIS — R413 Other amnesia: Secondary | ICD-10-CM

## 2023-03-03 DIAGNOSIS — K56609 Unspecified intestinal obstruction, unspecified as to partial versus complete obstruction: Secondary | ICD-10-CM

## 2023-03-03 DIAGNOSIS — E785 Hyperlipidemia, unspecified: Secondary | ICD-10-CM

## 2023-03-03 DIAGNOSIS — E119 Type 2 diabetes mellitus without complications: Secondary | ICD-10-CM

## 2023-03-03 DIAGNOSIS — J439 Emphysema, unspecified: Secondary | ICD-10-CM

## 2023-03-03 DIAGNOSIS — M549 Dorsalgia, unspecified: Secondary | ICD-10-CM

## 2023-03-03 DIAGNOSIS — H547 Unspecified visual loss: Secondary | ICD-10-CM

## 2023-03-03 DIAGNOSIS — M1990 Unspecified osteoarthritis, unspecified site: Secondary | ICD-10-CM

## 2023-03-03 DIAGNOSIS — Z9889 Other specified postprocedural states: Secondary | ICD-10-CM

## 2023-03-03 DIAGNOSIS — J45909 Unspecified asthma, uncomplicated: Secondary | ICD-10-CM

## 2023-03-03 LAB — TYPE & CROSSMATCH
ANTIBODY SCREEN: NEGATIVE
UNITS ORDERED: 0

## 2023-03-03 LAB — COMPREHENSIVE METABOLIC PANEL CELLULAR THERAPEUTICS
ALBUMIN: 2 g/dL — ABNORMAL LOW (ref 3.5–5.0)
ALK PHOSPHATASE: 107 U/L (ref 25–110)
ALT: 8 U/L (ref 7–56)
ANION GAP: 7 s — ABNORMAL HIGH (ref 3–12)
AST: 12 U/L (ref 7–40)
CALCIUM: 7.7 mg/dL — ABNORMAL LOW (ref 8.5–10.6)
CO2: 24 MMOL/L (ref 21–30)
CREATININE: 0.8 mg/dL — ABNORMAL LOW (ref 0.4–1.00)
EGFR: >60 mL/min — ABNORMAL HIGH (ref >60)
GLUCOSE,PANEL: 59 mg/dL — ABNORMAL LOW (ref 70–100)
POTASSIUM: 3.8 MMOL/L (ref 3.5–5.1)
SODIUM: 139 MMOL/L (ref 137–147)
TOTAL BILIRUBIN: 0.3 mg/dL (ref 0.2–1.3)
TOTAL PROTEIN: 4.3 g/dL — ABNORMAL LOW (ref <150)

## 2023-03-03 LAB — CBC
HEMATOCRIT: 29 % — ABNORMAL LOW (ref 36–45)
HEMOGLOBIN: 9.5 g/dL — ABNORMAL LOW (ref 12.0–15.0)
PLATELET COUNT: 302 10*3/uL (ref 150–400)
RBC COUNT: 3.1 M/UL — ABNORMAL LOW (ref 4.0–5.0)
WBC COUNT: 6.5 10*3/uL (ref >60)

## 2023-03-03 LAB — POC GLUCOSE
POC GLUCOSE: 83 mg/dL (ref 70–100)
POC GLUCOSE: 77 mg/dL (ref 70–100)
POC GLUCOSE: 61 mg/dL — ABNORMAL LOW (ref 70–100)
POC GLUCOSE: 66 mg/dL — ABNORMAL LOW (ref 70–100)
POC GLUCOSE: 108 mg/dL — ABNORMAL HIGH (ref 70–100)
POC GLUCOSE: 74 mg/dL (ref 70–100)
POC GLUCOSE: 61 mg/dL — ABNORMAL LOW (ref 70–100)

## 2023-03-03 LAB — PHOSPHORUS  CELLULAR THERAPEUTICS: PHOSPHORUS: 3.1 mg/dL (ref 2.0–4.5)

## 2023-03-03 MED ORDER — ROCURONIUM 10 MG/ML IV SOLN
INTRAVENOUS | 0 refills | Status: DC
Start: 2023-03-03 — End: 2023-03-03

## 2023-03-03 MED ORDER — ADULT PN CONTINUOUS-ION BASED
INTRAVENOUS | 0 refills | Status: DC
Start: 2023-03-03 — End: 2023-03-03

## 2023-03-03 MED ORDER — ESMOLOL 100 MG/10 ML (10 MG/ML) IV SOLN
INTRAVENOUS | 0 refills | Status: DC
Start: 2023-03-03 — End: 2023-03-03

## 2023-03-03 MED ORDER — HALOPERIDOL LACTATE 5 MG/ML IJ SOLN
1 mg | Freq: Once | INTRAVENOUS | 0 refills | Status: DC | PRN
Start: 2023-03-03 — End: 2023-03-04

## 2023-03-03 MED ORDER — ARTIFICIAL TEARS (PF) SINGLE DOSE DROPS GROUP
OPHTHALMIC | 0 refills | Status: DC
Start: 2023-03-03 — End: 2023-03-03

## 2023-03-03 MED ORDER — METOCLOPRAMIDE HCL 5 MG/ML IJ SOLN
10 mg | Freq: Once | INTRAVENOUS | 0 refills | Status: DC | PRN
Start: 2023-03-03 — End: 2023-03-04

## 2023-03-03 MED ORDER — FENTANYL CITRATE (PF) 50 MCG/ML IJ SOLN
25 ug | INTRAVENOUS | 0 refills | Status: DC | PRN
Start: 2023-03-03 — End: 2023-03-04

## 2023-03-03 MED ORDER — ADULT PN CONTINUOUS-ION BASED
INTRAVENOUS | 0 refills | Status: CP
Start: 2023-03-03 — End: ?
  Administered 2023-03-04 – 2023-03-05 (×2): 1800.0000 mL via INTRAVENOUS

## 2023-03-03 MED ORDER — OXYCODONE 5 MG PO TAB
5 mg | Freq: Once | ORAL | 0 refills | Status: DC | PRN
Start: 2023-03-03 — End: 2023-03-04

## 2023-03-03 MED ORDER — FENTANYL CITRATE (PF) 50 MCG/ML IJ SOLN
INTRAVENOUS | 0 refills | Status: DC
Start: 2023-03-03 — End: 2023-03-03

## 2023-03-03 MED ORDER — LACTATED RINGERS IV SOLP
1000 mL | INTRAVENOUS | 0 refills | Status: AC
Start: 2023-03-03 — End: ?
  Administered 2023-03-03: 22:00:00 1000 mL via INTRAVENOUS

## 2023-03-03 MED ORDER — DEXAMETHASONE SODIUM PHOSPHATE 4 MG/ML IJ SOLN
INTRAVENOUS | 0 refills | Status: DC
Start: 2023-03-03 — End: 2023-03-03

## 2023-03-03 MED ORDER — FENTANYL CITRATE (PF) 50 MCG/ML IJ SOLN
25-50 ug | INTRAVENOUS | 0 refills | Status: DC | PRN
Start: 2023-03-03 — End: 2023-03-07
  Administered 2023-03-04 – 2023-03-05 (×10): 50 ug via INTRAVENOUS
  Administered 2023-03-05: 19:00:00 25 ug via INTRAVENOUS
  Administered 2023-03-05: 08:00:00 50 ug via INTRAVENOUS
  Administered 2023-03-05: 11:00:00 25 ug via INTRAVENOUS
  Administered 2023-03-05 – 2023-03-07 (×14): 50 ug via INTRAVENOUS

## 2023-03-03 MED ORDER — CEFOXITIN 2 GRAM IV SOLR
INTRAVENOUS | 0 refills | Status: DC
Start: 2023-03-03 — End: 2023-03-03

## 2023-03-03 MED ORDER — DEXTROSE 50 % IN WATER (D50W) IV SYRG
50 mL | Freq: Once | INTRAVENOUS | 0 refills | Status: AC
Start: 2023-03-03 — End: ?

## 2023-03-03 MED ORDER — PROPOFOL INJ 10 MG/ML IV VIAL
INTRAVENOUS | 0 refills | Status: DC
Start: 2023-03-03 — End: 2023-03-03

## 2023-03-03 MED ORDER — SUGAMMADEX 100 MG/ML IV SOLN
INTRAVENOUS | 0 refills | Status: DC
Start: 2023-03-03 — End: 2023-03-03

## 2023-03-03 MED ORDER — ONDANSETRON HCL (PF) 4 MG/2 ML IJ SOLN
INTRAVENOUS | 0 refills | Status: DC
Start: 2023-03-03 — End: 2023-03-03

## 2023-03-03 MED ORDER — ROPIVACAINE (PF) 2 MG/ML (0.2 %) IJ SOLN
Freq: Once | 0 refills | Status: DC | PRN
Start: 2023-03-03 — End: 2023-03-04

## 2023-03-03 MED ORDER — DIPHENHYDRAMINE HCL 50 MG/ML IJ SOLN
25 mg | Freq: Once | INTRAVENOUS | 0 refills | Status: DC | PRN
Start: 2023-03-03 — End: 2023-03-04

## 2023-03-03 MED ORDER — LIDOCAINE (PF) 200 MG/10 ML (2 %) IJ SYRG
INTRAVENOUS | 0 refills | Status: DC
Start: 2023-03-03 — End: 2023-03-03

## 2023-03-03 MED ORDER — HYDROMORPHONE (PF) 2 MG/ML IJ SYRG
.5 mg | INTRAVENOUS | 0 refills | Status: DC | PRN
Start: 2023-03-03 — End: 2023-03-04
  Administered 2023-03-04: 01:00:00 0.5 mg via INTRAVENOUS

## 2023-03-03 MED ORDER — FENTANYL CITRATE (PF) 50 MCG/ML IJ SOLN
50 ug | INTRAVENOUS | 0 refills | Status: DC | PRN
Start: 2023-03-03 — End: 2023-03-04
  Administered 2023-03-04 (×2): 50 ug via INTRAVENOUS

## 2023-03-03 MED ORDER — IPRATROPIUM-ALBUTEROL 0.5 MG-3 MG(2.5 MG BASE)/3 ML IN NEBU
3 mL | Freq: Once | RESPIRATORY_TRACT | 0 refills | Status: DC | PRN
Start: 2023-03-03 — End: 2023-03-04

## 2023-03-03 NOTE — Anesthesia Pre-Procedure Evaluation
Anesthesia Pre-Procedure Evaluation    Name: Amy Bowman      MRN: [id number]     DOB: 09/06/1940     Age: 82 y.o.     Sex: female   _________________________________________________________________________     Procedure Info:   Procedure Information       Date/Time: 03/03/23 1700    Procedure: REOPENING RECENT LAPAROTOMY - open midline, fascial closure    Location: MAIN OR 11 / Main OR/Periop    Surgeons: Robbi Garter, MD            Physical Assessment  Vital Signs (last filed in past 24 hours):  BP: 96/48 (07/13 1325)  Temp: 36.6 ?C (97.9 ?F) (07/13 1325)  Pulse: 96 (07/13 1600)  Respirations: 18 PER MINUTE (07/13 1325)  SpO2: 94 % (07/13 1325)  O2 Device: Nasal cannula (07/13 1325)  O2 Liter Flow: 1 Lpm (07/13 1325)  Weight: 98.2 kg (216 lb 7.9 oz) (07/13 0641)      Patient History   Allergies   Allergen Reactions    Celebrex [Celecoxib] RASH    Vioxx [Rofecoxib] RASH        Current Medications    Medication Directions   acetaminophen (TYLENOL) 325 mg tablet Take one tablet by mouth every 6 hours as needed for Pain.   albuterol (VENTOLIN HFA, PROAIR HFA) 90 mcg/Actuation IN inhaler Inhale two puffs by mouth into the lungs every 6 hours as needed.   albuterol-ipratropium (DUONEB) 0.5 mg-3 mg(2.5 mg base)/3 mL nebulizer solution Inhale 3 mL solution by nebulizer as directed twice daily as needed.   aspirin 81 mg cap Take 1 tablet by mouth every evening.   atorvastatin (LIPITOR) 40 mg tablet TAKE ONE TABLET BY MOUTH DAILY. INDICATIONS: HIGH CHOLESTEROL AND HIGH TRIGLYCERIDES  Patient taking differently: Take one tablet by mouth every evening.   BIOTIN PO Take 1 tablet by mouth daily.   dexlansoprazole (DEXILANT) 30 mg capsule Take one capsule by mouth twice daily.   diclofenac sodium (VOLTAREN) 1 % topical gel Apply  topically to affected area daily as needed.   ferrous sulfate (FEOSOL) 325 mg (65 mg iron) tablet Take one tablet by mouth daily. Take on an empty stomach at least 1 hour before or 2 hours after food.   fluticasone (FLONASE) 50 mcg/actuation nasal spray Apply one spray to each nostril as directed every evening.   fluticasone-umeclidin-vilanter (TRELEGY ELLIPTA) 200-62.5-25 mcg inhaler Inhale one puff by mouth into the lungs daily.   gabapentin (NEURONTIN) 100 mg capsule Take two capsules by mouth three times daily.  Patient taking differently: Take one capsule by mouth twice daily.   HYDROcodone/acetaminophen (NORCO) 7.5/325 mg tablet Take one tablet by mouth every 8 hours as needed for Pain.   insulin lispro(+) 75/25 (HUMALOG MIX 75/25) 100 unit/mL (75/25) SC Susp Inject  under the skin as Needed. Sliding scale   lidocaine (LIDODERM) 5 % topical patch Apply one patch topically to affected area daily. Apply patch to Right shoulder for 12 hours, then remove for 12 hours before repeating.   losartan (COZAAR) 25 mg tablet Take one tablet by mouth daily.   magnesium oxide (MAGOX) 400 mg (241.3 mg magnesium) tablet Take one tablet by mouth daily.   metFORMIN-XR (GLUCOPHAGE XR) 500 mg extended release tablet Take two tablets by mouth daily with dinner.   metoprolol succinate XL (TOPROL XL) 25 mg extended release tablet TAKE 1 TABLET BY MOUTH EVERY DAY   nystatin (NYSTOP) 100,000 unit/g topical powder Apply  topically to affected area four times daily.   polyethylene glycol 3350 (MIRALAX) 17 g packet Take one packet by mouth twice daily as needed.   torsemide (DEMADEX) 20 mg tablet Take one tablet by mouth daily.   zafirlukast(+) (ACCOLATE) 20 mg PO tablet Take one tablet by mouth twice daily.       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  RUL nodular fibrosis          Cardiovascular       Recent diagnostic studies:          echocardiogram and stress test      Exercise tolerance: <4 METS       Beta Blocker therapy: Yes      Beta blockers within 24 hours: Yes      Hypertension          Past MI non-STEMI    Coronary artery disease      Coronary artery bypass graft (1999)          Hyperlipidemia      Dyspnea on exertion      From most recent CV clinic visit note 12/2022:  Chronic HFpEF - managed with diuretics, patient is unsure whether she takes furosemide or torsemide  ?A 2D echo Doppler study performed by [facility] on 12/06/2022-normal LVEF = 60%, no segmental WMA, no significant outflow gradient across the LVOT, concentric LVH, no significant valvular abnormalities.  4.  Coronary artery disease, status post CABG 1999  ?LIMA to LAD, SVG to OM branch, SVG to PDA  5.  Status post LHC in June 2020-atretic LIMA to diagonal graft, 100% occluded SVG to PDA, patent SVG to OM branch  6.  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      Comments: On 3L NC    Abdominal Findings:       Obese    Neurological Findings:       Alert and oriented x 3    Constitutional findings:       No acute distress      Well-developed Previous Airway Procedure Notes Displaying the 3 most recent records      Date Mask Difficulty Techninque used for succesful ETT placement Blade Type Blade Size View with successful placement Number of attempts    02/25/23 0 - not attempted direct laryngoscopy Macintosh 3 grade IIa - partial view of glottis 1            Patient Lines/Drains/Airways Status       Active Lines:       Name Placement date Placement time Site Days    Temporary GI Tube Nasogastric Nare, right 16 FR 03/02/23  1633  -- 1    Peripheral IV 03/03/23 1528 Right Anterior Forearm 20 G 03/03/23  1528  -- less than 1                  Diagnostic Tests  Hematology:   Lab Results   Component Value Date    HGB 9.5 03/03/2023    HCT 29.6 03/03/2023    PLTCT 302 03/03/2023    WBC 6.5 03/03/2023    NEUT 54 06/02/2009    ANC 2.61 06/02/2009    ALC 1.38 06/02/2009    MONA 10 06/02/2009    AMC 0.48 06/02/2009    EOSA 6 06/02/2009    ABC 0.03 06/02/2009    MCV 94.6 03/03/2023    MCH 30.3 03/03/2023    MCHC 32.0 03/03/2023    MPV 6.9 03/03/2023    RDW 20.8 03/03/2023         General Chemistry:   Lab Results   Component Value Date    NA 139 03/03/2023    K 3.8 03/03/2023    CL 108 03/03/2023    CO2 24 03/03/2023    GAP 7 03/03/2023    BUN 17 03/03/2023    CR 0.87 03/03/2023    GLU 59 03/03/2023    CA 7.7 03/03/2023    ALBUMIN 2.0 03/03/2023    LACTIC 1.5 02/24/2023    OBSCA 1.01 01/28/2023    MG 1.9 03/03/2023    TOTBILI 0.3 03/03/2023    PO4 3.1 03/03/2023      Coagulation:   Lab Results   Component Value Date    PT 13.2 01/28/2023    INR 1.2 01/28/2023       Echo 06/15/22  Left ventricular systolic function is within normal limits.  LVEF 65-70%  Mild concentric left ventricular hypertrophy.  Mild to moderate mitral valve regurgitation.   Mild tricuspid valve regurgitation.   Left pleural effusion.   Estimated peak systolic pulmonary artery pressure is 44 mmHg.       Regadenoson MPI Stress Test 01/01/19  SUMMARY/OPINION:   This study is abnormal with mild ischemia likely in a ramus intermedius territory and also more moderate ischemia in the dominant right coronary artery territory or distal  left circumflex.  The defects retain viability.  Other high risk indicators are not noted.     This exam is compared to the patient's previous myocardial perfusion study dated 2016.     The previous study was also performed with technetium.  The previous exam was interpreted to show attenuation artifact in the inferolateral wall but no statistically significant defect..  When the 2 studies are compared directly the defect seen on the current exam are now statistically significant and in similar vascular territories.  Overall however the study is not high risk.      Cardiac Cath Report 01/29/19  FINAL IMPRESSION:    Severe disease of the mid to distal dominant left circumflex.  Atretic left internal mammary artery to the diagonal graft, 100% occluded vein graft to the left posterior descending artery.  Patent vein graft to obtuse marginal 1.  Successful percutaneous coronary intervention of the native mid to distal left circumflex with Xience 3.0 x 28 mm and Xience Sierra 2.5 x 38 mm drug-eluting stent placed in an overlapping fashion, postdilated with 3.0 mm noncompliant balloon.  High normal left ventricular end-diastolic pressure.     RECOMMENDATIONS:    Routine post cardiac cath care.  Aspirin and Plavix for at least 6-12 months, followed by aspirin thereafter.  Aggressive lifestyle and risk factor modification.  Optimal medical therapy.  Cardiac rehab evaluation.    PAC Plan    Anesthesia Plan    ASA score: 4   Plan: general, invasive monitoring, epidural for post operative pain and regional for postoperative pain  Special equipment/procedures: Art line  NPO status: acceptable      Informed Consent  Anesthetic plan and risks discussed with patient.  Use of blood products discussed with patient  Blood Consent: consented      Plan discussed with: resident.      Alerts

## 2023-03-03 NOTE — Unmapped
 Brief Operative Note    Name: Amy Bowman is a 82 y.o. female     DOB: [birth year]             MRN#: [id number]  DATE OF OPERATION: 03/03/2023    Date:  03/03/2023        Preoperative Dx:   Dehiscence of closure of fascia, superficial or muscular, subsequent encounter [T81.32XD]    Post-op Diagnosis      * Dehiscence of closure of fascia, superficial or muscular, subsequent encounter [T81.32XD]    Procedure(s) (LRB):  REPAIR ABDOMINAL WALL EVISCERATION/ DEHISCENCE    Surgeons and Role:     * Dawna Etienne, MD - Primary     * Harlon Light, MD - Resident - Assisting    Surgical site infection present at time of surgery?  No        Findings:  fascial dehiscence with loop of bowel through fascia; fascia reclosed     Estimated Blood Loss: No blood loss documented.     Specimen(s) Removed/Disposition: * No specimens in log *    Complications:  None    Implants: * No implants in log *    Drains: Details on drains available on LDA report    Disposition:  PACU - stable    Jolyne Needs, MD  Pager

## 2023-03-03 NOTE — Progress Notes
 Daily Progress Note      Today's Date:  03/03/2023  Name:  Amy Bowman                       MRN:  [id number]   Admission Date: 02/24/2023 (LOS: 7 days)                     Assessment: Amy Bowman is an 82 year old female with PMH of COPD, chronic respiratory failure (baseline O2 3 lpm), previous tobacco abuse, CAD s/p CABG, HTN, HLD, HFpEF, DM2 recent ventral hernia with secondary small bowel obstruction s/p ex lap with ventral hernia repair (4/24) now with recurrent SBO s/p ex-lap, SB resection, repair of serosal tear, and LOA (7/7).      Plan:  - Ortho consulted for hip pain - NMB LLE;   - Concern for fascial dehiscence, will plan for OR today for exploration   - NPO   - Maintain NGT   - PPN   - Consult geriatrics for chronic medical conditions  - BID PPI  - MMPC  - PTA meds as appropriate  - DVT ppx     Discussed with Dr. Burt Casco  _____________________________________________________________________________    Subjective:  No acute events overnight. Patient very anxious. Having bowel function this morning. No nausea. Feeling better.    Objective:  BP: (96-129)/(41-70)   Temp:  [36.4 ?C (97.5 ?F)-36.6 ?C (97.9 ?F)]   Pulse:  [79-96]   Respirations:  [15 PER MINUTE-18 PER MINUTE]   SpO2:  [93 %-99 %]   O2 Device: Nasal cannula  O2 Liter Flow: 1 Lpm  Body mass index is 43.73 kg/m?Amy Bowman    Lab Results   Component Value Date/Time    HGB 9.5 (L) 03/03/2023 06:10 AM    HCT 29.6 (L) 03/03/2023 06:10 AM    WBC 6.5 03/03/2023 06:10 AM    PLTCT 302 03/03/2023 06:10 AM    INR 1.2 01/28/2023 04:49 PM     Lab Results   Component Value Date/Time    NA 139 03/03/2023 06:10 AM    K 3.8 03/03/2023 06:10 AM    CL 108 03/03/2023 06:10 AM    CO2 24 03/03/2023 06:10 AM    BUN 17 03/03/2023 06:10 AM    CR 0.87 03/03/2023 06:10 AM    MG 1.9 03/03/2023 06:10 AM    PO4 3.1 03/03/2023 06:10 AM    CA 7.7 (L) 03/03/2023 06:10 AM     Lab Results   Component Value Date/Time    GLUPOC 74 03/03/2023 04:14 PM    GLUPOC 108 (H) 03/03/2023 01:00 PM GLUPOC 66 (L) 03/03/2023 11:56 AM          Physical Exam  Constitutional:       Appearance: Normal appearance.   HENT:      Head: Normocephalic and atraumatic.      Nose: Nose normal.   Pulmonary:      Effort: Pulmonary effort is normal.   Abdominal:      General: There is mild distention      Palpations: Abdomen is soft.      Tenderness: appropriate TTP     Comments: Well healed surgical scars   Musculoskeletal:         General: Normal range of motion.      Cervical back: Normal range of motion.   Skin:     General: Skin is warm and dry.   Neurological:  General: No focal deficit present.      Mental Status: She is alert and oriented to person, place, and time.   Psychiatric:         Mood and Affect: Mood normal.         Behavior: Behavior normal.     ICD-10 code E44: Acute illness/Moderate non-severe malnutrition  Energy Intake: Less than 75% of estimated energy requirement for greater than 7days, Mild loss of muscle mass, Mild fluid accumulation        Loss of Subcutaneous Fat: Yes Mild Orbital  Muscle Wasting: Yes Mild Temple  Edema: Yes Mild Lower extremities  Malnutrition Interventions: Initiation of nutrition support    Active Wounds          Wounds Moisture associated skin damage Anterior;Left;Proximal Thigh (Active)   02/24/23 1400   Wound Type: Moisture associated skin damage   Orientation: Anterior;Left;Proximal   Location: Thigh   Wound Location Comments:    Initial Wound Site Closure:    Initial Dressing Placed:    Initial Cycle:    Initial Suction Setting (mmHg):    Pressure Injury Stages:    Pressure Injury Present Within 24 Hours of Hospital Admission:    If This Pressure Injury Is Suspected to Be Device Related, Please Select the Device::    Is the Wound Open or Closed:    Wound Assessment Moist;Pink 03/03/23 0810   Peri-wound Assessment Intact 03/03/23 0810   Wound Drainage Amount None 03/03/23 0810   Wound Dressing Status None/open to air 03/03/23 0810   Wound Care Dressing changed or new application 03/01/23 0515   Wound Dressing and/or Treatment A & D ointment 03/03/23 0810   Number of days: 7       Wounds Surgical incision Umbilicus (Active)   02/25/23 1318   Wound Type: Surgical incision   Orientation:    Location: Umbilicus   Wound Location Comments:    Initial Wound Site Closure: Staples   Initial Dressing Placed: Gauze;Hypafix tape   Initial Cycle:    Initial Suction Setting (mmHg):    Pressure Injury Stages:    Pressure Injury Present Within 24 Hours of Hospital Admission:    If This Pressure Injury Is Suspected to Be Device Related, Please Select the Device::    Is the Wound Open or Closed:    Wound Assessment Dressing not removed for assessment 03/03/23 0810   Wound Site Closure Staples 03/03/23 0810   Peri-wound Assessment Dry;Intact 03/03/23 0810   Wound Drainage Amount None 03/03/23 0810   Wound Drainage Description Serous 03/03/23 0400   Wound Dressing Status Intact 03/03/23 0810   Wound Care Dressing changed or new application 03/03/23 0400   Wound Dressing and/or Treatment Dry gauze;Abdominal pad;Hypafix tape 03/03/23 0810   Number of days: 6                                              ___________________________    Jolyne Needs, MD

## 2023-03-03 NOTE — Care Plan
 Problem: Discharge Planning  Goal: Participation in plan of care  Outcome: Goal Ongoing  Goal: Knowledge regarding plan of care  Outcome: Goal Ongoing  Goal: Prepared for discharge  Outcome: Goal Ongoing     Problem: Harm to self, low risk of suicide  Goal: Absence of harm to self, low risk of suicide  Outcome: Goal Ongoing     Problem: Moderate Fall Risk  Goal: Moderate Fall Risk  Outcome: Goal Ongoing     Problem: Anxiety  Goal: Alleviation of anxiety  Outcome: Goal Ongoing     Problem: Pain  Goal: Management of pain  Outcome: Goal Ongoing  Goal: Knowledge of pain management  Outcome: Goal Ongoing     Problem: Skin Integrity  Goal: Healing of skin (Wound & Incision)  Outcome: Goal Ongoing  Goal: Skin integrity intact  Outcome: Goal Ongoing  Goal: Healing of skin (Pressure Injury)  Outcome: Goal Ongoing     Problem: Nutrition Deficit  Goal: Adequate nutritional intake  Outcome: Goal Ongoing

## 2023-03-03 NOTE — Anesthesia Procedure Notes
Procedure: Airway Placement    AIRWAY INSERTION    Date/Time: 03/03/2023 5:59 PM    Patient location: OR  Urgency: elective  Difficult Airway: No            Airway Procedure  Indication(s) for airway management: surgery        Rapid Sequence Induction (RSI) used: yes    Preoxygenated: yes  Patient position: sniffing  Neck stabilization: in-line stabilization    Mask difficulty assessment: 0 - not attempted      Procedure Outcome  Final airway type: endotracheal airway  Endotracheal airway: ETT          ETT size (mm): 7.0  Technique used for successful ETT placement: direct laryngoscopy  Devices/methods used in placement: intubating stylet  Insertion site: oral  Blade type: Macintosh   Laryngoscope/Videolaryngoscope blade size: 3  Cormack-Lehane classification: grade IIb - view of arytenoids or posterior of glottis only      Measured from: teeth   Depth: 21 cm  Amount of Air in Cuff: 8 ml  Number of attempts at approach: 1  Placement verified by auscultation and capnometry            Complications  Cardiovascular:   Pulmonary:   Procedure: airway not difficult  Medication:   Additional notes: Experienced slight resistance at glottic opening, tube rotated and able to be passed through cords, recommend 6.5 ETT next OR      Performed by: Fernande Bras, MD  Authorized by: Fernande Bras, MD

## 2023-03-03 NOTE — Progress Notes
 THERAPY  NOTE      Name: Amy Bowman   MRN: [id number]     DOB: 1941-03-14      Age: 82 y.o.  Admission Date: 02/24/2023     LOS: 7 days     Date of Service: 03/03/2023      Patient declined to participate to PT and OT at time of visit.  Is awaiting further surgery planned for later today. Rehabilitation services will continue to follow and provide intervention as indicated.    Therapist: Helene Loader, PT  Date: 03/03/2023

## 2023-03-03 NOTE — Consults
 Nutrition Support Service (NSS) Initial Consult Note    On weekends/holidays, no action needs to be taken, if no changes are desired.  For PN changes/questions, NSS is available on Voalte or by pager [phone] for assistance.    Name: Amy Bowman   MRN: [id number]     DOB: [birth year]      Age: 82 y.o.  Admission Date: 02/24/2023     LOS: 7 days     Date of Service: 03/03/2023    Service / Attending: Surgery- Emergency General [id number] / Alanna Hu, MD    Recommendations:  Please adjust IVFs or set a stop time to account for PPN volume starting at 20:30 this evening.     Pertinent Diagnosis: possible ileus vs early post-op SBO   PN Indication: Non-functioning or insufficient profusion to GI tract (known or expected)   PN Line: peripheral  Estimated PN therapy end date: days, once Pt has return of bowl functions and is able to tolerate adequate PO intake     Diet Order: NPO (7/12)    Pertinent allergies/intolerances: none    PMH:   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    HLD (hyperlipidemia)     HTN (hypertension)     Hyperlipemia     Memory difficulties 01/30/2019    has a hard time remembering new information or changes in medication/health care needs    Near-syncope     Obesity 06/02/2009    Personal history of CABG (coronary artery bypass graft) 06/02/2009    Polypharmacy 01/11/2023    S/P hernia surgery 01/11/2023    Small bowel obstruction (HCC) 01/11/2023    Type II diabetes mellitus (HCC)     Vision decreased        PSH:   Surgical History:   Procedure Laterality Date    HIP REPLACEMENT Bilateral 2000    CYST REMOVAL  2011    THORACIC LAMINECTOMY  08/2011    removal of spinal cord tumor    TUMOR REMOVAL  2013    SPINE     ANGIOGRAPHY CORONARY ARTERY WITH LEFT HEART CATHETERIZATION N/A 01/29/2019    Performed by Sandy Crumb, MD at [facility] CATH LAB    POSSIBLE PERCUTANEOUS CORONARY STENT PLACEMENT WITH ANGIOPLASTY N/A 01/29/2019    Performed by Sandy Crumb, MD at [facility] CATH LAB    ESOPHAGOGASTRODUODENOSCOPY WITH BIOPSY - FLEXIBLE N/A 05/30/2022    Performed by Buckles, Tino Foreman, MD at [facility] ENDO    ESOPHAGOGASTRODUODENOSCOPY WITH TRANSENDOSCOPIC BALLOON DILATION - LESS THAN 30 MM DIAMETER  05/30/2022    Performed by Buckles, Tino Foreman, MD at [facility] ENDO    ENTEROLYSIS N/A 02/25/2023    Performed by Hunter, Jocelyn E, MD at [facility] OR    ENTERECTOMY RESECTION AND ANASTOMOSIS  02/25/2023    Performed by Hunter, Jocelyn E, MD at [facility] OR    SUTURE SMALL INTESTINE FOR PERFORATED ULCER/ DIVERTICULUM/ WOUND/ INJURY/ RUPTURE - SINGLE PERFORATION  02/25/2023    Performed by Hunter, Jocelyn E, MD at [facility] OR    CESAREAN SECTION      x2    DOPPLER ECHOCARDIOGRAPHY      ELECTROCARDIOGRAM      HX APPENDECTOMY      HX CORONARY ARTERY BYPASS  GRAFT      LEFT HEART CATHETERIZATION      MYOCARDIAL PERFUSION IMG STUDY      SHOULDER SURGERY Right     2011       Meds: Scheduled Meds:acetaminophen (OFIRMEV) 1,000 mg injection 100 mL, 1,000 mg, Intravenous, Q8H  enoxaparin (LOVENOX) syringe 40 mg, 40 mg, Subcutaneous, BID  fluticasone furoate (ARNUITY ELLIPTA) 200 mcg/actuation inhaler 1 puff, 1 puff, Inhalation, QDAY   And  umeclidinium-vilanteroL (ANORO ELLIPTA) 62.5-25 mcg/actuation inhaler 1 puff, 1 puff, Inhalation, QDAY  insulin aspart (U-100) (NOVOLOG FLEXPEN U-100 INSULIN) injection PEN 0-12 Units, 0-12 Units, Subcutaneous, ACHS (22)  methocarbamoL (ROBAXIN) injection 500 mg, 500 mg, Intravenous, Q8H*  metoprolol (LOPRESSOR) injection 2.5 mg, 2.5 mg, Intravenous, Q6H*  pantoprazole (PROTONIX) injection 40 mg, 40 mg, Intravenous, ZOX(09-60)    Continuous Infusions:   Adult Continuous Parenteral Nutrition (PN)      Adult Continuous Parenteral Nutrition (PN)      lactated ringers infusion 100 mL/hr at 03/02/23 2322     PRN and Respiratory Meds:albuterol sulfate Q4H PRN, calcium carbonate Q4H PRN, dextrose 50% (D50) IV PRN, fentaNYL citrate PF Q1H PRN, lidocaine hcl PRN, nalOXone PRN, ondansetron Q6H PRN **OR** ondansetron (ZOFRAN) IV Q6H PRN, phenoL PRN, simethicone Q6H PRN    Electrolyte Treatments:   (7/12) Mg Sulfate 4 gm IV  (7/13) none noted so far today     Pertinent Labs:   Comprehensive Metabolic Profile    Lab Results   Component Value Date/Time    NA 139 03/03/2023 06:10 AM    K 3.8 03/03/2023 06:10 AM    CL 108 03/03/2023 06:10 AM    CO2 24 03/03/2023 06:10 AM    GAP 7 03/03/2023 06:10 AM    BUN 17 03/03/2023 06:10 AM    CR 0.87 03/03/2023 06:10 AM    GLU 59 (L) 03/03/2023 06:10 AM    Lab Results   Component Value Date/Time    CA 7.7 (L) 03/03/2023 06:10 AM    PO4 3.1 03/03/2023 06:10 AM    ALBUMIN 2.0 (L) 03/03/2023 06:10 AM    TOTPROT 4.3 (L) 03/03/2023 06:10 AM    ALKPHOS 107 03/03/2023 06:10 AM    AST 12 03/03/2023 06:10 AM    ALT 8 03/03/2023 06:10 AM    TOTBILI 0.3 03/03/2023 06:10 AM    GFR 67.4 12/26/2022 12:00 AM    GFRAA >60 01/30/2019 03:40 AM        Lab Results   Component Value Date    MG 1.9 03/03/2023     Lab Results   Component Value Date    TRIG 69 03/03/2023     Lab Results   Component Value Date    GLUPOC 106 (H) 03/03/2023    GLUPOC 61 (L) 03/03/2023    GLUPOC 77 03/03/2023    GLUPOC 83 03/02/2023    GLUPOC 92 03/02/2023       Height (cm): Height: 149.9 cm (4' 11)  Admit Weight (kg): Weight: 96.3 kg (212 lb 4.9 oz)  Current Weight (kg): Weight: 98.2 kg (216 lb 7.9 oz)  Desired Body Weight (kg): 56 kg (bmi 24.9)  Body mass index is 43.73 kg/m?Amy Bowman     Estimated Kcal Needs: 1400-1680 (25-30 kcal/kg DBW 56 kg)   Estimated Protein Needs: 67-84 (1.2-1.5 g/kg DBW 56 kg)     Malnutrition Assessment:  Malnutrition Details:  Malnutrition present  ICD-10 code E44: Acute illness/Moderate non-severe malnutrition  Energy Intake: Less than 75% of estimated energy requirement  for greater than 7days, Mild loss of muscle mass, Mild fluid accumulation            Loss of Subcutaneous Fat: Yes Mild Orbital  Muscle Wasting: Yes Mild Temple  Edema: Yes Mild Lower extremities        Malnutrition Interventions: Initiation of nutrition support  Pressure Injuries: NONE    Assessment / PN Indication:   Amy Bowman is an 82 year old female with PMH of COPD, chronic respiratory failure (baseline O2 3 lpm), previous tobacco abuse, CAD s/p CABG, HTN, HLD, HFpEF, DM2 recent ventral hernia with secondary small bowel obstruction s/p ex lap with ventral hernia repair (4/24) now with recurrent SBO s/p ex-lap, SB resection, repair of serosal tear, and LOA (7/7).   Pt seen by fellow RD 7/12. She reported not eating well after surgery and is now NPO.  Good appetite and po intake prior to admission report.  Patient stated that she thought she was doing pretty well with eating before hospitalization.    Pt attempted Regular diet post-op, yet developed abdominal pain and drainage from midline 7/12. CT showed possible ileus vs early post-op SBO; NG tube placed ( out over the past 24 hours). NSS consulted to start TPN after 1pm deadline 7/12. VAT consulted for PICC line placement, yet Pt has history of bilateral shoulder surgeries and cannot position her arms out far enough for PICC plaecment. Patient will need to go to IR for placement of a tunneled CVC.  Noted Pt with 3 liquid BMs overnight. Paged primary team to ask if they would rather have PPN tonight, and they agreed.    Will provide partial nutrition via peripheral PN.     PN Order (to start at 20:30):  Nonstandard PPN: 75ml/hr x 24hr (1800 ml total volume)    Macronutrients: AA  65g , Dex 120 g,  Clinolipids 150 ml     Lytes: Na 135 mEq (1/2 NS), K 40 mEq, Ca 10 mEq, Mg 16 mEq, Phos 14 mmol, 3:1 [id number]  Additives: 10 ml multivitamin, 1ml trace elements, and 100mg  thiamine    Starting PPN to provide 968 kcals (17 kcals/kg; 69% estimated needs), 65 g AA (1.2 g/kg, 97% estimated needs)    Jay Meth MS, RD, LD, CNSC  Available on [facility] Me   Office [phone]

## 2023-03-04 ENCOUNTER — Encounter: Admit: 2023-03-04 | Discharge: 2023-03-03 | Payer: MEDICARE

## 2023-03-04 ENCOUNTER — Inpatient Hospital Stay: Admit: 2023-03-04 | Discharge: 2023-03-04 | Payer: MEDICARE

## 2023-03-04 LAB — COMPREHENSIVE METABOLIC PANEL CELLULAR THERAPEUTICS
ALBUMIN: 2.1 g/dL — ABNORMAL LOW (ref 3.5–5.0)
ALK PHOSPHATASE: 117 U/L — ABNORMAL HIGH (ref 25–110)
ALT: 11 U/L (ref 7–56)
ANION GAP: 6 (ref 3–12)
AST: 16 U/L (ref 7–40)
BLD UREA NITROGEN: 16 mg/dL (ref 7–25)
CHLORIDE: 108 MMOL/L (ref 98–110)
CO2: 25 MMOL/L (ref 21–30)
CREATININE: 1 mg/dL — ABNORMAL HIGH (ref 0.4–1.00)
EGFR: 55 mL/min — ABNORMAL LOW (ref >60)
GLUCOSE,PANEL: 224 mg/dL — ABNORMAL HIGH (ref 70–100)
POTASSIUM: 4.5 MMOL/L (ref 3.5–5.1)
SODIUM: 139 MMOL/L (ref 137–147)
TOTAL BILIRUBIN: 0.3 mg/dL (ref 0.2–1.3)
TOTAL PROTEIN: 4.8 g/dL — ABNORMAL LOW (ref 6.0–8.0)

## 2023-03-04 LAB — CBC
HEMATOCRIT: 36 % — ABNORMAL HIGH (ref >60)
HEMOGLOBIN: 11 g/dL — ABNORMAL LOW (ref 12.0–15.0)
MCH: 30 pg (ref 26–34)
MCHC: 31 g/dL — ABNORMAL LOW (ref 32.0–36.0)
MCV: 95 FL (ref 80–100)
MPV: 7.4 FL (ref 7–11)
PLATELET COUNT: 211 10*3/uL (ref 150–400)
RBC COUNT: 3.7 M/UL — ABNORMAL LOW (ref 4.0–5.0)
RDW: 21 % — ABNORMAL HIGH (ref 11–15)
WBC COUNT: 11 10*3/uL — ABNORMAL HIGH (ref 4.5–11.0)

## 2023-03-04 LAB — POC GLUCOSE
POC GLUCOSE: 128 mg/dL — ABNORMAL HIGH (ref 70–100)
POC GLUCOSE: 152 mg/dL — ABNORMAL HIGH (ref 70–100)
POC GLUCOSE: 181 mg/dL — ABNORMAL HIGH (ref 70–100)
POC GLUCOSE: 225 mg/dL — ABNORMAL HIGH (ref 70–100)
POC GLUCOSE: 80 mg/dL (ref 70–100)

## 2023-03-04 LAB — TRIGLYCERIDE: TRIGLYCERIDES: 68 mg/dL — ABNORMAL LOW (ref <150)

## 2023-03-04 MED ORDER — HYDROXYZINE HCL 25 MG PO TAB
50 mg | Freq: Three times a day (TID) | NASOGASTRIC | 0 refills | Status: DC | PRN
Start: 2023-03-04 — End: 2023-03-06
  Administered 2023-03-05 – 2023-03-06 (×2): 50 mg via NASOGASTRIC

## 2023-03-04 NOTE — Progress Notes
 OCCUPATIONAL THERAPY  RE-ASSESSMENT NOTE      Name: Amy Bowman   MRN: [id number]     DOB: 25-Feb-1941      Age: 82 y.o.  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 and associated posterior column and inferior pubic rami fractures which occurred after a GLF 6 months ago;  s/p repair of abdominal wall 7/13  Special Considerations: Home oxygen requirement (3L baseline)  Mental / Cognitive: Alert;Cooperative;Confused  Pain: Complains of pain  Pain Location: Post-surgical;Abdomen  Pain Interventions: Nursing staff notified of patient's pain level;Nurse provides pain medications;Patient agrees to participate in therapy with current pain level;Patient assisted into position of comfort  Persons Present: Spouse;Physical Therapist    Home Living Situation  Lives With: Spouse/significant other  Type of Home: House  Entry Stairs: No stairs  In-Home Stairs: Able to live on main level  Bathroom Setup: Walk in shower (grab bars by toilet)  Patient Owned Equipment: Environmental consultant with wheels;Bathing: Shower chair (scooter and lift chair that patient sleeps in)  Comments: Patient reports one of her bathrooms is wide enough for her scooter to fit in, but the other one is not. Patient needs continued reinforcement that she is LLE WBAT for transfers only and will not be able to ambulate into her bathroom at home.    Prior Level of Function  Level Of Independence: Assist needed for IADL;Assist needed for mobility related ADL (husband assists with LE dressing as needed, pt typically wears gowns)  Comments: Pt primarily uses scooter for in-home mobility.  Takes scooter to doorway of bathroom, then able to use RW to ambulate short distance into bathroom.  Pt sleeps in lift chair.  Baseline mobility limited by LLE pain, indicates she needs orthopedic surgery in the future.  Required Assist For: All home functioning ADL;In and out of house (in/out of car - husband assists)    Precautions  L LE Precautions: LLE weight bearing for transfers only    ADL's  Where Assessed: Edge of Bed  Eating Deficits: NPO, but hand to mouth WNL  LE Dressing Assist: Maximum Assist  LE Dressing Deficits: Don/Doff R Sock;Don/Doff L Sock    ADL Mobility  Bed Mobility: Supine to Sit: Moderate assist  Transfer Type: Stand pivot  Transfer: Assistance Level: Minimal assist;Bed;To;Bedside chair  Transfer: Assistive Device: None  Other Transfer Type: Sit to stand  Other Transfer: Assistance Level: Moderate assist;From;Bedside chair  Other Transfer: Assistive Device: Hand hold assist  End of Activity Status: Up in chair;Instructed patient to request assist with mobility;Instructed patient to use call light;Nursing notified    Cognition  Overall Cognitive Status: Confused  Cognition Comment: Reminded pt multiple times of her LLE WB restrictions, poor carryover observed.    ROM  ROM Comments: L torn rotator cuff.    Education  Persons Educated: Patient/Family  Teaching Methods: Verbal Instruction  Patient Response: Verbalized Understanding  Topics: Role of OT, Goals for Therapy  Goal Formulation: With  Patient/Family    Assessment  Assessment: Decreased Endurance;Decreased Self-Care Trans;Decreased ADL Status  Prognosis: Good    AM-PAC 6 Clicks Daily Activity Inpatient  Putting on and taking off regular lower body clothes: A Lot  Bathing (Including washing, rinsing, drying): A Lot  Toileting, which includes using toilet, bedpan, or urinal: A Lot  Putting on and taking off regular upper body clothing: A Little  Taking care of personal grooming such as brushing teeth: None  Eating meals: None  Daily Activity Raw Score: 17  Standardized (T-scale) Score: 37.26    Plan  OT Frequency: 3-5x/week  OT Plan for Next Visit: stand/pivot transfers; toileting on commode    ADL Goals  Patient Will Perform All ADL's: w/ Stand By Assist    Functional Transfer Goals  Pt Will Perform All Functional Transfers: w/ Stand By Assist  Pt Will Transfer To Bedside Commode: w/ Stand By Assist    OT Discharge Recommendations  Recommendation: Home with consistent supervision/assistance  Recommendation for Therapy Post Discharge: Home health  Patient Currently Requires Equipment: Commode: standard  Comments: If spouse is unable to provide physical assist for all mobility and MRADLs, patient will need inpatient setting.    Therapist: Arrie Bienenstock, OTR/L [id number]  Date: 03/04/2023

## 2023-03-04 NOTE — Anesthesia Procedure Notes
Anesthesia Procedure: Peripheral Nerve Block    PERIPHERAL NERVE BLOCK    Date/Time: 03/03/2023 7:32 PM    Patient location: post-op  Reason for block: post-op pain management    Preprocedure checklist performed: 2 patient identifiers, risks & benefits discussed, patient evaluated, timeout performed, consent obtained, patient being monitored and sterile drape    Sterile technique:  - Proper hand washing  - Cap, mask  - Sterile gloves  - Skin prep for antisepsis        Peripheral Nerve Block Procedure   Patient position: supine  Prep: ChloraPrep    Monitoring: BP, EKG and continuous pulse ox  Block type: rectus sheath  Laterality: bilateral  Injection technique: single-shot  Procedures: ultrasound guided    Ultrasound image captured    Needle/cathether:        Needle gauge: 22 G; Needle length: 3.5 in     Needle location: anatomical landmarks and ultrasound guidance    Procedure Medications    Bolus Dose: ropivacaine (PF) 0.2% (NAROPIN) injection - Block   60 mL - 03/03/2023 7:32:00 PM    Procedure Outcome   Injection assessment: negative aspiration for heme, no paresthesia on injection, incremental injection and local visualized surrounding nerve on ultrasound  Observations: patient tolerated the procedure well with no immediate complications    Additional notes: B/l rectus sheath block under [place]. 30ml on each side    Refer to nursing documentation for vitals and monitoring data during procedure.    Performed by: Fernande Bras, MD  Authorized by: Fernande Bras, MD

## 2023-03-04 NOTE — Progress Notes
 Daily Progress Note      Today's Date:  03/04/2023  Name:  Amy Bowman                       MRN:  [id number]   Admission Date: 02/24/2023 (LOS: 8 days)                     Assessment: Amy Bowman is an 82 year old female with PMH of COPD, chronic respiratory failure (baseline O2 3 lpm), previous tobacco abuse, CAD s/p CABG, HTN, HLD, HFpEF, DM2 recent ventral hernia with secondary small bowel obstruction s/p ex lap with ventral hernia repair (4/24) now with recurrent SBO s/p ex-lap, SB resection, repair of serosal tear, and LOA (7/7) s/p re-exlap w/ fascial closure.      Plan:  - Ortho consulted for hip pain - NMB LLE  - NPO   - Maintain NGT   - PPN   - Consult geriatrics for chronic medical conditions  - BID PPI  - MMPC  - PTA meds as appropriate  - DVT ppx     Discussed with Dr. Mariann Shires  _____________________________________________________________________________    Subjective:  No acute events overnight. Patient very anxious. Having increased pain. Denies N/V. AROBF  Objective:  BP: (90-148)/(50-96)   Temp:  [36.3 ?C (97.3 ?F)-37.1 ?C (98.8 ?F)]   Pulse:  [83-112]   Respirations:  [14 PER MINUTE-22 PER MINUTE]   SpO2:  [94 %-100 %]   O2 Device: Nasal cannula  O2 Liter Flow: 2 Lpm  Body mass index is 43.73 kg/m?Amy Bowman    Lab Results   Component Value Date/Time    HGB 11.4 (L) 03/04/2023 02:31 AM    HCT 36.3 03/04/2023 02:31 AM    WBC 11.7 (H) 03/04/2023 02:31 AM    PLTCT 211 03/04/2023 02:31 AM    INR 1.2 01/28/2023 04:49 PM     Lab Results   Component Value Date/Time    NA 139 03/04/2023 06:08 AM    K 4.5 03/04/2023 06:08 AM    CL 108 03/04/2023 06:08 AM    CO2 25 03/04/2023 06:08 AM    BUN 16 03/04/2023 06:08 AM    CR 1.02 (H) 03/04/2023 06:08 AM    MG 1.9 03/04/2023 06:08 AM    PO4 4.1 03/04/2023 06:08 AM    CA 8.0 (L) 03/04/2023 06:08 AM     Lab Results   Component Value Date/Time    GLUPOC 152 (H) 03/04/2023 11:55 AM    GLUPOC 225 (H) 03/04/2023 07:56 AM    GLUPOC 128 (H) 03/03/2023 10:23 PM          Physical Exam  Constitutional:       Appearance: Normal appearance.   HENT:      Head: Normocephalic and atraumatic.      Nose: Nose normal.   Pulmonary:      Effort: Pulmonary effort is normal.   Abdominal:      General: There is mild distention      Palpations: Abdomen is soft.      Tenderness: appropriate TTP     Comments: Well healed surgical scars   Musculoskeletal:         General: Normal range of motion.      Cervical back: Normal range of motion.   Skin:     General: Skin is warm and dry.   Neurological:      General: No focal deficit present.  Mental Status: She is alert and oriented to person, place, and time.   Psychiatric:         Mood and Affect: Mood normal.         Behavior: Behavior normal.     ICD-10 code E44: Acute illness/Moderate non-severe malnutrition  Energy Intake: Less than 75% of estimated energy requirement for greater than 7days, Mild loss of muscle mass, Mild fluid accumulation        Loss of Subcutaneous Fat: Yes Mild Orbital  Muscle Wasting: Yes Mild Temple  Edema: Yes Mild Lower extremities  Malnutrition Interventions: Initiation of nutrition support    Active Wounds          Wounds Moisture associated skin damage Anterior;Left;Proximal Thigh (Active)   02/24/23 1400   Wound Type: Moisture associated skin damage   Orientation: Anterior;Left;Proximal   Location: Thigh   Wound Location Comments:    Initial Wound Site Closure:    Initial Dressing Placed:    Initial Cycle:    Initial Suction Setting (mmHg):    Pressure Injury Stages:    Pressure Injury Present Within 24 Hours of Hospital Admission:    If This Pressure Injury Is Suspected to Be Device Related, Please Select the Device::    Is the Wound Open or Closed:    Wound Assessment Moist;Pink 03/04/23 0832   Peri-wound Assessment Intact 03/04/23 0832   Wound Drainage Amount None 03/04/23 0832   Wound Dressing Status None/open to air 03/04/23 0832   Wound Care Dressing changed or new application 03/01/23 0515   Wound Dressing and/or Treatment A & D ointment 03/04/23 1610   Number of days: 8       Wounds Surgical incision Umbilicus (Active)   02/25/23 1318   Wound Type: Surgical incision   Orientation:    Location: Umbilicus   Wound Location Comments:    Initial Wound Site Closure: Staples   Initial Dressing Placed: Gauze;Hypafix tape   Initial Cycle:    Initial Suction Setting (mmHg):    Pressure Injury Stages:    Pressure Injury Present Within 24 Hours of Hospital Admission:    If This Pressure Injury Is Suspected to Be Device Related, Please Select the Device::    Is the Wound Open or Closed:    Wound Assessment Dressing not removed for assessment 03/04/23 0832   Wound Site Closure Staples 03/04/23 0832   Peri-wound Assessment Dry;Intact 03/04/23 0832   Wound Drainage Amount Small 03/04/23 0832   Wound Drainage Description Serosanguineous 03/04/23 0832   Wound Dressing Status Intact 03/04/23 0832   Wound Care Dressing reinforced 03/04/23 0024   Wound Dressing and/or Treatment Dry gauze;Hypafix tape;A & D ointment 03/04/23 0832   Number of days: 7                                              ___________________________    Evon Hoguet, MD

## 2023-03-04 NOTE — Anesthesia Post-Procedure Evaluation
Post-Anesthesia Evaluation    Name: Amy Bowman      MRN: [id number]     DOB: 11/14/1940     Age: 82 y.o.     Sex: female   __________________________________________________________________________     Procedure Information       Anesthesia Start Date/Time: 03/03/23 1748    Procedure: REPAIR ABDOMINAL WALL EVISCERATION/ DEHISCENCE (Abdomen) - BILLING AREA: EGS    Location: MAIN OR 11 / Main OR/Periop    Surgeons: Robbi Garter, MD            Post-Anesthesia Vitals  BP: 120/63 (07/13 2108)  Temp: 36.3 ?C (97.3 ?F) (07/13 2108)  Pulse: 102 (07/13 2108)  Respirations: 22 PER MINUTE (07/13 2108)  SpO2: 100 % (07/13 2108)  O2 Device: Nasal cannula (07/13 2108)   Vitals Value Taken Time   BP 106/63 03/03/23 2000   Temp 37.1 ?C (98.8 ?F) 03/03/23 2000   Pulse 104 03/03/23 2000   Respirations 14 PER MINUTE 03/03/23 2000   SpO2 98 % 03/03/23 2000   O2 Device Nasal cannula 03/03/23 2000   ABP     ART BP           Post Anesthesia Evaluation Note    Evaluation location: Pre/Post  Patient participation: recovered; patient participated in evaluation  Level of consciousness: alert  Pain management: adequate    Hydration: normovolemia  Temperature: 36.0?C - 38.4?C  Airway patency: adequate    Regional/Neuraxial:       Neurological status: sensory deficit (single shot rectus sheath performed in PACU)      Single injection shot performed    Perioperative Events       Post-op nausea and vomiting: no PONV    Postoperative Status  Cardiovascular status: hemodynamically stable  Respiratory status: spontaneous ventilation  Follow-up needed: none        Perioperative Events  There were no known complications for this encounter.

## 2023-03-04 NOTE — Care Plan
 Problem: Discharge Planning  Goal: Participation in plan of care  Outcome: Goal Ongoing  Goal: Knowledge regarding plan of care  Outcome: Goal Ongoing  Goal: Prepared for discharge  Outcome: Goal Ongoing     Problem: Harm to self, low risk of suicide  Goal: Absence of harm to self, low risk of suicide  Outcome: Goal Ongoing     Problem: Moderate Fall Risk  Goal: Moderate Fall Risk  Outcome: Goal Ongoing     Problem: Anxiety  Goal: Alleviation of anxiety  Outcome: Goal Ongoing     Problem: Pain  Goal: Management of pain  Outcome: Goal Ongoing  Goal: Knowledge of pain management  Outcome: Goal Ongoing     Problem: Skin Integrity  Goal: Healing of skin (Wound & Incision)  Outcome: Goal Ongoing  Goal: Skin integrity intact  Outcome: Goal Ongoing  Goal: Healing of skin (Pressure Injury)  Outcome: Goal Ongoing     Problem: Nutrition Deficit  Goal: Adequate nutritional intake  Outcome: Goal Ongoing

## 2023-03-05 ENCOUNTER — Encounter: Admit: 2023-03-05 | Discharge: 2023-03-05 | Payer: MEDICARE

## 2023-03-05 ENCOUNTER — Inpatient Hospital Stay: Admit: 2023-03-05 | Discharge: 2023-03-05 | Payer: MEDICARE

## 2023-03-05 DIAGNOSIS — Z79899 Other long term (current) drug therapy: Secondary | ICD-10-CM

## 2023-03-05 DIAGNOSIS — I10 Essential (primary) hypertension: Secondary | ICD-10-CM

## 2023-03-05 DIAGNOSIS — R413 Other amnesia: Secondary | ICD-10-CM

## 2023-03-05 DIAGNOSIS — M549 Dorsalgia, unspecified: Secondary | ICD-10-CM

## 2023-03-05 DIAGNOSIS — J439 Emphysema, unspecified: Secondary | ICD-10-CM

## 2023-03-05 DIAGNOSIS — J45909 Unspecified asthma, uncomplicated: Secondary | ICD-10-CM

## 2023-03-05 DIAGNOSIS — K56609 Unspecified intestinal obstruction, unspecified as to partial versus complete obstruction: Secondary | ICD-10-CM

## 2023-03-05 DIAGNOSIS — K439 Ventral hernia without obstruction or gangrene: Secondary | ICD-10-CM

## 2023-03-05 DIAGNOSIS — E119 Type 2 diabetes mellitus without complications: Secondary | ICD-10-CM

## 2023-03-05 DIAGNOSIS — H547 Unspecified visual loss: Secondary | ICD-10-CM

## 2023-03-05 DIAGNOSIS — E669 Obesity, unspecified: Secondary | ICD-10-CM

## 2023-03-05 DIAGNOSIS — M1990 Unspecified osteoarthritis, unspecified site: Secondary | ICD-10-CM

## 2023-03-05 DIAGNOSIS — I2510 Atherosclerotic heart disease of native coronary artery without angina pectoris: Secondary | ICD-10-CM

## 2023-03-05 DIAGNOSIS — E785 Hyperlipidemia, unspecified: Secondary | ICD-10-CM

## 2023-03-05 DIAGNOSIS — Z9889 Other specified postprocedural states: Secondary | ICD-10-CM

## 2023-03-05 LAB — POC GLUCOSE
POC GLUCOSE: 167 mg/dL — ABNORMAL HIGH (ref 70–100)
POC GLUCOSE: 153 mg/dL — ABNORMAL HIGH (ref 70–100)
POC GLUCOSE: 135 mg/dL — ABNORMAL HIGH (ref 70–100)
POC GLUCOSE: 130 mg/dL — ABNORMAL HIGH (ref 70–100)
POC GLUCOSE: 136 mg/dL — ABNORMAL HIGH (ref 70–100)

## 2023-03-05 LAB — BASIC METABOLIC PANEL
CALCIUM: 8.4 mg/dL — ABNORMAL LOW (ref 8.5–10.6)
CHLORIDE: 108 MMOL/L — ABNORMAL LOW (ref 98–110)
CO2: 23 MMOL/L — ABNORMAL LOW (ref 21–30)
CREATININE: 0.9 mg/dL — ABNORMAL LOW (ref 0.4–1.00)
EGFR: >60 mL/min — ABNORMAL HIGH (ref >60)
POTASSIUM: 4 MMOL/L — ABNORMAL LOW (ref 3.5–5.1)
SODIUM: 138 MMOL/L — ABNORMAL LOW (ref 137–147)

## 2023-03-05 LAB — CBC
HEMATOCRIT: 28 % — ABNORMAL LOW (ref 36–45)
MCV: 94 FL — ABNORMAL HIGH (ref 80–100)
MPV: 7.4 FL (ref 7–11)
PLATELET COUNT: 349 10*3/uL — ABNORMAL LOW (ref 150–400)

## 2023-03-05 LAB — PHOSPHORUS: PHOSPHORUS: 3.1 mg/dL — ABNORMAL LOW (ref 2.0–4.5)

## 2023-03-05 MED ORDER — FUROSEMIDE 10 MG/ML IJ SOLN
20 mg | Freq: Once | INTRAVENOUS | 0 refills | Status: CP
Start: 2023-03-05 — End: ?
  Administered 2023-03-06: 05:00:00 20 mg via INTRAVENOUS

## 2023-03-05 MED ORDER — SENNOSIDES 8.8 MG/5 ML PO SYRP
8.8 mg | Freq: Two times a day (BID) | ORAL | 0 refills | Status: DC
Start: 2023-03-05 — End: 2023-03-08
  Administered 2023-03-06 – 2023-03-08 (×4): 8.8 mg via ORAL

## 2023-03-05 MED ORDER — DOCUSATE SODIUM 50 MG/5 ML PO LIQD
50 mg | Freq: Two times a day (BID) | ORAL | 0 refills | Status: DC
Start: 2023-03-05 — End: 2023-03-08
  Administered 2023-03-06 – 2023-03-08 (×3): 50 mg via ORAL

## 2023-03-05 MED ORDER — ADULT PN CONTINUOUS-ION BASED
INTRAVENOUS | 0 refills | Status: CP
Start: 2023-03-05 — End: ?
  Administered 2023-03-06: 02:00:00 1800.0000 mL via INTRAVENOUS

## 2023-03-05 MED ORDER — MELATONIN 5 MG PO TAB
5 mg | Freq: Every evening | NASOGASTRIC | 0 refills | Status: DC
Start: 2023-03-05 — End: 2023-03-08
  Administered 2023-03-06 – 2023-03-08 (×3): 5 mg via NASOGASTRIC

## 2023-03-05 NOTE — Progress Notes
 RT Adult Assessment Note    NAME:Amy Bowman             MRN: [id number]             DOB:1940/12/03          AGE: 82 y.o.  ADMISSION DATE: 02/24/2023             DAYS ADMITTED: LOS: 9 days    Additional Comments:  Impressions of the patient: No distress noted.  Intervention(s)/outcome(s): See RT treatment plan.  Patient education that was completed: NA  Recommendations to the care team: NA    Vital Signs:  Pulse: 87  RR: 16 PER MINUTE  SpO2: 94 %  O2 Device: Nasal cannula  Liter Flow: 2 Lpm  O2%:      Breath Sounds:   Right Apex Breath Sounds: Clear (Implies normal)  Right Base Breath Sounds: Decreased  Left Apex Breath Sounds: Clear (Implies normal)  Left Base Breath Sounds: Decreased  Respiratory Effort:   Respiratory Effort/Pattern: Unlabored  Comments:

## 2023-03-05 NOTE — Progress Notes
 Nutrition Support Service (NSS) Progress Note      PN therapy start date: 03/03/23  PN Indication: Non-functioning or insufficient profusion to GI tract (known or expected)  Estimated PN therapy end date: days, once Pt has return of bowl functions and is able to tolerate adequate PO intake        Current Peripheral PN Formula:   Nonstandard PPN: 73ml/hr x 24hr (1800 ml total volume)    Macronutrients: AA  65g , Dex 120 g,  Clinolipids 150 ml     Lytes: Na 135 mEq (1/2 NS), K 40 mEq, Ca 10 mEq, Mg 16 mEq, Phos 14 mmol, 3:1 LK:[id number]  Additives: 10 ml multivitamin, 1ml trace elements, and 100mg  thiamine  PPN provides 968 kcals (17 kcals/kg; 69% estimated needs), 65 g AA (1.2 g/kg, 97% estimated needs)     Diet Order: NPO  TF: n/a  Pertinent allergies/intolerances: none pertinent noted    I/O: (incomplete) UOP: NGT: BM -    Meds: Scheduled Meds:acetaminophen (OFIRMEV) 1,000 mg injection 100 mL, 1,000 mg, Intravenous, Q8H  enoxaparin (LOVENOX) syringe 40 mg, 40 mg, Subcutaneous, BID  fluticasone furoate (ARNUITY ELLIPTA) 200 mcg/actuation inhaler 1 puff, 1 puff, Inhalation, QDAY   And  umeclidinium-vilanteroL (ANORO ELLIPTA) 62.5-25 mcg/actuation inhaler 1 puff, 1 puff, Inhalation, QDAY  insulin aspart (U-100) (NOVOLOG FLEXPEN U-100 INSULIN) injection PEN 0-12 Units, 0-12 Units, Subcutaneous, ACHS (22)  methocarbamoL (ROBAXIN) injection 500 mg, 500 mg, Intravenous, Q8H*  metoprolol (LOPRESSOR) injection 2.5 mg, 2.5 mg, Intravenous, Q6H*  pantoprazole (PROTONIX) injection 40 mg, 40 mg, Intravenous, OZD(66-44)    Continuous Infusions:   Adult Continuous Parenteral Nutrition (PN)      Adult Continuous Parenteral Nutrition (PN) 75 mL/hr at 03/04/23 2059    lactated ringers infusion 1,000 mL (03/03/23 1724)     PRN and Respiratory Meds:albuterol sulfate Q4H PRN, calcium carbonate Q4H PRN, dextrose 50% (D50) IV PRN, fentaNYL citrate PF Q1H PRN, hydrOXYzine HCL TID PRN, lidocaine hcl PRN, nalOXone PRN, ondansetron Q6H PRN **OR** ondansetron (ZOFRAN) IV Q6H PRN, phenoL PRN, simethicone Q6H PRN    Electrolyte Treatments: none given 7/14 or so far 7/15    Pertinent Labs:   Comprehensive Metabolic Profile    Lab Results   Component Value Date/Time    NA 138 03/05/2023 06:13 AM    K 4.0 03/05/2023 06:13 AM    CL 108 03/05/2023 06:13 AM    CO2 23 03/05/2023 06:13 AM    GAP 7 03/05/2023 06:13 AM    BUN 19 03/05/2023 06:13 AM    CR 0.91 03/05/2023 06:13 AM    GLU 130 (H) 03/05/2023 06:13 AM    Lab Results   Component Value Date/Time    CA 8.4 (L) 03/05/2023 06:13 AM    PO4 3.1 03/05/2023 06:13 AM    ALBUMIN 2.1 (L) 03/04/2023 06:08 AM    TOTPROT 4.8 (L) 03/04/2023 06:08 AM    ALKPHOS 117 (H) 03/04/2023 06:08 AM    AST 16 03/04/2023 06:08 AM    ALT 11 03/04/2023 06:08 AM    TOTBILI 0.3 03/04/2023 06:08 AM    GFR 67.4 12/26/2022 12:00 AM    GFRAA >60 01/30/2019 03:40 AM        Lab Results   Component Value Date    MG 2.1 03/05/2023     Lab Results   Component Value Date    TRIG 68 03/04/2023     Lab Results   Component Value Date    GLUPOC 135 (  H) 03/05/2023    GLUPOC 153 (H) 03/05/2023    GLUPOC 167 (H) 03/04/2023    GLUPOC 181 (H) 03/04/2023    GLUPOC 152 (H) 03/04/2023       Admit Weight (Kg): Weight: 96.3 kg (212 lb 4.9 oz)   Current Weight (Kg): Weight: 98.2 kg (216 lb 7.9 oz) (7/13 last filed weight)  TPN Dose Weight (Kg): 56kg    Estimated Kcal Needs: 1400-1680 (25-30 kcal/kg DBW 56 kg)   Estimated Protein Needs: 67-84 (1.2-1.5 g/kg DBW 56 kg)        Malnutrition Details:  Malnutrition present  ICD-10 code E44: Acute illness/Moderate non-severe malnutrition  Energy Intake: Less than 75% of estimated energy requirement for greater than 7days, Mild loss of muscle mass, Mild fluid accumulation            Loss of Subcutaneous Fat: Yes Mild Orbital  Muscle Wasting: Yes Mild Temple  Edema: Yes Mild Lower extremities        Malnutrition Interventions: Initiation of nutrition support    A/P: Amy Bowman is an 82 year old female with PMH of COPD, chronic respiratory failure (baseline O2 3 lpm), previous tobacco abuse, CAD s/p CABG, HTN, HLD, HFpEF, DM2 recent ventral hernia with secondary small bowel obstruction s/p ex lap with ventral hernia repair (4/24) now with recurrent SBO s/p ex-lap, SB resection, repair of serosal tear, and LOA (7/7). Pt seen by fellow RD 7/12. She reported not eating well after surgery and is now NPO.  Good appetite and po intake prior to admission report.  Patient stated that she thought she was doing pretty well with eating before hospitalization. Pt attempted Regular diet post-op, yet developed abdominal pain and drainage from midline 7/12. CT showed possible ileus vs early post-op SBO; NG tube placed. NSS consulted to start TPN after 1pm deadline 7/12. VAT consulted for PICC line placement, yet pt has history of bilateral shoulder surgeries and cannot position her arms out far enough for PICC plaecment. Patient would need to go to IR for placement of a tunneled CVC. NSS on-call assisted team with custom PPN start 7/13.       7/15.  Day 3 of PPN. NGT to LIWS, awaiting bowel function return. Additional PIV placed 7/14. Team wishes to continue PN via peripheral access for now. Current bag unchanged from start on 7/13; lytes trends reviewed; has not needed replacements, acceptable levels this AM. No changes needed to current bag except will move slightly higher on acetate salts. Unable to increase kcal/pro within current PPN volume.     PN Changes (to start at 20:30):  Decreased [id number]:WFUXNAT from 3:1 to 2:1    PPN to provide 968 kcals (17 kcals/kg; 69% estimated needs), 65 g AA (1.2 g/kg, 97% estimated needs)       Recommendations:  If anticipating prolonged return of bowel function, would place CVC for PN to be able to provide 100% estimated kcal/pro needs      Thersa Flavors, RD, LD, CNSC   Available on [place] - Office: [phone]

## 2023-03-05 NOTE — Progress Notes
 During assessment patient visibly SOA. Spo2 obtained = 94% on 2.5lpm.   Pt states she is on torsemide at home.    Notified Dr.  Melton Squires - whom came to bedside to assess pt.    New orders for labs, x-ray and EKG.     2302: New orders for furosemide.

## 2023-03-05 NOTE — Progress Notes
 OCCUPATIONAL THERAPY  NOTE   Name: ELLISSA AYO   MRN: [id number]     DOB: [birth year]      Age: 82 y.o.  Admission Date: 02/24/2023     LOS: 9 days     Date of Service: 03/05/2023      Patient declines therapy at this time, states she sat up in the chair earlier and just got back to bed. OT will continue to follow.     Therapist: Arrie Bienenstock, OTR/L [id number]  Date: 03/05/2023

## 2023-03-05 NOTE — Procedures
 Presented bedside for PIV consult.  Patient insist\ent on one last PIV before consideration of central line.  She requested upper cephalic of left arm.  This VAT RN assesses bilateral lower arms and agrees that upper cephalic is the best option.  No labs requested.  Netting placed on left arm per patient's request for securing.  No futher needs at this time.

## 2023-03-05 NOTE — Progress Notes
 Daily Progress Note      Today's Date:  03/05/2023  Name:  Amy Bowman                       MRN:  [id number]   Admission Date: 02/24/2023 (LOS: 9 days)                     Assessment: Amy Bowman is an 82 year old female with PMH of COPD, chronic respiratory failure (baseline O2 3 lpm), previous tobacco abuse, CAD s/p CABG, HTN, HLD, HFpEF, DM2 recent ventral hernia with secondary small bowel obstruction s/p ex lap with ventral hernia repair (4/24) now with recurrent SBO s/p ex-lap, SB resection, repair of serosal tear, and LOA (7/7) s/p re-exlap w/ fascial closure.      Plan:  - Ortho consulted for hip pain - NMB LLE  - NPO   - Maintain NGT   - PPN   - BID PPI  - MMPC  - PTA meds as appropriate  - DVT ppx     Discussed with Dr. Micael Bowman  _____________________________________________________________________________    Subjective:  No acute events overnight. Pain tolerated. Denies N/V. AROBF  Objective:  BP: (90-113)/(50-79)   Temp:  [36.4 ?C (97.5 ?F)-36.7 ?C (98 ?F)]   Pulse:  [86-98]   Respirations:  [14 PER MINUTE-20 PER MINUTE]   SpO2:  [94 %-100 %]   O2 Device: Nasal cannula  O2 Liter Flow: 2 Lpm  Body mass index is 43.73 kg/m?Amy Bowman    Lab Results   Component Value Date/Time    HGB 9.4 (L) 03/05/2023 06:13 AM    HCT 28.8 (L) 03/05/2023 06:13 AM    WBC 10.4 03/05/2023 06:13 AM    PLTCT 349 03/05/2023 06:13 AM    INR 1.2 01/28/2023 04:49 PM     Lab Results   Component Value Date/Time    NA 138 03/05/2023 06:13 AM    K 4.0 03/05/2023 06:13 AM    CL 108 03/05/2023 06:13 AM    CO2 23 03/05/2023 06:13 AM    BUN 19 03/05/2023 06:13 AM    CR 0.91 03/05/2023 06:13 AM    MG 2.1 03/05/2023 06:13 AM    PO4 3.1 03/05/2023 06:13 AM    CA 8.4 (L) 03/05/2023 06:13 AM     Lab Results   Component Value Date/Time    GLUPOC 153 (H) 03/05/2023 03:10 AM    GLUPOC 167 (H) 03/04/2023 09:28 PM    GLUPOC 181 (H) 03/04/2023 04:15 PM          Physical Exam  Constitutional:       Appearance: Normal appearance.   HENT:      Head: Normocephalic and atraumatic.      Nose: Nose normal.   Pulmonary:      Effort: Pulmonary effort is normal.   Abdominal:      General: There is mild distention      Palpations: Abdomen is soft.      Tenderness: appropriate TTP     Comments: Well healed surgical scars   Musculoskeletal:         General: Normal range of motion.      Cervical back: Normal range of motion.   Skin:     General: Skin is warm and dry.   Neurological:      General: No focal deficit present.      Mental Status: She is alert and oriented to person, place, and  time.   Psychiatric:         Mood and Affect: Mood normal.         Behavior: Behavior normal.     ICD-10 code E44: Acute illness/Moderate non-severe malnutrition  Energy Intake: Less than 75% of estimated energy requirement for greater than 7days, Mild loss of muscle mass, Mild fluid accumulation        Loss of Subcutaneous Fat: Yes Mild Orbital  Muscle Wasting: Yes Mild Temple  Edema: Yes Mild Lower extremities  Malnutrition Interventions: Initiation of nutrition support    Active Wounds          Wounds Moisture associated skin damage Anterior;Left;Proximal Thigh (Active)   02/24/23 1400   Wound Type: Moisture associated skin damage   Orientation: Anterior;Left;Proximal   Location: Thigh   Wound Location Comments:    Initial Wound Site Closure:    Initial Dressing Placed:    Initial Cycle:    Initial Suction Setting (mmHg):    Pressure Injury Stages:    Pressure Injury Present Within 24 Hours of Hospital Admission:    If This Pressure Injury Is Suspected to Be Device Related, Please Select the Device::    Is the Wound Open or Closed:    Wound Assessment Moist;Pink 03/04/23 2058   Peri-wound Assessment Intact 03/04/23 2058   Wound Drainage Amount None 03/04/23 2058   Wound Dressing Status None/open to air 03/04/23 2058   Wound Care Dressing changed or new application 03/01/23 0515   Wound Dressing and/or Treatment A & D ointment 03/04/23 2058   Number of days: 9       Wounds Surgical incision Umbilicus (Active)   02/25/23 1318   Wound Type: Surgical incision   Orientation:    Location: Umbilicus   Wound Location Comments:    Initial Wound Site Closure: Staples   Initial Dressing Placed: Gauze;Hypafix tape   Initial Cycle:    Initial Suction Setting (mmHg):    Pressure Injury Stages:    Pressure Injury Present Within 24 Hours of Hospital Admission:    If This Pressure Injury Is Suspected to Be Device Related, Please Select the Device::    Is the Wound Open or Closed:    Wound Assessment Dressing not removed for assessment 03/04/23 2058   Wound Site Closure Staples 03/04/23 2058   Peri-wound Assessment Dry;Intact 03/04/23 2058   Wound Drainage Amount None 03/04/23 2058   Wound Drainage Description Serosanguineous 03/04/23 0832   Wound Dressing Status Intact 03/04/23 2058   Wound Care Dressing reinforced 03/04/23 0024   Wound Dressing and/or Treatment Dry gauze;Hypafix tape;A & D ointment 03/04/23 2058   Number of days: 8                                              ___________________________    Amy Hoguet, MD

## 2023-03-06 ENCOUNTER — Inpatient Hospital Stay: Admit: 2023-03-06 | Discharge: 2023-03-06 | Payer: MEDICARE

## 2023-03-06 LAB — ECG 12-LEAD
P AXIS: 35 degrees — ABNORMAL LOW (ref >60)
P AXIS: 2 degrees
P-R INTERVAL: 248 ms — ABNORMAL HIGH (ref 70–100)
P-R INTERVAL: 264 ms
Q-T INTERVAL: 416 ms (ref 80–100)
Q-T INTERVAL: 384 ms
QRS DURATION: 110 ms — ABNORMAL LOW (ref 7–25)
QRS DURATION: 106 ms
QTC CALCULATION (BAZETT): 497 ms — ABNORMAL LOW (ref 26–34)
QTC CALCULATION (BAZETT): 500 ms
R AXIS: 80 degrees — ABNORMAL HIGH (ref 11–15)
R AXIS: 62 degrees
T AXIS: 142 degrees (ref 150–400)
T AXIS: 142 degrees
VENTRICULAR RATE: 86 {beats}/min — ABNORMAL LOW (ref 3–12)
VENTRICULAR RATE: 102 {beats}/min

## 2023-03-06 LAB — BASIC METABOLIC PANEL
CHLORIDE: 108 MMOL/L (ref 98–110)
CHLORIDE: 107 MMOL/L — ABNORMAL LOW (ref 98–110)
CO2: 23 MMOL/L (ref 21–30)
CREATININE: 0.9 mg/dL (ref 0.4–1.00)
GLUCOSE,PANEL: 158 mg/dL — ABNORMAL HIGH (ref 70–100)
POTASSIUM: 3.9 MMOL/L — ABNORMAL LOW (ref 3.5–5.1)
SODIUM: 139 MMOL/L (ref 137–147)
SODIUM: 138 MMOL/L (ref 137–147)

## 2023-03-06 LAB — BLOOD GASES, ARTERIAL
BASE DEFICIT-ART: 1 MMOL/L
BICARB, ART(CAL): 23 MMOL/L (ref 21–28)
O2 SAT,ART(CALC.): 97 % (ref 95–99)
PCO2-ART: 37 mmHg (ref 35–45)
PH-ART: 7.4 (ref 7.35–7.45)
PO2-ART: 94 mmHg (ref 80–100)

## 2023-03-06 LAB — CBC
HEMATOCRIT: 28 % — ABNORMAL LOW (ref 36–45)
MCH: 31 pg — ABNORMAL LOW (ref 26–34)
MCHC: 32 g/dL — ABNORMAL HIGH (ref >60)
MPV: 7.3 FL (ref 7–11)
MPV: 7.4 FL — ABNORMAL LOW (ref 7–11)
PLATELET COUNT: 357 10*3/uL — ABNORMAL HIGH (ref 150–400)
RBC COUNT: 3 M/UL — ABNORMAL LOW (ref 4.0–5.0)
RDW: 20 % — ABNORMAL HIGH (ref 11–15)
WBC COUNT: 8.4 10*3/uL — ABNORMAL LOW (ref 4.5–11.0)

## 2023-03-06 LAB — POC GLUCOSE
POC GLUCOSE: 142 mg/dL — ABNORMAL HIGH (ref 70–100)
POC GLUCOSE: 135 mg/dL — ABNORMAL HIGH (ref 70–100)

## 2023-03-06 LAB — HIGH SENSITIVITY TROPONIN I, RANDOM
HIGH SENSITIVITY TROPONIN I: 315 ng/L — ABNORMAL HIGH (ref <12)
HIGH SENSITIVITY TROPONIN I: 298 ng/L — ABNORMAL HIGH (ref <12)

## 2023-03-06 LAB — PHOSPHORUS: PHOSPHORUS: 2.9 mg/dL (ref 2.0–4.5)

## 2023-03-06 MED ORDER — ACETAMINOPHEN 325 MG PO TAB
650 mg | ORAL | 0 refills | Status: DC
Start: 2023-03-06 — End: 2023-03-08
  Administered 2023-03-07 – 2023-03-08 (×6): 650 mg via ORAL

## 2023-03-06 MED ORDER — FUROSEMIDE 10 MG/ML IJ SOLN
20 mg | INTRAVENOUS | 0 refills | Status: CP
Start: 2023-03-06 — End: ?
  Administered 2023-03-06 – 2023-03-07 (×2): 20 mg via INTRAVENOUS

## 2023-03-06 MED ORDER — MAGNESIUM SULFATE IN D5W 1 GRAM/100 ML IV PGBK
1 g | INTRAVENOUS | 0 refills | Status: CP
Start: 2023-03-06 — End: ?
  Administered 2023-03-06: 14:00:00 1 g via INTRAVENOUS

## 2023-03-06 MED ORDER — POTASSIUM, SODIUM PHOSPHATES 280-160-250 MG PO PWPK
2 | Freq: Once | ORAL | 0 refills | Status: CP
Start: 2023-03-06 — End: ?
  Administered 2023-03-06: 14:00:00 2 via ORAL

## 2023-03-06 MED ORDER — PIPERACILLIN/TAZOBACTAM 4.5 G/100ML NS IVPB (MB+)
4.5 g | INTRAVENOUS | 0 refills | Status: DC
Start: 2023-03-06 — End: 2023-03-06

## 2023-03-06 MED ORDER — CALAMINE TP LOTN
TOPICAL | 0 refills | Status: DC | PRN
Start: 2023-03-06 — End: 2023-03-08

## 2023-03-06 MED ORDER — HYDROXYZINE HCL 25 MG PO TAB
50 mg | Freq: Three times a day (TID) | ORAL | 0 refills | Status: DC | PRN
Start: 2023-03-06 — End: 2023-03-08
  Administered 2023-03-07 – 2023-03-08 (×6): 50 mg via ORAL

## 2023-03-06 MED ORDER — MAGNESIUM SULFATE IN D5W 1 GRAM/100 ML IV PGBK
1 g | Freq: Once | INTRAVENOUS | 0 refills | Status: CP
Start: 2023-03-06 — End: ?
  Administered 2023-03-06: 07:00:00 1 g via INTRAVENOUS

## 2023-03-06 MED ORDER — METHOCARBAMOL 750 MG PO TAB
750 mg | Freq: Two times a day (BID) | ORAL | 0 refills | Status: DC
Start: 2023-03-06 — End: 2023-03-08
  Administered 2023-03-07 – 2023-03-08 (×4): 750 mg via ORAL

## 2023-03-06 MED ORDER — PANTOPRAZOLE 40 MG PO TBEC
40 mg | Freq: Every day | ORAL | 0 refills | Status: DC
Start: 2023-03-06 — End: 2023-03-08
  Administered 2023-03-07 – 2023-03-08 (×2): 40 mg via ORAL

## 2023-03-06 MED ORDER — ADULT PN CONTINUOUS-ION BASED
INTRAVENOUS | 0 refills | Status: CP
Start: 2023-03-06 — End: ?
  Administered 2023-03-07: 02:00:00 1800.0000 mL via INTRAVENOUS

## 2023-03-06 NOTE — Progress Notes
 PHYSICAL THERAPY  PROGRESS NOTE          Name: Amy Bowman   MRN: [id number]     DOB: [birth year]      Age: 82 y.o.  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 and associated posterior column and inferior pubic rami fractures which occurred after a GLF 6 months ago;  s/p repair of abdominal wall 7/13  Special Considerations: Home oxygen requirement (3L baseline)  Mental / Cognitive: Alert;Cooperative;Confused  Pain: Complains of pain  Pain Location: Post-surgical;Abdomen  Pain Interventions: Nursing staff notified of patient's pain level;Nurse provides pain medications;Patient agrees to participate in therapy with current pain level;Patient assisted into position of comfort  Persons Present: Nursing Staff    Home Living Situation  Lives With: Spouse/significant other  Type of Home: House  Entry Stairs: No stairs  In-Home Stairs: Able to live on main level  Bathroom Setup: Walk in shower (grab bars by toilet)  Patient Owned Equipment: Environmental consultant with wheels;Bathing: Shower chair (scooter and lift chair that patient sleeps in)  Comments: Patient reports one of her bathrooms is wide enough for her scooter to fit in, but the other one is not. Patient needs continued reinforcement that she is LLE WBAT for transfers only and will not be able to ambulate into her bathroom at home.    Prior Level of Function  Level Of Independence: Assist needed for IADL;Assist needed for mobility related ADL (husband assists with LE dressing as needed, pt typically wears gowns)  Comments: Pt primarily uses scooter for in-home mobility.  Takes scooter to doorway of bathroom, then able to use RW to ambulate short distance into bathroom.  Pt sleeps in lift chair.  Baseline mobility limited by LLE pain, indicates she needs orthopedic surgery in the future.  Required Assist For: All home functioning ADL;In and out of house (in/out of car - husband assists)    Precautions  L LE Precautions: LLE weight bearing for transfers only    Bed Mobility/Transfer  Bed Mobility: Supine to Sit: Moderate Assist;Assist with Trunk  Transfer Type: Sit to/from Stand;Stand Pivot  Transfer: Assistance Level: From;Bed;To;Bed Side Chair;Minimal Assist  Transfer: Assistive Device: Nurse, adult  Transfers: Type Of Assistance: Verbal Cues;For Safety Considerations;Requires Extra Time  End Of Activity Status: Up in Chair;Nursing Notified;Instructed Patient to Request Assist with Mobility;Instructed Patient to Use Call Light    Balance  Sitting Balance: Static Sitting Balance;Dynamic Sitting Balance;2 UE Support;Minimal Assist  Standing Balance: Static Standing Balance;2 UE support;Minimal Assist    Education  Persons Educated: Patient  Patient Barriers To Learning: Confusion  Interventions: Repetition of Instructions  Teaching Methods: Verbal Instruction;Demonstration  Patient Response: More Instruction Required  Topics: Plan/Goals of PT Interventions;Safety Awareness;Up with Assist Only;Importance of Increasing Activity;Recommend Continued Therapy;Precautions;Use of Assistive Device/Orthosis;Mobility Progression    Assessment/Progress  Impaired Mobility Due To: Decreased Strength;Decreased ROM;Safety Concerns;Impaired Balance;Weight Bearing Restrictions;Decreased Activity Tolerance;Deconditioning  Impaired Strength Due To: Deconditioning  Assessment/Progress: Should Improve w/ Continued PT  Comments: Patient currently requiring physical  assist with mobility and is requiring cues for safety with mobility.    AM-PAC 6 Clicks Basic Mobility Inpatient  Turning from your back to your side while in a flat bed without using bed rails: A Little  Moving from lying on your back to sitting on the side of a flat bed without using bedrails : A Little  Moving to and from a bed to a chair (including a wheelchair): A Little  Standing up from a chair using your arms (e.g. wheelchair, or bedside chair): A Little  To walk in hospital room: A Lot  Climbing 3-5 steps with a railing: Total  Basic Mobility Inpatient Raw Score: 15  Standardized (T-scale) Score: 36.97  Functional Stages - Basic Mobility Score Interpretation  34-51 Limited Mobility Indoors: Your score suggests significant difficulty in moving about independently and the need for assistance.  You may be able to move about in a small area of your home that has been adapted to eliminate safety hazards.  You may have difficulty moving from a sitting to standing position, climbing stairs and you may have a great deal of difficulty moving about outdoors and in the community.    Goals  Goal Formulation: With Patient/Family  Time For Goal Achievement: 7 days  Patient Will Go Supine To/From Sit: w/ Minimal Assist  Patient Will Transfer Bed/Chair: w/ Stand By Assist  Patient Will Transfer Sit to Stand: w/ Stand By Assist    Plan  Treatment Interventions: Mobility training  Plan Frequency: 1-2 Days per Week  PT Plan for Next Visit: bed mobility and transfer training, continue to educate on precautions    PT Discharge Recommendations  Recommendation: Home with consistent supervision/assistance  Recommendation for Therapy Post Discharge: Home health  Patient Currently Requires Physical Assist With: All mobility;All personal care ADLs;All home functioning ADLs  Patient Currently Requires Equipment: Commode: standard  Patient requires the use of a bedside commode to complete toileting due to an inability to use regular toilet facilities. The patient is confined to a single room.    Therapist: Clent Czar, PT, DPT  Date: 03/06/2023

## 2023-03-06 NOTE — Progress Notes
 Daily Progress Note      Today's Date:  03/06/2023  Name:  LESHA JAGER                       MRN:  [id number]   Admission Date: 02/24/2023 (LOS: 10 days)                     Assessment: Dashley Monts is an 82 year old female with PMH of COPD, chronic respiratory failure (baseline O2 3 lpm), previous tobacco abuse, CAD s/p CABG, HTN, HLD, HFpEF, DM2 recent ventral hernia with secondary small bowel obstruction s/p ex lap with ventral hernia repair (4/24) now with recurrent SBO s/p ex-lap, SB resection, repair of serosal tear, and LOA (7/7) c/b fascial dehiscence s/p re-exlap w/ fascial closure.      Plan:  - Hypoxia overnight with increasing oxygen requirement - CXR with decreased lung volumes and pulmonary edema. Increased RT therapy, ABG pending   - Ortho consulted for hip pain - NMB LLE  - NPO   - Maintain NGT   - PPN until tolerating adequate PO intake   - BID PPI  - MMPC  - PTA meds as appropriate  - DVT ppx     Discussed with Dr. Micael Adas  _____________________________________________________________________________    Subjective:  No acute events overnight. Worsening shortness of breath overnight. Worsening abdominal pain.     Objective:  BP: (94-115)/(46-72)   Temp:  [36.3 ?C (97.3 ?F)-36.7 ?C (98.1 ?F)]   Pulse:  [87-112]   Respirations:  [16 PER MINUTE-20 PER MINUTE]   SpO2:  [86 %-97 %]   O2 Device: Nasal cannula  O2 Liter Flow: 5 Lpm  Body mass index is 44.75 kg/m?[name]    Lab Results   Component Value Date/Time    HGB 9.3 (L) 03/06/2023 02:00 AM    HCT 28.4 (L) 03/06/2023 02:00 AM    WBC 8.4 03/06/2023 02:00 AM    PLTCT 357 03/06/2023 02:00 AM    INR 1.2 01/28/2023 04:49 PM     Lab Results   Component Value Date/Time    NA 138 03/06/2023 02:00 AM    K 3.9 03/06/2023 02:00 AM    CL 107 03/06/2023 02:00 AM    CO2 23 03/06/2023 02:00 AM    BUN 18 03/06/2023 02:00 AM    CR 0.93 03/06/2023 02:00 AM    MG 1.9 03/06/2023 02:00 AM    PO4 2.9 03/06/2023 02:00 AM    CA 8.2 (L) 03/06/2023 02:00 AM     Lab Results   Component Value Date/Time    GLUPOC 142 (H) 03/05/2023 10:17 PM    GLUPOC 136 (H) 03/05/2023 04:15 PM    GLUPOC 130 (H) 03/05/2023 11:17 AM          Physical Exam  Constitutional:       Appearance: Normal appearance.   HENT:      Head: Normocephalic and atraumatic.      Nose: Nose normal.   Pulmonary:      Effort: Pulmonary effort is normal.   Abdominal:      General: There is mild distention      Palpations: Abdomen is soft.      Tenderness: appropriate TTP     Comments: Midline laparotomy with staples in place,    Musculoskeletal:         General: Normal range of motion.      Cervical back: Normal range of motion.  Skin:     General: Skin is warm and dry.   Neurological:      General: No focal deficit present.      Mental Status: She is alert and oriented to person, place, and time.   Psychiatric:         Mood and Affect: Mood normal.         Behavior: Behavior normal.     ICD-10 code E44: Acute illness/Moderate non-severe malnutrition  Energy Intake: Less than 75% of estimated energy requirement for greater than 7days, Mild loss of muscle mass, Mild fluid accumulation        Loss of Subcutaneous Fat: Yes Mild Orbital  Muscle Wasting: Yes Mild Temple  Edema: Yes Mild Lower extremities  Malnutrition Interventions: Initiation of nutrition support    Active Wounds          Wounds Moisture associated skin damage Anterior;Left;Proximal Thigh (Active)   02/24/23 1400   Wound Type: Moisture associated skin damage   Orientation: Anterior;Left;Proximal   Location: Thigh   Wound Location Comments:    Initial Wound Site Closure:    Initial Dressing Placed:    Initial Cycle:    Initial Suction Setting (mmHg):    Pressure Injury Stages:    Pressure Injury Present Within 24 Hours of Hospital Admission:    If This Pressure Injury Is Suspected to Be Device Related, Please Select the Device::    Is the Wound Open or Closed:    Wound Assessment Moist;Pink 03/05/23 2317   Peri-wound Assessment Intact 03/05/23 2317   Wound Drainage Amount None 03/05/23 2317   Wound Dressing Status None/open to air 03/05/23 2317   Wound Care Dressing changed or new application 03/01/23 0515   Wound Dressing and/or Treatment A & D ointment 03/05/23 2317   Number of days: 10       Wounds Surgical incision Umbilicus (Active)   02/25/23 1318   Wound Type: Surgical incision   Orientation:    Location: Umbilicus   Wound Location Comments:    Initial Wound Site Closure: Staples   Initial Dressing Placed: Gauze;Hypafix tape   Initial Cycle:    Initial Suction Setting (mmHg):    Pressure Injury Stages:    Pressure Injury Present Within 24 Hours of Hospital Admission:    If This Pressure Injury Is Suspected to Be Device Related, Please Select the Device::    Is the Wound Open or Closed:    Wound Assessment Pink;Wound edges approximated 03/05/23 2317   Wound Site Closure Staples 03/05/23 2317   Peri-wound Assessment Dry;Intact 03/05/23 2317   Wound Drainage Amount None 03/05/23 2317   Wound Drainage Description Serosanguineous 03/04/23 0832   Wound Dressing Status Intact 03/05/23 2317   Wound Care Dressing changed or new application 03/05/23 2317   Wound Dressing and/or Treatment Dry gauze;Hypafix tape 03/05/23 2317   Number of days: 9                                              ___________________________    Jolyne Needs, MD

## 2023-03-06 NOTE — Procedures
 VAT consulted for new PIV. Upon arrival, I spoke to pt's night RN, Glorine Laroche. He informed me that there was no need for a 2nd PIV at this time. He will re consult if that need changes. Consult complete

## 2023-03-06 NOTE — Progress Notes
 Nutrition Support Service (NSS) Progress Note      PN therapy start date: 03/03/23  PN Indication: Non-functioning or insufficient profusion to GI tract (known or expected)  Estimated PN therapy end date: days, once Pt has return of bowl functions and is able to tolerate adequate PO intake      Current Peripheral PN Formula:   Nonstandard PPN: 64ml/hr x 24hr (1800 ml total volume)    Macronutrients: AA  65g , Dex 120 g,  Clinolipids 150 ml     Lytes: Na 135 mEq (1/2 NS), K 40 mEq, Ca 10 mEq, Mg 16 mEq, Phos 14 mmol, 2:1 AV:[id number]  Additives: 10 ml multivitamin, 1ml trace elements, and 100mg  thiamine  PPN provides 968 kcals (17 kcals/kg; 69% estimated needs), 65 g AA (1.2 g/kg, 97% estimated needs)     Diet Order: NPO  TF: n/a  Pertinent allergies/intolerances: none pertinent noted    I/O: (incomplete) UOP: NGT: BM 0    Meds: Scheduled Meds:acetaminophen (OFIRMEV) 1,000 mg injection 100 mL, 1,000 mg, Intravenous, Q8H  senna (SENOKOT) oral syrup 8.8 mg, 8.8 mg, Oral, BID   And  docusate sodium (COLACE) oral solution 50 mg, 50 mg, Oral, BID  enoxaparin (LOVENOX) syringe 40 mg, 40 mg, Subcutaneous, BID  fluticasone furoate (ARNUITY ELLIPTA) 200 mcg/actuation inhaler 1 puff, 1 puff, Inhalation, QDAY   And  umeclidinium-vilanteroL (ANORO ELLIPTA) 62.5-25 mcg/actuation inhaler 1 puff, 1 puff, Inhalation, QDAY  insulin aspart (U-100) (NOVOLOG FLEXPEN U-100 INSULIN) injection PEN 0-12 Units, 0-12 Units, Subcutaneous, ACHS (22)  magnesium sulfate   1 g/D5W 100 mL IVPB, 1 g, Intravenous, Q4H  melatonin tablet 5 mg, 5 mg, Per NG tube, QHS  methocarbamoL (ROBAXIN) injection 500 mg, 500 mg, Intravenous, Q8H*  metoprolol (LOPRESSOR) injection 2.5 mg, 2.5 mg, Intravenous, Q6H*  pantoprazole (PROTONIX) injection 40 mg, 40 mg, Intravenous, JYN(82-95)  potassium, sodium phosphates (PHOS-NaK) packet 2 packet, 2 packet, Oral, ONCE    Continuous Infusions:   Adult Continuous Parenteral Nutrition (PN) 75 mL/hr at 03/05/23 2053     PRN and Respiratory Meds:albuterol sulfate Q4H PRN, calcium carbonate Q4H PRN, dextrose 50% (D50) IV PRN, fentaNYL citrate PF Q1H PRN, hydrOXYzine HCL TID PRN, lidocaine hcl PRN, nalOXone PRN, ondansetron Q6H PRN **OR** ondansetron (ZOFRAN) IV Q6H PRN, phenoL PRN, simethicone Q6H PRN    Electrolyte Treatments:  none yesterday; 2g IV mag sulfate scheduled today     Pertinent Labs:   Comprehensive Metabolic Profile    Lab Results   Component Value Date/Time    NA 138 03/06/2023 02:00 AM    K 3.9 03/06/2023 02:00 AM    CL 107 03/06/2023 02:00 AM    CO2 23 03/06/2023 02:00 AM    GAP 8 03/06/2023 02:00 AM    BUN 18 03/06/2023 02:00 AM    CR 0.93 03/06/2023 02:00 AM    GLU 158 (H) 03/06/2023 02:00 AM    Lab Results   Component Value Date/Time    CA 8.2 (L) 03/06/2023 02:00 AM    PO4 2.9 03/06/2023 02:00 AM    ALBUMIN 2.1 (L) 03/04/2023 06:08 AM    TOTPROT 4.8 (L) 03/04/2023 06:08 AM    ALKPHOS 117 (H) 03/04/2023 06:08 AM    AST 16 03/04/2023 06:08 AM    ALT 11 03/04/2023 06:08 AM    TOTBILI 0.3 03/04/2023 06:08 AM    GFR 67.4 12/26/2022 12:00 AM    GFRAA >60 01/30/2019 03:40 AM        Lab Results  Component Value Date    MG 1.9 03/06/2023     Lab Results   Component Value Date    TRIG 68 03/04/2023     Lab Results   Component Value Date    GLUPOC 183 (H) 03/06/2023    GLUPOC 142 (H) 03/05/2023    GLUPOC 136 (H) 03/05/2023    GLUPOC 130 (H) 03/05/2023    GLUPOC 135 (H) 03/05/2023       Admit Weight (Kg): Weight: 96.3 kg (212 lb 4.9 oz)   Current Weight (Kg): Weight: 100.5 kg (221 lb 9 oz) (7/13 last filed weight)  TPN Dose Weight (Kg): 56kg    Estimated Kcal Needs: 1400-1680 (25-30 kcal/kg DBW 56 kg)   Estimated Protein Needs: 67-84 (1.2-1.5 g/kg DBW 56 kg)        Malnutrition Details:  Malnutrition present  ICD-10 code E44: Acute illness/Moderate non-severe malnutrition  Energy Intake: Less than 75% of estimated energy requirement for greater than 7days, Mild loss of muscle mass, Mild fluid accumulation Loss of Subcutaneous Fat: Yes Mild Orbital  Muscle Wasting: Yes Mild Temple  Edema: Yes Mild Lower extremities        Malnutrition Interventions: Initiation of nutrition support    A/P: Amy Bowman is an 82 year old female with PMH of COPD, chronic respiratory failure (baseline O2 3 lpm), previous tobacco abuse, CAD s/p CABG, HTN, HLD, HFpEF, DM2 recent ventral hernia with secondary small bowel obstruction s/p ex lap with ventral hernia repair (4/24) now with recurrent SBO s/p ex-lap, SB resection, repair of serosal tear, and LOA (7/7). Pt seen by fellow RD 7/12. She reported not eating well after surgery and is now NPO.  Good appetite and po intake prior to admission report.  Patient stated that she thought she was doing pretty well with eating before hospitalization. Pt attempted Regular diet post-op, yet developed abdominal pain and drainage from midline 7/12. CT showed possible ileus vs early post-op SBO; NG tube placed. NSS consulted to start TPN after 1pm deadline 7/12. VAT consulted for PICC line placement, yet pt has history of bilateral shoulder surgeries and cannot position her arms out far enough for PICC plaecment. Patient would need to go to IR for placement of a tunneled CVC. NSS on-call assisted team with custom PPN start 7/13.       7/16.  Day 4 of PPN. NGT to LIWS, awaiting bowel function return. Additional PIV placed 7/14. Team wishes to continue PN via peripheral access for now. Current bag unchanged from start on 7/13; lytes trends reviewed; mag sulfate given this am. Increasing mag and phos in next TPN bag. Slightly able to increase lipids provided but unable to meet kcal/pro needs within current PPN volume.     PN Changes (to start at 20:30):  Increased to 24 mEq magnesium  Increased to 18 mmol phos  Increased to lipids     PPN to provide 1008 kcals (18 kcals/kg; 72% estimated needs), 65 g AA (1.2 g/kg, 97% estimated needs)    Recommendations:  If anticipating prolonged return of bowel function, would place CVC for PN to be able to provide 100% estimated kcal/pro needs    Armenta Landau, MS, RD, LD, CNSC     Available on Voalte

## 2023-03-06 NOTE — Progress Notes
 Summary of Today's Recommendations  - would implement delirium precautions with focus on treating pain and allowing sufficient time for uninterrupted sleep   - would continue to monitor volume status closely  - pt requesting to go home, would request repeat BMP and Mg level one week after dc by home health with results called to PCP  - would recommend dc of methocarbamol and initiation of 2mg  tizanidine PRN q 8 hours for muscle spasms only   > Recommend buspar 5 mg bid for anxiety symptoms, instead of hydroxyzine   > Recommend switching from [facility] to [facility]        Assessment and Plan  1. Mentation  At risk for delirium   Delirium screen: Negative   Dementia screen: Negative  Depression screen: Positive  Family reports recurrent encephalopathy 3 days post-op after most procedures. They are concerned it is related to po pain medications, which will attempt to evaluate now that she will be NPO again. Discussed delirium precautions, interventions they can use, and risk of delirium from meds as well as from pain. Will monitor and provide supportive care.     With significant improvement today. Would be cautious when restarting home meds      2. Mobility  Frailty   L acetabular fracture   FRAIL score: 4 (Robust = 0, Prefrail = 1-2, Frail = 3-5)  Recommended inpatient fall prevention strategies- progressive mobility, NWB LLE, WB for transfers only   Tethers- minimize as able  Baseline functional status: Independent with basic ADLs; dependent with instrumental ADLs   Prior living situation: Home with spouse      3. Medication - at risk for polypharmacy  Anticholinergic Burden Calculator Score ([email or web address]): 3+ suggests higher risk of falls, memory issues, and related complications  Inpatient Total Score: 6  -Individual medications (acb score): fentanyl 25-50 mcg q1h prn 1, methocarbamol 500 mg q8h 3, hydroxyzine 50 mg tid prn 1, pantoprazole 40 mg bid 1   Outpatient total Score:  1  -Individual Medications (acb score): hydrocodone 1     4. Transitions of Care  A member of our team will attempt to connect with a physician/APP at the receiving institution if the patient discharges with post-acute services.   Please review the following for recommendations that would helpful in your last several days of notes and the discharge summary to supplement these phone calls:     Recurrent SBO  - Management per primary team   - Parenteral nutrition (7/13 - 7/16 )     HFpEF  HTN  - PTA: po torsemide 20 mg qday, metoprolol 25 mg qday, losartan 25 mg qday  - Current regimen: iv metoprolol 2.5 mg q6h     COPD  Chronic hypoxemic respiratory failure   - PTA: trelegy 1 puff qday, albuterol-ipratropium nebulizer 3mL bid prn, albuterol 2 puffs q6h prn   - Current regimen: fluticasone + umeclidinium + vilanterol 1 puff qday + albuterol 2 puffs q4h prn     T2DM  - PTA metformin 1000 mg qday + SSI   - Current regimen: [facility]      Chronic anemia   Anemia likely from blood loss   - Baseline ~ 10.5  - Iron panel (03/02/23) Mixed iron deficiency and ACD                Iron 34               % saturation 19  TIBC 177               Ferritin 287  - Vitamin B12 (03/02/23) 576  - Deferring transfusion of iv iron at this time due to limited data for benefit in HFpEF. Most studies conducted in patients with EF < 45%     HLD     Calcified subpleural pulmonary nodule   - Requires evaluation, possible bronchoscopy     5. Matters Most  - pt would like to dc home with family support    Interval Events    Pt sitting in bed on exam. Reports intermittent pain. Reports not sleeping well. Denies nausea, currently with corpak but anticipate removal later today. Reviewed possible dc plans. Pt states she has a recliner chair at home that she is able to get out of and is able to have assistance to pivot. Pt's family at bedside and agreeable to plan for dc home.     Summary of recent therapy notes: pt assist x1 to transfer to chair, therapy limited by weakness and pain Relevant medication changes since last visit: reviewed       REVIEW OF SYSTEMS  Reports intermittent pain, denies nausea     PHYSICAL EXAM  Vitals:    03/06/23 0602 03/06/23 0608 03/06/23 0612 03/06/23 0619   BP:    107/63   BP Source:    Arm, Left Lower   Pulse: 104 99 100 104   Temp:    36.6 ?C (97.9 ?F)   SpO2: 93% 92% 92% (!) 91%   O2 Device: Nasal cannula Nasal cannula Nasal cannula Nasal cannula   O2 Liter Flow: 5 Lpm 5 Lpm 5 Lpm 5 Lpm   Weight:       Height:         General: NAD  HEENT: MMM, corpak in place   Lungs: even, unlabored breathing   Extremities: +2 BLE edema   Neuro: alert, clear speech   - UB2: day of week Yes   months backwards Yes   Psych: calm  Skin: warm and dry     Labs  CBC w diff    Lab Results   Component Value Date/Time    WBC 8.4 03/06/2023 02:00 AM    RBC 3.00 (L) 03/06/2023 02:00 AM    HGB 9.3 (L) 03/06/2023 02:00 AM    HCT 28.4 (L) 03/06/2023 02:00 AM    MCV 94.6 03/06/2023 02:00 AM    MCH 31.0 03/06/2023 02:00 AM    MCHC 32.7 03/06/2023 02:00 AM    RDW 20.9 (H) 03/06/2023 02:00 AM    PLTCT 357 03/06/2023 02:00 AM    MPV 7.4 03/06/2023 02:00 AM    Lab Results   Component Value Date/Time    NEUT 54 06/02/2009 11:27 AM    ANC 2.61 06/02/2009 11:27 AM    LYMA 29 06/02/2009 11:27 AM    ALC 1.38 06/02/2009 11:27 AM    MONA 10 06/02/2009 11:27 AM    AMC 0.48 06/02/2009 11:27 AM    EOSA 6 (H) 06/02/2009 11:27 AM    AEC 0.29 06/02/2009 11:27 AM    BASA 1 06/02/2009 11:27 AM    ABC 0.03 06/02/2009 11:27 AM        Basic Metabolic Profile    Lab Results   Component Value Date/Time    NA 138 03/06/2023 02:00 AM    K 3.9 03/06/2023 02:00 AM    CA 8.2 (L) 03/06/2023 02:00 AM    CL 107  03/06/2023 02:00 AM    CO2 23 03/06/2023 02:00 AM    GAP 8 03/06/2023 02:00 AM    Lab Results   Component Value Date/Time    BUN 18 03/06/2023 02:00 AM    CR 0.93 03/06/2023 02:00 AM    GLU 158 (H) 03/06/2023 02:00 AM           Radiology  7/16- CXR- 1.  Decreased lung volume with slightly increased perihilar and bibasilar   opacities and indistinct pulmonary vasculature, likely a combination of   atelectasis and pulmonary edema.   2.  Small left pleural effusion.   3.  Peripheral right upper lobe opacity with underlying emphysema and   fibrosis better evaluated on recent CT chest.     Total Time Today was 35 minutes in the following activities: Preparing to see the patient, Performing a medically appropriate examination and/or evaluation, Counseling and educating the patient/family/caregiver, Referring and communication with other health care professionals (when not separately reported), and Documenting clinical information in the electronic or other health record

## 2023-03-06 NOTE — Case Management (ED)
 Case Management Progress Note    NAME:Amy Bowman                          MRN: [id number]              DOB:09/15/1940          AGE: 82 y.o.  ADMISSION DATE: 02/24/2023             DAYS ADMITTED: LOS: 10 days      Today's Date: 03/06/2023    PLAN: ongoing    Expected Discharge Date: 03/08/2023 12:00 PM  Is Patient Medically Stable: No, Please explain: NGT, increased O2  Are there Barriers to Discharge? no    INTERVENTION/DISPOSITION:  Discharge Planning              Discharge Planning: Home Health    NCM reviewed EMR/attended team huddle  Patient requiring more oxygen overnight  NGT remains in  PT/OT recs for [facility]. Met with pt to discuss post-acute services recommended. Provided choice list with quality data from medicare.gov Compare and offered to answer questions. Patient selected the following: [facility]. Patient formerly had [facility] and would prefer to return. Referrals being reviewed  NCM following as needed    Transportation              Does the Patient Need Case Management to Arrange Discharge Transport? (ex: facility, ambulance, wheelchair/stretcher, Medicaid, cab, other): No  Will the Patient Use Family Transport?: Yes  Transportation Name, Phone and Availability #1: [name] [phone]  Support              Support: Pt/Family Updates re:POC or DC Plan, Patient Education, Huddle/team update  Info or Referral              Information or Referral to [facility]: No Needs Identified  Positive SDOH Domains and Potential Barriers                   Medication Needs              Medication Needs: No Needs Identified                                                                                                                                          Financial              Financial: No Needs Identified  Legal              Legal: No Needs Identified  Other              Other/None: No needs identified  Discharge Disposition Selected Continued Care - Admitted Since 02/24/2023    No services have been selected for the patient.           Gioia Laity, RN    Gioia Laity RN, BSN,  NCM  Integrated Nurse Case Manager  Phone: [phone]  Pager: [phone]

## 2023-03-07 ENCOUNTER — Inpatient Hospital Stay: Admit: 2023-03-07 | Discharge: 2023-03-06 | Payer: MEDICARE

## 2023-03-07 LAB — BASIC METABOLIC PANEL
ANION GAP: 10 % — ABNORMAL LOW (ref 3–12)
BLD UREA NITROGEN: 18 mg/dL — ABNORMAL LOW (ref 7–25)
GLUCOSE,PANEL: 151 mg/dL — ABNORMAL HIGH (ref 70–100)
POTASSIUM: 4.4 MMOL/L — ABNORMAL LOW (ref 3.5–5.1)
SODIUM: 135 MMOL/L — ABNORMAL LOW (ref 137–147)

## 2023-03-07 LAB — POC GLUCOSE
POC GLUCOSE: 143 mg/dL — ABNORMAL HIGH (ref 70–100)
POC GLUCOSE: 148 mg/dL — ABNORMAL HIGH (ref 70–100)
POC GLUCOSE: 196 mg/dL — ABNORMAL HIGH (ref 70–100)
POC GLUCOSE: 160 mg/dL — ABNORMAL HIGH (ref 70–100)

## 2023-03-07 LAB — CBC
MCHC: 32 g/dL — ABNORMAL LOW (ref 32.0–36.0)
MPV: 7.4 FL (ref 7–11)
PLATELET COUNT: 405 10*3/uL — ABNORMAL HIGH (ref >60)
RDW: 20 % — ABNORMAL HIGH (ref 11–15)

## 2023-03-07 LAB — MAGNESIUM: MAGNESIUM: 2 mg/dL — ABNORMAL LOW (ref 1.6–2.6)

## 2023-03-07 LAB — PHOSPHORUS: PHOSPHORUS: 3.3 mg/dL — ABNORMAL LOW (ref 2.0–4.5)

## 2023-03-07 MED ORDER — METOPROLOL SUCCINATE 25 MG PO TB24
25 mg | Freq: Every day | ORAL | 0 refills | Status: DC
Start: 2023-03-07 — End: 2023-03-08
  Administered 2023-03-07 – 2023-03-08 (×2): 25 mg via ORAL

## 2023-03-07 MED ORDER — BISACODYL 10 MG RE SUPP
10 mg | Freq: Once | RECTAL | 0 refills | Status: CP
Start: 2023-03-07 — End: ?
  Administered 2023-03-07: 22:00:00 10 mg via RECTAL

## 2023-03-07 MED ORDER — FUROSEMIDE 10 MG/ML PO SOLN
20 mg | ORAL | 0 refills | Status: CP
Start: 2023-03-07 — End: ?
  Administered 2023-03-07 (×2): 20 mg via ORAL

## 2023-03-07 MED ORDER — OXYCODONE 5 MG PO TAB
5 mg | ORAL | 0 refills | Status: DC | PRN
Start: 2023-03-07 — End: 2023-03-08
  Administered 2023-03-07 – 2023-03-08 (×5): 5 mg via ORAL

## 2023-03-07 NOTE — Progress Notes
 OCCUPATIONAL THERAPY  PROGRESS NOTE      Name: Amy Bowman   MRN: [id number]     DOB: 05-Jun-1941      Age: 82 y.o.  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 and associated posterior column and inferior pubic rami fractures which occurred after a GLF 6 months ago;  s/p repair of abdominal wall 7/13  Special Considerations: Home oxygen requirement;Current oxygen requirement (4L currently; 3L baseline)  Mental / Cognitive: Alert;Cooperative;Confused  Pain: Complains of pain  Pain Location: Post-surgical;Abdomen  Pain Interventions: Nursing staff notified of patient's pain level;Nurse provides pain medications;Patient agrees to participate in therapy with current pain level;Patient assisted into position of comfort  Persons Present: Nursing Staff    Home Living Situation  Lives With: Spouse/significant other  Type of Home: House  Entry Stairs: No stairs  In-Home Stairs: Able to live on main level  Bathroom Setup: Walk in shower (grab bars by toilet)  Patient Owned Equipment: Environmental consultant with wheels;Bathing: Shower chair (scooter and lift chair that patient sleeps in)  Comments: Patient reports one of her bathrooms is wide enough for her scooter to fit in, but the other one is not. Patient needs continued reinforcement that she is LLE WBAT for transfers only and will not be able to ambulate into her bathroom at home.    Prior Level of Function  Level Of Independence: Assist needed for IADL;Assist needed for mobility related ADL (husband assists with LE dressing as needed, pt typically wears gowns)  Comments: Pt primarily uses scooter for in-home mobility.  Takes scooter to doorway of bathroom, then able to use RW to ambulate short distance into bathroom.  Pt sleeps in lift chair.  Baseline mobility limited by LLE pain, indicates she needs orthopedic surgery in the future.  Required Assist For: All home functioning ADL;In and out of house (in/out of car - husband assists)    Precautions  L LE Precautions: LLE weight bearing for transfers only    ADL's  Where Assessed:  (commode)  Eating Assist: Stand By Assist  Eating Deficits: Beverage Management  Toileting Assist: Maximum Assist  Toileting Deficits: Perineal Hygiene;Bedside Commode  Comment: Pt on commode upon OT entrance, c/o increased abdominal pain.    ADL Mobility  Transfer Type: Sit to stand  Transfer: Assistance Level: Moderate assist;x2 people;From;Commode  Transfer: Assistive Device: Engineer, mining Type: Stand pivot  Other Transfer: Assistance Level: Minimal assist;Commode;To;Bedside chair  Other Transfer: Assistive Device: Roller walker  End of Activity Status: Up in chair;Instructed patient to request assist with mobility;Instructed patient to use call light;Nursing notified  Sitting Balance: Standby assist  Standing Balance: Minimal assist    Activity Tolerance  Endurance: 2/5 Tolerates 10-20 Minutes Exercise w/Multiple Rests    Cognition  Overall Cognitive Status: Confused    UE Strength / Tone  Strength Comments: generalized weakness    Education  Persons Educated: Patient  Topics: Role of OT, Goals for Therapy    Assessment  Assessment: Decreased ADL Status;Decreased Endurance;Decreased Self-Care Trans;Decreased High-Level ADLs  Prognosis: Good  Goal Formulation: Patient    AM-PAC 6 Clicks Daily Activity Inpatient  Putting on and taking off regular lower body clothes: A Lot  Bathing (Including washing, rinsing, drying): A Lot  Toileting, which includes using toilet, bedpan, or urinal: A Lot  Putting on and taking off regular upper body clothing: A Little  Taking care of personal grooming such as brushing teeth: None  Eating meals: None  Daily Activity Raw Score: 17  Standardized (T-scale) Score: 37.26    Plan  OT Frequency: 3-5x/week  OT Plan for Next Visit: stand/pivot transfers; toileting on commode    ADL Goals  Patient Will Perform All ADL's: w/ Stand By Assist    Functional Transfer Goals  Pt Will Transfer To Bedside Commode: w/ Stand By Assist    OT Discharge Recommendations  Recommendation: Home with consistent supervision/assistance (vs. inpatient setting)  Comments: Patient refusing placement. If spouse is not able to provide consistent physical assistance, she will benefit from inpatient setting.    Therapist: Arrie Bienenstock, OTR/L [id number]  Date: 03/07/2023

## 2023-03-07 NOTE — Progress Notes
 Daily Progress Note      Today's Date:  03/07/2023  Name:  Amy Bowman                       MRN:  [id number]   Admission Date: 02/24/2023 (LOS: 11 days)                     Assessment: Amy Bowman is an 82 year old female with PMH of COPD, chronic respiratory failure (baseline O2 3 lpm), previous tobacco abuse, CAD s/p CABG, HTN, HLD, HFpEF, DM2 recent ventral hernia with secondary small bowel obstruction s/p ex lap with ventral hernia repair (4/24) now with recurrent SBO s/p ex-lap, SB resection, repair of serosal tear, and LOA (7/7) c/b fascial dehiscence s/p re-exlap w/ fascial closure.      Plan:  - Regular diet   - d/c ppn  - transition to PO meds  - encourage IS use  - 20 mg lasix now, repeat dose in 6 hours  - Ortho consulted for hip pain - NMB LLE  - BID PPI  - MMPC  - PTA meds as appropriate  - DVT ppx     Dispo: likely tomorrow with home health  Discussed with Dr. Micael Adas  _____________________________________________________________________________    Subjective:  No acute events overnight. Feeling much better. Tolerating diet. Pain well tolerated. Denies N/V.     Objective:  BP: (90-124)/(52-79)   Temp:  [36.3 ?C (97.3 ?F)-36.8 ?C (98.2 ?F)]   Pulse:  [80-107]   Respirations:  [20 PER MINUTE-22 PER MINUTE]   SpO2:  [93 %-100 %]   O2 Device: Nasal cannula  O2 Liter Flow: 4 Lpm  Body mass index is 44.84 kg/m?[name]    Lab Results   Component Value Date/Time    HGB 9.3 (L) 03/07/2023 06:24 AM    HCT 28.8 (L) 03/07/2023 06:24 AM    WBC 9.5 03/07/2023 06:24 AM    PLTCT 405 (H) 03/07/2023 06:24 AM    INR 1.2 01/28/2023 04:49 PM     Lab Results   Component Value Date/Time    NA 135 (L) 03/07/2023 06:24 AM    K 4.4 03/07/2023 06:24 AM    CL 104 03/07/2023 06:24 AM    CO2 21 03/07/2023 06:24 AM    BUN 18 03/07/2023 06:24 AM    CR 0.69 03/07/2023 06:24 AM    MG 2.0 03/07/2023 06:24 AM    PO4 3.3 03/07/2023 06:24 AM    CA 8.3 (L) 03/07/2023 06:24 AM     Lab Results   Component Value Date/Time    GLUPOC 148 (H) 03/07/2023 08:04 AM    GLUPOC 143 (H) 03/06/2023 10:24 PM    GLUPOC 135 (H) 03/06/2023 05:42 PM          Physical Exam  Constitutional:       Appearance: Normal appearance.   HENT:      Head: Normocephalic and atraumatic.      Nose: Nose normal.   Pulmonary:      Effort: Pulmonary effort is normal.   Abdominal:      General: There is mild distention      Palpations: Abdomen is soft.      Tenderness: appropriate TTP     Comments: Midline laparotomy with staples in place,    Musculoskeletal:         General: Normal range of motion.      Cervical back: Normal range of motion.  Skin:     General: Skin is warm and dry.   Neurological:      General: No focal deficit present.      Mental Status: She is alert and oriented to person, place, and time.   Psychiatric:         Mood and Affect: Mood normal.         Behavior: Behavior normal.     ICD-10 code E44: Acute illness/Moderate non-severe malnutrition  Energy Intake: Less than 75% of estimated energy requirement for greater than 7days, Mild loss of muscle mass, Mild fluid accumulation        Loss of Subcutaneous Fat: Yes Mild Orbital  Muscle Wasting: Yes Mild Temple  Edema: Yes Mild Lower extremities  Malnutrition Interventions: Initiation of nutrition support    Active Wounds          Wounds Moisture associated skin damage Anterior;Left;Proximal Thigh (Active)   02/24/23 1400   Wound Type: Moisture associated skin damage   Orientation: Anterior;Left;Proximal   Location: Thigh   Wound Location Comments:    Initial Wound Site Closure:    Initial Dressing Placed:    Initial Cycle:    Initial Suction Setting (mmHg):    Pressure Injury Stages:    Pressure Injury Present Within 24 Hours of Hospital Admission:    If This Pressure Injury Is Suspected to Be Device Related, Please Select the Device::    Is the Wound Open or Closed:    Wound Assessment Moist;Pink 03/06/23 2030   Peri-wound Assessment Intact 03/06/23 2030   Wound Drainage Amount None 03/06/23 2030   Wound Dressing Status None/open to air 03/06/23 2030   Wound Care Dressing changed or new application 03/01/23 0515   Wound Dressing and/or Treatment A & D ointment 03/06/23 2030   Number of days: 11       Wounds Surgical incision Umbilicus (Active)   02/25/23 1318   Wound Type: Surgical incision   Orientation:    Location: Umbilicus   Wound Location Comments:    Initial Wound Site Closure: Staples   Initial Dressing Placed: Gauze;Hypafix tape   Initial Cycle:    Initial Suction Setting (mmHg):    Pressure Injury Stages:    Pressure Injury Present Within 24 Hours of Hospital Admission:    If This Pressure Injury Is Suspected to Be Device Related, Please Select the Device::    Is the Wound Open or Closed:    Wound Assessment Pink;Wound edges approximated 03/06/23 2030   Wound Site Closure Staples 03/06/23 2030   Peri-wound Assessment Dry;Intact 03/06/23 2030   Wound Drainage Amount None 03/06/23 2030   Wound Drainage Description Serosanguineous 03/04/23 0832   Wound Dressing Status Intact 03/06/23 2030   Wound Care Dressing changed or new application 03/05/23 2317   Wound Dressing and/or Treatment Dry gauze;Hypafix tape 03/05/23 2317   Number of days: 10                                              ___________________________    Evon Hoguet, MD

## 2023-03-07 NOTE — Progress Notes
 ATTESTATION    I personally performed or re-performed the history, physical exam and treatment for the E/M. I discussed the case with the APRN/PA student, and concur with the APRN/PA student documentation of history, physical exam and treatment plan unless otherwise noted.    Staff name:  Gregg Leap, APRN-NP Date: 03/07/2023     Summary of Today's Recommendations:  > - would implement delirium precautions with focus on treating pain and allowing sufficient time for uninterrupted sleep   - would continue to monitor volume status closely  - pt requesting to go home, would request repeat BMP and Mg level one week after dc by home health with results called to PCP  - would recommend dc of methocarbamol and initiation of 2mg  tizanidine PRN q 8 hours for muscle spasms only   > Recommend buspar 5 mg bid for anxiety symptoms, instead of hydroxyzine, could also consider prn 25 mg trazodone q 6 hrs  > Recommend switching from [facility] to [facility]    Assessment and Plan    1. Mentation  1. Mentation  At risk for delirium   Delirium screen: Negative   Dementia screen: Negative  Depression screen: Positive  Family reports recurrent encephalopathy 3 days post-op after most procedures. They are concerned it is related to po pain medications, which will attempt to evaluate now that she will be NPO again. Discussed delirium precautions, interventions they can use, and risk of delirium from meds as well as from pain. Will monitor and provide supportive care.     With significant improvement today. Would be cautious when restarting home meds     2. Mobility  Frailty   L acetabular fracture   FRAIL score: 4 (Robust = 0, Prefrail = 1-2, Frail = 3-5)  Recommended inpatient fall prevention strategies- progressive mobility, NWB LLE, WB for transfers only   Tethers- minimize as able  Baseline functional status: Independent with basic ADLs; dependent with instrumental ADLs   Prior living situation: Home with spouse      3. Medication  Anticholinergic Burden Calculator Score ([email or web address]): 3+ suggests higher risk of falls, memory issues, and related complications  Inpatient Total Score: 6  -Individual medications (acb score): methocarbamol 500 mg q8h 3, hydroxyzine 50 mg tid prn 1, pantoprazole 40 mg bid 1, oxycodone (1)  Outpatient total Score:  1  -Individual Medications (acb score): hydrocodone 1    4. Transitions of Care  A member of our team will attempt to connect with a physician/APP at the receiving institution if the patient discharges with post-acute services.   Please review the following for recommendations that would helpful in your last several days of notes and the discharge summary to supplement these phone calls:    Recurrent SBO  - Management per primary team   - Parenteral nutrition (7/13 - 7/16)     HFpEF  HTN  - PTA: po torsemide 20 mg qday, metoprolol 25 mg qday, losartan 25 mg qday  - Current regimen: iv metoprolol 2.5 mg q6h     COPD  Chronic hypoxemic respiratory failure   - PTA: trelegy 1 puff qday, albuterol-ipratropium nebulizer 3mL bid prn, albuterol 2 puffs q6h prn   - Current regimen: fluticasone + umeclidinium + vilanterol 1 puff qday + albuterol 2 puffs q4h prn     T2DM  - PTA metformin 1000 mg qday + SSI   - Current regimen: [facility]      Chronic anemia   Anemia likely from blood loss   -  Baseline ~ 10.5  - Iron panel (03/02/23) Mixed iron deficiency and ACD                Iron 34               % saturation 19               TIBC 177               Ferritin 287  - Vitamin B12 (03/02/23) 576  - Deferring transfusion of iv iron at this time due to limited data for benefit in HFpEF. Most studies conducted in patients with EF < 45%     HLD     Calcified subpleural pulmonary nodule   - Requires evaluation, possible bronchoscopy     5. Goal of treatment   Pt would like to dc home with family support.       Interval Events  Patient resting in bed on exam. Reports abdominal pain, I think I need to have a bowel movement. Denies nausea or vomiting. Reports her last BM was yesterday. Has transitioned to regular diet PO. Tolerating well; reports she ate half of her breakfast - eggs, toast and bacon. Husband at bedside, reports swelling to BLE has increased. Continues on Lasix Q6 hours. Remains on O2 via NC; reports occasional dyspnea. Reports she uses walker and scooter at home and that her goal is to return home with husband. Care of pt discussed with nursing who reports pt had multiple bowel movements yesterday.     Summary of recent therapy notes: Active transfer to chair with assist x2 using walker. Limited by: pain, weakness.     Relevant medication changes since last visit: Reviewed       REVIEW OF SYSTEMS  Reports abdominal pain, denies nausea or vomiting    PHYSICAL EXAM  Vitals:    03/06/23 2136 03/07/23 0038 03/07/23 0232 03/07/23 0526   BP:  113/62 90/67 119/65   BP Source:   Arm, Left Lower Arm, Left Lower   Pulse: 90 93 97 95   Temp:   36.8 ?C (98.2 ?F) 36.5 ?C (97.7 ?F)   SpO2: 96%  96% 98%   O2 Device: Nasal cannula  Nasal cannula Nasal cannula   O2 Liter Flow: 4 Lpm  4 Lpm 4 Lpm   Weight:    100.7 kg (222 lb 0.1 oz)   Height:         General: NAD  HEENT: MMM; normocephalic, atraumatic  Abdomen: soft, non-tender to palpation  Lungs: O2 via NC; unlabored breathing  Extremities: 2+ edema all extremities   Neuro: alert, clear speech   - UB2: day of week Yes   months backwards Yes   Psych: calm, cooperative   Skin: warm and dry     Labs  CBC w diff    Lab Results   Component Value Date/Time    WBC 9.5 03/07/2023 06:24 AM    RBC 3.04 (L) 03/07/2023 06:24 AM    HGB 9.3 (L) 03/07/2023 06:24 AM    HCT 28.8 (L) 03/07/2023 06:24 AM    MCV 94.9 03/07/2023 06:24 AM    MCH 30.8 03/07/2023 06:24 AM    MCHC 32.4 03/07/2023 06:24 AM    RDW 20.8 (H) 03/07/2023 06:24 AM    PLTCT 405 (H) 03/07/2023 06:24 AM    MPV 7.4 03/07/2023 06:24 AM    Lab Results   Component Value Date/Time    NEUT 54 06/02/2009 11:27 AM  ANC 2.61 06/02/2009 11:27 AM    LYMA 29 06/02/2009 11:27 AM    ALC 1.38 06/02/2009 11:27 AM    MONA 10 06/02/2009 11:27 AM    AMC 0.48 06/02/2009 11:27 AM    EOSA 6 (H) 06/02/2009 11:27 AM    AEC 0.29 06/02/2009 11:27 AM    BASA 1 06/02/2009 11:27 AM    ABC 0.03 06/02/2009 11:27 AM        Basic Metabolic Profile    Lab Results   Component Value Date/Time    NA 135 (L) 03/07/2023 06:24 AM    K 4.4 03/07/2023 06:24 AM    CA 8.3 (L) 03/07/2023 06:24 AM    CL 104 03/07/2023 06:24 AM    CO2 21 03/07/2023 06:24 AM    GAP 10 03/07/2023 06:24 AM    Lab Results   Component Value Date/Time    BUN 18 03/07/2023 06:24 AM    CR 0.69 03/07/2023 06:24 AM    GLU 151 (H) 03/07/2023 06:24 AM           Radiology  03/06/2023 - CHEST SINGLE VIEW     INDICATION: Worsening oxygenation     COMPARISON STUDY: 03/05/2023.     FINDINGS:     Support Devices: The support devices are stable. Epicardial pacer wires.     Lungs/Pleura: Slightly decreased lung volume. Similar peripheral right   upper lobe opacities. Indistinct pulmonary vasculature with slightly   increased perihilar and bibasilar opacities. Small left pleural effusion.   No pneumothorax.     Heart and Mediastinum: The cardiomediastinal silhouette is stable.     IMPRESSION     1. Decreased lung volume with slightly increased perihilar and bibasilar   opacities and indistinct pulmonary vasculature, likely a combination of   atelectasis and pulmonary edema.   2.  Small left pleural effusion.   3.  Peripheral right upper lobe opacity with underlying emphysema and   fibrosis better evaluated on recent CT chest.     By my electronic signature, I attest that I have personally reviewed the   images for this examination and formulated the interpretations and   opinions expressed in this report        Finalized by Tiburcio Fly, MD on 03/06/2023 8:21 AM. Dictated by Steva Ek, M.D. on 03/06/2023 8:12 AM.       Total Time Today was 35 minutes in the following activities: Preparing to see the patient, Performing a medically appropriate examination and/or evaluation, Counseling and educating the patient/family/caregiver, Referring and communication with other health care professionals (when not separately reported), and Documenting clinical information in the electronic or other health record

## 2023-03-07 NOTE — Progress Notes
 I have reviewed the notes, assessment, and/or procedures performed by Erick, RN and concur with his documentation unless otherwise noted.

## 2023-03-07 NOTE — Progress Notes
 RT exercise oximetry ordered, patient informed me that she 'does not walk at all' due to broken hip and refused test at this time. Let primary team know.

## 2023-03-07 NOTE — Progress Notes
 Nutrition Support Service (NSS) Sign off Note    Name: Amy Bowman   MRN: [id number]     DOB: [birth year]      Age: 82 y.o.  Admission Date: 02/24/2023     LOS: 11 days     Date of Service: 03/07/2023      Subjective: pt tolerated an egg, toast and milk for breakfast, feeling well afterwards. Last pm, tolerated broth and juice for dinner    Current Diet Order: Regular (7/17)     Admit Weight (Kg): Weight: 96.3 kg (212 lb 4.9 oz)   Current Weight (Kg): Weight: 100.7 kg (222 lb 0.1 oz) (7/13 last filed weight)  TPN Dose Weight (Kg): 56kg    Estimated Kcal Needs: 1400-1680 (25-30 kcal/kg DBW 56 kg)   Estimated Protein Needs: 67-84 (1.2-1.5 g/kg DBW 56 kg)      Malnutrition Details:  Malnutrition present  ICD-10 code E44: Acute illness/Moderate non-severe malnutrition  Energy Intake: Less than 75% of estimated energy requirement for greater than 7days, Mild loss of muscle mass, Mild fluid accumulation            Loss of Subcutaneous Fat: Yes Mild Orbital  Muscle Wasting: Yes Mild Temple  Edema: Yes Mild Lower extremities        Malnutrition Interventions: Initiation of nutrition support    A/P: Pristine Gladhill is an 82 year old female with PMH of COPD, chronic respiratory failure (baseline O2 3 lpm), previous tobacco abuse, CAD s/p CABG, HTN, HLD, HFpEF, DM2 recent ventral hernia with secondary small bowel obstruction s/p ex lap with ventral hernia repair (4/24) now with recurrent SBO s/p ex-lap, SB resection, repair of serosal tear, and LOA (7/7). Pt attempted Regular diet post-op, yet developed abdominal pain and drainage from midline 7/12. CT showed possible ileus vs early post-op SBO.    PPN was started 7/13 given no central line access and pt with ongoing NPO. NGT removed last pm. Today pt is tolerating regular diet - spoke with MD, will d/c PPN after today's bag runs out.    NSS signing off to unit RD for further nutrition care.    Armenta Landau, MS, RD, LD, CNSC     Available on Voalte

## 2023-03-08 ENCOUNTER — Encounter: Admit: 2023-03-08 | Discharge: 2023-03-08 | Payer: MEDICARE

## 2023-03-08 ENCOUNTER — Inpatient Hospital Stay: Admit: 2023-02-24 | Payer: Medicare Other

## 2023-03-08 ENCOUNTER — Inpatient Hospital Stay: Admit: 2023-03-08 | Discharge: 2023-03-08 | Payer: MEDICARE

## 2023-03-08 DIAGNOSIS — Z9861 Coronary angioplasty status: Secondary | ICD-10-CM

## 2023-03-08 DIAGNOSIS — H547 Unspecified visual loss: Secondary | ICD-10-CM

## 2023-03-08 DIAGNOSIS — I110 Hypertensive heart disease with heart failure: Secondary | ICD-10-CM

## 2023-03-08 DIAGNOSIS — E871 Hypo-osmolality and hyponatremia: Secondary | ICD-10-CM

## 2023-03-08 DIAGNOSIS — Z83438 Family history of other disorder of lipoprotein metabolism and other lipidemia: Secondary | ICD-10-CM

## 2023-03-08 DIAGNOSIS — Z7982 Long term (current) use of aspirin: Secondary | ICD-10-CM

## 2023-03-08 DIAGNOSIS — Z7984 Long term (current) use of oral hypoglycemic drugs: Secondary | ICD-10-CM

## 2023-03-08 DIAGNOSIS — G9349 Other encephalopathy: Secondary | ICD-10-CM

## 2023-03-08 DIAGNOSIS — Z66 Do not resuscitate: Secondary | ICD-10-CM

## 2023-03-08 DIAGNOSIS — J9610 Chronic respiratory failure, unspecified whether with hypoxia or hypercapnia: Secondary | ICD-10-CM

## 2023-03-08 DIAGNOSIS — Z87891 Personal history of nicotine dependence: Secondary | ICD-10-CM

## 2023-03-08 DIAGNOSIS — E440 Moderate protein-calorie malnutrition: Secondary | ICD-10-CM

## 2023-03-08 DIAGNOSIS — J4489 Other specified chronic obstructive pulmonary disease (CMS-HCC): Secondary | ICD-10-CM

## 2023-03-08 DIAGNOSIS — M9702XA Periprosthetic fracture around internal prosthetic left hip joint, initial encounter: Secondary | ICD-10-CM

## 2023-03-08 DIAGNOSIS — Z6841 Body Mass Index (BMI) 40.0 and over, adult: Secondary | ICD-10-CM

## 2023-03-08 DIAGNOSIS — K565 Intestinal adhesions [bands], unspecified as to partial versus complete obstruction: Secondary | ICD-10-CM

## 2023-03-08 DIAGNOSIS — I2510 Atherosclerotic heart disease of native coronary artery without angina pectoris: Secondary | ICD-10-CM

## 2023-03-08 DIAGNOSIS — S32592A Other specified fracture of left pubis, initial encounter for closed fracture: Secondary | ICD-10-CM

## 2023-03-08 DIAGNOSIS — S32402A Unspecified fracture of left acetabulum, initial encounter for closed fracture: Secondary | ICD-10-CM

## 2023-03-08 DIAGNOSIS — S32401A Unspecified fracture of right acetabulum, initial encounter for closed fracture: Secondary | ICD-10-CM

## 2023-03-08 DIAGNOSIS — D62 Acute posthemorrhagic anemia: Secondary | ICD-10-CM

## 2023-03-08 DIAGNOSIS — E8721 Acute metabolic acidosis: Secondary | ICD-10-CM

## 2023-03-08 DIAGNOSIS — Z8551 Personal history of malignant neoplasm of bladder: Secondary | ICD-10-CM

## 2023-03-08 DIAGNOSIS — J9621 Acute and chronic respiratory failure with hypoxia: Secondary | ICD-10-CM

## 2023-03-08 DIAGNOSIS — E119 Type 2 diabetes mellitus without complications: Secondary | ICD-10-CM

## 2023-03-08 DIAGNOSIS — Z951 Presence of aortocoronary bypass graft: Secondary | ICD-10-CM

## 2023-03-08 DIAGNOSIS — Z823 Family history of stroke: Secondary | ICD-10-CM

## 2023-03-08 DIAGNOSIS — Z8261 Family history of arthritis: Secondary | ICD-10-CM

## 2023-03-08 DIAGNOSIS — Z809 Family history of malignant neoplasm, unspecified: Secondary | ICD-10-CM

## 2023-03-08 DIAGNOSIS — T8132XA Disruption of internal operation (surgical) wound, not elsewhere classified, initial encounter: Secondary | ICD-10-CM

## 2023-03-08 DIAGNOSIS — J439 Emphysema, unspecified: Secondary | ICD-10-CM

## 2023-03-08 DIAGNOSIS — E669 Obesity, unspecified: Secondary | ICD-10-CM

## 2023-03-08 DIAGNOSIS — Z79899 Other long term (current) drug therapy: Secondary | ICD-10-CM

## 2023-03-08 DIAGNOSIS — I5032 Chronic diastolic (congestive) heart failure: Secondary | ICD-10-CM

## 2023-03-08 DIAGNOSIS — M9701XA Periprosthetic fracture around internal prosthetic right hip joint, initial encounter: Secondary | ICD-10-CM

## 2023-03-08 LAB — CBC
MPV: 7.1 FL (ref 7–11)
PLATELET COUNT: 385 10*3/uL (ref 150–400)
RBC COUNT: 2.7 M/UL — ABNORMAL LOW (ref 4.0–5.0)
RDW: 20 % — ABNORMAL HIGH (ref >60)
WBC COUNT: 10 10*3/uL (ref 4.5–11.0)

## 2023-03-08 LAB — BASIC METABOLIC PANEL
BLD UREA NITROGEN: 18 mg/dL (ref >60)
CALCIUM: 8.3 mg/dL — ABNORMAL LOW (ref 8.5–10.6)
CREATININE: 0.8 mg/dL — ABNORMAL HIGH (ref 0.4–1.00)
GLUCOSE,PANEL: 90 mg/dL — ABNORMAL LOW (ref 70–100)
POTASSIUM: 4 MMOL/L — ABNORMAL LOW (ref 3.5–5.1)
SODIUM: 136 MMOL/L — ABNORMAL LOW (ref 137–147)

## 2023-03-08 LAB — POC GLUCOSE
POC GLUCOSE: 182 mg/dL — ABNORMAL HIGH (ref 70–100)
POC GLUCOSE: 112 mg/dL — ABNORMAL HIGH (ref 70–100)
POC GLUCOSE: 182 mg/dL — ABNORMAL HIGH (ref 70–100)

## 2023-03-08 LAB — PHOSPHORUS: PHOSPHORUS: 3.6 mg/dL — ABNORMAL LOW (ref 2.0–4.5)

## 2023-03-08 MED ORDER — OXYCODONE 5 MG PO TAB
5 mg | ORAL_TABLET | ORAL | 0 refills | 6.00000 days | Status: DC | PRN
Start: 2023-03-08 — End: 2023-03-16
  Filled 2023-03-08: qty 15, 4d supply, fill #1

## 2023-03-08 MED ORDER — METHOCARBAMOL 750 MG PO TAB
750 mg | ORAL_TABLET | Freq: Two times a day (BID) | ORAL | 0 refills | 10.00000 days | Status: AC
Start: 2023-03-08 — End: ?
  Filled 2023-03-08: qty 15, 8d supply, fill #1

## 2023-03-08 MED ORDER — POLYETHYLENE GLYCOL 3350 17 GRAM PO PWPK
17 g | Freq: Every day | ORAL | 0 refills | 22.00000 days | Status: DC
Start: 2023-03-08 — End: 2023-10-02

## 2023-03-08 MED ORDER — ACETAMINOPHEN 325 MG PO TAB
1000 mg | ORAL_TABLET | ORAL | 0 refills | 8.00000 days | Status: DC | PRN
Start: 2023-03-08 — End: 2023-03-08

## 2023-03-08 MED ORDER — POLYETHYLENE GLYCOL 3350 17 GRAM PO PWPK
17 g | PACK | Freq: Every day | ORAL | 0 refills | 22.00000 days | Status: DC
Start: 2023-03-08 — End: 2023-03-08

## 2023-03-08 MED ORDER — ACETAMINOPHEN 325 MG PO TAB
1000 mg | ORAL | 0 refills | 8.00000 days | Status: DC | PRN
Start: 2023-03-08 — End: 2023-03-16

## 2023-03-08 NOTE — Progress Notes
 Summary of Today's Recommendations:  > - would implement delirium precautions with focus on treating pain and allowing sufficient time for uninterrupted sleep   - would continue to monitor volume status closely  - pt requesting to go home, would request repeat BMP and Mg level one week after dc by home health with results called to PCP  - would recommend dc of methocarbamol and initiation of 2mg  tizanidine PRN q 8 hours for muscle spasms only   > Recommend buspar 5 mg bid for anxiety symptoms, instead of hydroxyzine, could also consider prn 25 mg trazodone q 6 hrs, would not dc with hydroxyzine   > Recommend switching from [facility] to [facility]     Assessment and Plan     1. Mentation  1. Mentation  At risk for delirium   Delirium screen: Negative   Dementia screen: Negative  Depression screen: Positive  Family reports recurrent encephalopathy 3 days post-op after most procedures. They are concerned it is related to po pain medications, which will attempt to evaluate now that she will be NPO again. Discussed delirium precautions, interventions they can use, and risk of delirium from meds as well as from pain. Will monitor and provide supportive care.     With significant improvement today. Would be cautious when restarting home meds      2. Mobility  Frailty   L acetabular fracture   FRAIL score: 4 (Robust = 0, Prefrail = 1-2, Frail = 3-5)  Recommended inpatient fall prevention strategies- progressive mobility, NWB LLE, WB for transfers only   Tethers- minimize as able  Baseline functional status: Independent with basic ADLs; dependent with instrumental ADLs   Prior living situation: Home with spouse      - please include weight bearing status and any needed ortho f/u on dc orders      3. Medication  Anticholinergic Burden Calculator Score ([email or web address].es): 3+ suggests higher risk of falls, memory issues, and related complications  Inpatient Total Score: 6  -Individual medications (acb score): methocarbamol 500 mg q8h 3, hydroxyzine 50 mg tid prn 1, pantoprazole 40 mg bid 1, oxycodone (1)  Outpatient total Score:  1  -Individual Medications (acb score): hydrocodone 1     4. Transitions of Care  A member of our team will attempt to connect with a physician/APP at the receiving institution if the patient discharges with post-acute services.   Please review the following for recommendations that would helpful in your last several days of notes and the discharge summary to supplement these phone calls:     Recurrent SBO  - Management per primary team   - Parenteral nutrition (7/13 - 7/16)  - please include any needed f/u on dc orders      HFpEF  HTN  - PTA: po torsemide 20 mg qday, metoprolol 25 mg qday, losartan 25 mg qday  - would include plan for resumption of home meds on dc orders      COPD  Chronic hypoxemic respiratory failure   - PTA: trelegy 1 puff qday, albuterol-ipratropium nebulizer 3mL bid prn, albuterol 2 puffs q6h prn   - Current regimen: fluticasone + umeclidinium + vilanterol 1 puff qday + albuterol 2 puffs q4h prn     T2DM  - PTA metformin 1000 mg qday + SSI   - Current regimen: [facility]      Chronic anemia   Anemia likely from blood loss   - Baseline ~ 10.5  - Iron panel (03/02/23) Mixed iron deficiency and ACD  Iron 34               % saturation 19               TIBC 177               Ferritin 287  - Vitamin B12 (03/02/23) 576  - Deferring transfusion of iv iron at this time due to limited data for benefit in HFpEF. Most studies conducted in patients with EF < 45%     HLD    Hyponatremia  - would include repeat BMP on dc with home health with results called to PCP      Calcified subpleural pulmonary nodule   - Requires evaluation, possible bronchoscopy      5. Goal of treatment   Pt would like to dc home with family support.       Interval Events    Pt sitting on edge of bed on exam. Reports soa and continued BLE and BUE edema. Reports abd pain. Last BM yesterday morning. Continue to pass gas with no difficulty. Eating and drinking small amounts. Reviewed need for close f/u with her PCP. Message left for nurse with Dr. Herlinda Long office. Discussed with pt and husband at bedside that he would be able to provide consistent assistance to pt at home.     Summary of recent therapy notes: pt assist x2 to transfer to chair, therapy limited by pain and weakness     Relevant medication changes since last visit: reviewed       REVIEW OF SYSTEMS  Reports abd pain and soa     PHYSICAL EXAM  Vitals:    03/07/23 1801 03/07/23 2034 03/07/23 2116 03/08/23 0455   BP: (!) 148/77  137/84 107/46   BP Source: Arm, Left Lower   Arm, Left Lower   Pulse: 103  90 89   Temp: 36.4 ?C (97.6 ?F)  36.6 ?C (97.8 ?F) 36.5 ?C (97.7 ?F)   SpO2: 96% 94% 95% 96%   O2 Device: Nasal cannula Nasal cannula Nasal cannula Nasal cannula   O2 Liter Flow: 4 Lpm 4 Lpm 4 Lpm 4 Lpm   Weight:       Height:         General: NAD  HEENT: MMM  Lungs: even, unlabored breathing  Extremities: +2 BLE edema, + 1 BUE edema   Neuro: alert, clear speech   Psych: calm  Skin: warm and dry    Labs  CBC w diff    Lab Results   Component Value Date/Time    WBC 10.4 03/08/2023 06:44 AM    RBC 2.79 (L) 03/08/2023 06:44 AM    HGB 8.5 (L) 03/08/2023 06:44 AM    HCT 26.3 (L) 03/08/2023 06:44 AM    MCV 94.5 03/08/2023 06:44 AM    MCH 30.4 03/08/2023 06:44 AM    MCHC 32.1 03/08/2023 06:44 AM    RDW 20.9 (H) 03/08/2023 06:44 AM    PLTCT 385 03/08/2023 06:44 AM    MPV 7.1 03/08/2023 06:44 AM    Lab Results   Component Value Date/Time    NEUT 54 06/02/2009 11:27 AM    ANC 2.61 06/02/2009 11:27 AM    LYMA 29 06/02/2009 11:27 AM    ALC 1.38 06/02/2009 11:27 AM    MONA 10 06/02/2009 11:27 AM    AMC 0.48 06/02/2009 11:27 AM    EOSA 6 (H) 06/02/2009 11:27 AM    AEC 0.29 06/02/2009 11:27 AM  BASA 1 06/02/2009 11:27 AM    ABC 0.03 06/02/2009 11:27 AM        Basic Metabolic Profile    Lab Results   Component Value Date/Time    NA 136 (L) 03/08/2023 06:44 AM    K 4.0 03/08/2023 06:44 AM    CA 8.3 (L) 03/08/2023 06:44 AM    CL 102 03/08/2023 06:44 AM    CO2 23 03/08/2023 06:44 AM    GAP 11 03/08/2023 06:44 AM    Lab Results   Component Value Date/Time    BUN 18 03/08/2023 06:44 AM    CR 0.82 03/08/2023 06:44 AM    GLU 90 03/08/2023 06:44 AM           Radiology  No recent imaging     Total Time Today was 50 minutes in the following activities: Preparing to see the patient, Performing a medically appropriate examination and/or evaluation, Counseling and educating the patient/family/caregiver, Referring and communication with other health care professionals (when not separately reported), Documenting clinical information in the electronic or other health record, and Care coordination (not separately reported)

## 2023-03-08 NOTE — Discharge Instructions - Pharmacy
 Discharge Summary      Name: Amy Bowman  Medical Record Number: [id number]        Account Number:  [id number]  Date Of Birth:  10-12-1940                         Age:  82 y.o.  Admit date:  02/24/2023                     Discharge date: 03/08/2023      Discharge Attending:  Micael Adas  Discharge Summary Completed By: Justina Oman, MD    Service: Surgery- Emergency General [phone]    Reason for hospitalization:  SBO (small bowel obstruction) (HCC) [K56.609]    Primary Discharge Diagnosis:   SBO (small bowel obstruction) [facility])    Hospital Diagnoses:  Hospital Problems        Active Problems    * (Principal) SBO (small bowel obstruction) (HCC)    Dehiscence of closure of fascia, superficial or muscular, subsequent   encounter     Present on Admission:   SBO (small bowel obstruction) (HCC)        Significant Past Medical History        Abdominal wall hernia  Arthritis  Asthma  Back pain  CAD (coronary artery disease)  Diabetes (HCC)  Emphysema  HLD (hyperlipidemia)  HTN (hypertension)  Hyperlipemia  Memory difficulties      Comment:  has a hard time remembering new information or changes                in medication/health care needs  Near-syncope  Obesity  Personal history of CABG (coronary artery bypass graft)  Polypharmacy  S/P hernia surgery  Small bowel obstruction (HCC)  Type II diabetes mellitus (HCC)  Vision decreased    Allergies   Celebrex [celecoxib] and Vioxx [rofecoxib]    Brief Hospital Course   39F w/ PMH of COPD, chronic respiratory failure (baseline O2 3 lpm), previous tobacco abuse, CAD s/p CABG, HTN, HLD, HFpEF, DM2 recent ventral hernia w/ SBO s/p ex lap with ventral hernia repair (4/24) two recent SBO presented to [facility] with sbo recurrent SBO 7/6. She went to the OR 7/7 with Dr. Arlene Ben for exploration and small bowel resection. Her post-operative course was complicated by dehiscence and went back to the OR 7/13 with Dr. Katheryne Pane. Patient progressed appropriate post-operative. At time of discharge 7/18, patient was tolerating regular diet, she was cleared for home with supervision by PT and she was having bowel movements. Patient was on home oxygen at time of discharge. Patient will follow up with [facility] in the coming weeks.     Items Needing Follow Up   Pending items or areas that need to be addressed at follow up: wound check and surgical pathology    Pending Labs and Follow Up Radiology    Pending labs and/or radiology review at this time of discharge are listed below: if this area is blank, there are no items for review.     CT ABD/PELV W CONTRAST, CT CHEST W CONTRAST  CT ABD/PELV W CONTRAST, CT CHEST W CONTRAST    Medications      Medication List      START taking these medications     methocarbamoL 750 mg tablet; Commonly known as: ROBAXIN; Dose: 750 mg;   Take one tablet by mouth twice daily. Indications: muscle spasm; For:   muscle spasm; Quantity: 15 tablet; Refills: 0  oxyCODONE 5 mg tablet; Commonly known as: ROXICODONE; Dose: 5 mg; Take   one tablet by mouth every 6 hours as needed. Indications: pain; For: pain;   Quantity: 15 tablet; Refills: 0     CHANGE how you take these medications     acetaminophen 325 mg tablet; Commonly known as: MAPAP (acetaminophen);   Dose: 975 mg; Take three tablets by mouth every 8 hours as needed for   Pain. Indications: pain; For: pain; Quantity: 25 tablet; Refills: 0; What   changed: how much to take, when to take this   atorvastatin 40 mg tablet; Commonly known as: LIPITOR; Dose: 40 mg; TAKE   ONE TABLET BY MOUTH DAILY. INDICATIONS: HIGH CHOLESTEROL AND HIGH   TRIGLYCERIDES; Quantity: 90 tablet; Refills: 3; What changed: when to take   this   * polyethylene glycol 3350 17 g packet; Commonly known as: MIRALAX;   Dose: 17 g; Refills: 0; What changed: Another medication with the same   name was added. Make sure you understand how and when to take each.   * polyethylene glycol 3350 17 g packet; Commonly known as: MIRALAX;   Dose: 17 g; Take one packet by mouth daily. Indications: constipation;   For: constipation; Quantity: 12 packet; Refills: 0; What changed: You were   already taking a medication with the same name, and this prescription was   added. Make sure you understand how and when to take each.  * This list has 2 medication(s) that are the same as other medications   prescribed for you. Read the directions carefully, and ask your doctor or   other care provider to review them with you.     CONTINUE taking these medications     albuterol sulfate 90 mcg/actuation HFA aerosol inhaler; Commonly known   as: PROAIR HFA; Dose: 2 puff; Refills: 0   albuterol-ipratropium 0.5 mg-3 mg(2.5 mg base)/3 mL nebulizer solution;   Commonly known as: DUONEB; Dose: 3 mL; Refills: 0   aspirin 81 mg Cap; Dose: 1 tablet; Refills: 0   BIOTIN PO; Dose: 1 tablet; Refills: 0   dexlansoprazole 30 mg capsule; Commonly known as: DEXILANT; Dose: 30 mg;   Refills: 0   diclofenac sodium 1 % topical gel; Commonly known as: VOLTAREN; Refills:   0   ferrous sulfate 325 mg (65 mg iron) tablet; Commonly known as: FEOSOL;   Dose: 325 mg; Refills: 0   fluticasone propionate 50 mcg/actuation nasal spray, suspension;   Commonly known as: FLONASE; Dose: 1 spray; Refills: 11   gabapentin 100 mg capsule; Commonly known as: NEURONTIN; Dose: 200 mg;   Take two capsules by mouth three times daily.; Quantity: 180 capsule;   Refills: 1   insulin lispro protamin-lispro 100 unit/mL (75/25) injection suspension;   Commonly known as: HUMALOG MIX 75-25(U-100)INSULN; Refills: 0   lidocaine 5 % topical patch; Commonly known as: LIDODERM; Dose: 1 patch;   Apply one patch topically to affected area daily. Apply patch to Right   shoulder for 12 hours, then remove for 12 hours before repeating.;   Quantity: 90 patch; Refills: 1   losartan 25 mg tablet; Commonly known as: COZAAR; Dose: 25 mg; Take one   tablet by mouth daily.; Quantity: 90 tablet; Refills: 3   magnesium oxide 400 mg (241.3 mg magnesium) tablet; Commonly known as: MAGOX; Dose: 400 mg; Refills: 0   metFORMIN-XR 500 mg extended release tablet; Commonly known as:   GLUCOPHAGE XR; Dose: 1,000 mg; Refills: 0   metoprolol succinate XL  25 mg extended release tablet; Commonly known   as: TOPROL XL; TAKE 1 TABLET BY MOUTH EVERY DAY; Quantity: 90 tablet;   Refills: 3   nystatin 100,000 unit/g topical powder; Commonly known as: NYSTOP;   Refills: 0   torsemide 20 mg tablet; Commonly known as: DEMADEX; Dose: 20 mg;   Refills: 0   TRELEGY ELLIPTA 200-62.5-25 mcg inhaler; Generic drug:   fluticasone-umeclidin-vilanter; Dose: 1 puff; Inhale one puff by mouth   into the lungs daily.; Quantity: 60 each; Refills: 1   zafirlukast 20 mg tablet; Commonly known as: ACCOLATE; Dose: 1 tablet;   Refills: 0     STOP taking these medications     HYDROcodone/acetaminophen 7.5/325 mg tablet; Commonly known as: NORCO       Return Appointments and Scheduled Appointments     Scheduled appointments:      Mar 27, 2023 1:15 PM  Office visit with Marina  Arvella Bird, MD  Cardiovascular Medicine: [facility] (CVM Exam) [address]  [address]  [place] [address]  [phone]          Contact information for after-discharge care                [facility]       [facility]    Phone: [phone]    Fax: [phone]    Where: [address], [place] [address]    Service: Home Health Services                  Consults, Procedures, Diagnostics, Micro, Pathology   Consults: None  Surgical Procedures & Dates: exploration 7/7 and 7/13  Significant Diagnostic Studies, Micro and Procedures: noted in brief hospital course  Significant Pathology: noted in brief hospital course              Wound:      Wounds Moisture associated skin damage Anterior;Left;Proximal Thigh (Active)   02/24/23 1400   Wound Type: Moisture associated skin damage   Orientation: Anterior;Left;Proximal   Location: Thigh   Wound Location Comments:    Initial Wound Site Closure:    Initial Dressing Placed: Initial Cycle:    Initial Suction Setting (mmHg):    Pressure Injury Stages:    Pressure Injury Present Within 24 Hours of Hospital Admission:    If This Pressure Injury Is Suspected to Be Device Related, Please Select the Device::    Is the Wound Open or Closed:    Wound Assessment Moist;Pink 03/07/23 2116   Peri-wound Assessment Intact 03/07/23 2116   Wound Drainage Amount None 03/07/23 2116   Wound Dressing Status None/open to air 03/07/23 2116   Wound Care Dressing changed or new application 03/01/23 0515   Wound Dressing and/or Treatment A & D ointment 03/07/23 2116   Number of days: 12       Wounds Surgical incision Umbilicus (Active)   02/25/23 1318   Wound Type: Surgical incision   Orientation:    Location: Umbilicus   Wound Location Comments:    Initial Wound Site Closure: Staples   Initial Dressing Placed: Gauze;Hypafix tape   Initial Cycle:    Initial Suction Setting (mmHg):    Pressure Injury Stages:    Pressure Injury Present Within 24 Hours of Hospital Admission:    If This Pressure Injury Is Suspected to Be Device Related, Please Select the Device::    Is the Wound Open or Closed:    Wound Assessment Pink;Wound edges approximated  03/07/23 2116   Wound Site Closure Staples 03/07/23 2116   Peri-wound Assessment Dry;Intact 03/07/23 2116   Wound Drainage Amount None 03/07/23 2116   Wound Drainage Description Serosanguineous 03/04/23 0832   Wound Dressing Status Intact 03/07/23 2116   Wound Care Dressing changed or new application 03/05/23 2317   Wound Dressing and/or Treatment Dry gauze;Hypafix tape 03/05/23 2317   Number of days: 11                           Discharge Disposition, Condition   Patient Disposition: home  Condition at Discharge: Stable    Code Status     Code Status History       Date Active Date Inactive Code Status Order ID          02/24/2023 1420 03/06/2023 1241 Full Code [id number]  Feliberto Hopping, MD Inpatient    01/28/2023 1508 01/30/2023 1547 Full Code [id number]  Ricke Charleston, MD Inpatient    Only showing the last 2 code statuses.            Patient Instructions     Activity       Activity as Tolerated   As directed      It is important to keep increasing your activity level after you leave the hospital.  Moving around can help prevent blood clots, lung infection (pneumonia) and other problems.  Gradually increasing the number of times you are up moving around will help you return to your normal activity level more quickly.  Continue to increase the number of times you are up to the chair and walking daily to return to your normal activity level. Begin to work towards your normal activity level at discharge.    Lifting Restrictions   As directed      Do not lift more than 10 pounds for 6 weeks.          Diet       Regular Diet   As directed      You have no dietary restriction. Please continue with a healthy balanced diet.          Wounds/Lines/Drains       Incision Care   As directed      Standard *Keep your incision clean and dry.  *May shower following procedure. Avoid direct water contact to the incision. Take sponge baths, working around the incision during this time.  *Do not submerge incision in tub, pool, hot tub, or lake for 4 weeks.  *Avoid applying deodorants, powders, creams, lotions, etc, to your incision for 4 weeks.  *Usually there are no stitches to be removed. Steri-strips (strips of tape) will begin to fall off in 10-14 days. If they remain after 2 weeks, gently remove them when they are damp after a shower.  *Your incision should gradually look better each day. If you notice unusual swelling, redness, drainage, have increasing pain at the site, or have a fever greater than 100 degrees, notify your physician immediately.    You will need your staples removed at follow up appointment             Discharge education provided to patient., Signs and Symptoms:   Report these signs and symptoms       Discharge Signs/Symptoms   As directed      Standard Please contact your doctor if you have any of the following symptoms: temperature higher than 100.4 degrees F, uncontrolled pain, persistent  nausea and/or vomiting, difficulty breathing, chest pain, severe abdominal pain, unable to have bowel movement, or drainage with a foul odor        , Education: , and Others Instructions:   Other Orders       Questions About Your Stay   Complete by: As directed      Discharging attending physician: Shira Dopp A    Order comments: For questions or concerns regarding your hospital stay, call [phone].            Additional Orders: Case Management, Supplies, Home Health     Home Health/DME       None              Signed:  Justina Oman, MD  03/08/2023      cc:  Primary Care Physician:  Alfrieda Infante   Verified    Referring physicians:  Unknown, Unknown, MD   Additional provider(s):        Did we miss something? If additional records are needed, please fax a request on office letterhead to [phone]. Please include the patient's name, date of birth, fax number and type of information needed. Additional request can be made by email at [email or web address]. For general questions of information about electronic records sharing, call [phone].

## 2023-03-08 NOTE — Care Plan
 Problem: Discharge Planning  Goal: Participation in plan of care  Outcome: Goal Achieved  Goal: Knowledge regarding plan of care  Outcome: Goal Achieved  Goal: Prepared for discharge  Outcome: Goal Achieved     Problem: Harm to self, low risk of suicide  Goal: Absence of harm to self, low risk of suicide  Outcome: Goal Achieved     Problem: Moderate Fall Risk  Goal: Moderate Fall Risk  Outcome: Goal Achieved     Problem: Anxiety  Goal: Alleviation of anxiety  Outcome: Goal Achieved     Problem: Pain  Goal: Management of pain  Outcome: Goal Achieved  Goal: Knowledge of pain management  Outcome: Goal Achieved     Problem: Skin Integrity  Goal: Healing of skin (Wound & Incision)  Outcome: Goal Achieved  Goal: Skin integrity intact  Outcome: Goal Achieved  Goal: Healing of skin (Pressure Injury)  Outcome: Goal Achieved     Problem: Nutrition Deficit  Goal: Adequate nutritional intake  Outcome: Goal Achieved     Problem: High Fall Risk  Goal: High Fall Risk  Outcome: Goal Achieved

## 2023-03-08 NOTE — Case Management (ED)
 Case Management Progress Note    NAME:Amy Bowman                          MRN: [id number]              DOB:03-Jan-1941          AGE: 82 y.o.  ADMISSION DATE: 02/24/2023             DAYS ADMITTED: LOS: 12 days      Today's Date: 03/08/2023    PLAN: [facility]    Expected Discharge Date: 03/08/2023 12:00 PM  Is Patient Medically Stable: Yes   Are there Barriers to Discharge? no    INTERVENTION/DISPOSITION:  Discharge Planning              Discharge Planning: Home Health    NCM reviewed EMR/attended team huddle  Met with pt to discuss post-acute services recommended. Provided choice list with quality data from Medicare.gov Compare and offered to answer questions. Patient selected the following: [facility]. Patient accepted  NCM to fax [facility] orders/AVS   Updated [facility] intake  Transport with spouse Drexel Gentles  No new oxygen needs. Patient has home oxygen  NCM following as needed    Transportation              Does the Patient Need Case Management to Arrange Discharge Transport? (ex: facility, ambulance, wheelchair/stretcher, Medicaid, cab, other): No  Will the Patient Use Family Transport?: Yes  Transportation Name, Phone and Availability #1: [name], [name] [phone]  Support              Support: Pt/Family Updates re:POC or DC Plan, Patient Education, Huddle/team update  Info or Referral              Information or Referral to [facility]: No Needs Identified  Positive SDOH Domains and Potential Barriers                   Medication Needs              Medication Needs: No Needs Identified                                                                                                                                          Financial              Financial: No Needs Identified  Legal              Legal: No Needs Identified  Other              Other/None: No needs identified  Discharge Disposition Selected Continued Care - Admitted Since 02/24/2023       [facility] Coordination complete.      Service Provider Selected Services Address Phone Fax Patient Preferred    [facility]  [facility] [address], [place] [address] [phone] --                      Gioia Laity, RN    Gioia Laity RN, BSN, NCM  Integrated Nurse Case Manager  Phone: [phone]  Pager: [phone]

## 2023-03-08 NOTE — Progress Notes
 Amy Bowman discharged on 03/08/2023.   Amy Bowman  Discharge instructions reviewed with patient and family.  Valuables returned:   ADL Belongings at Bedside: Eyeglasses/contacts (cell phone).  Home medications:    .  Functional assessment at discharge complete: Yes .    Pt dc via wheelchair w/ all belongings.

## 2023-03-08 NOTE — Progress Notes
 Daily Progress Note      Today's Date:  03/08/2023  Name:  Amy Bowman                       MRN:  [id number]   Admission Date: 02/24/2023 (LOS: 12 days)                     Assessment: Amy Bowman is an 82 year old female with PMH of COPD, chronic respiratory failure (baseline O2 3 lpm), previous tobacco abuse, CAD s/p CABG, HTN, HLD, HFpEF, DM2 recent ventral hernia with secondary small bowel obstruction s/p ex lap with ventral hernia repair (4/24) now with recurrent SBO s/p ex-lap, SB resection, repair of serosal tear, and LOA (7/7) c/b fascial dehiscence s/p re-exlap w/ fascial closure.      Plan:  - Regular diet   - transited to PO meds  - encourage IS use  - Ortho consulted for hip pain - NMB LLE  - BID PPI  - MMPC  - PTA meds as appropriate  - DVT ppx     Dispo: today contingent of ride  Discussed with Dr. Micael Adas  _____________________________________________________________________________    Subjective:  No acute events overnight. Feeling much better. Tolerating diet. Pain well tolerated. Denies N/V. Agreeable to d/c home with home health     Objective:  BP: (107-148)/(46-84)   Temp:  [36.3 ?C (97.4 ?F)-36.6 ?C (97.8 ?F)]   Pulse:  [89-107]   Respirations:  [16 PER MINUTE-22 PER MINUTE]   SpO2:  [94 %-100 %]   O2 Device: Nasal cannula  O2 Liter Flow: 4 Lpm  Body mass index is 44.84 kg/m?[name]    Lab Results   Component Value Date/Time    HGB 8.5 (L) 03/08/2023 06:44 AM    HCT 26.3 (L) 03/08/2023 06:44 AM    WBC 10.4 03/08/2023 06:44 AM    PLTCT 385 03/08/2023 06:44 AM    INR 1.2 01/28/2023 04:49 PM     Lab Results   Component Value Date/Time    NA 136 (L) 03/08/2023 06:44 AM    K 4.0 03/08/2023 06:44 AM    CL 102 03/08/2023 06:44 AM    CO2 23 03/08/2023 06:44 AM    BUN 18 03/08/2023 06:44 AM    CR 0.82 03/08/2023 06:44 AM    MG 1.7 03/08/2023 06:44 AM    PO4 3.6 03/08/2023 06:44 AM    CA 8.3 (L) 03/08/2023 06:44 AM     Lab Results   Component Value Date/Time    GLUPOC 182 (H) 03/07/2023 09:05 PM    GLUPOC 160 (H) 03/07/2023 04:28 PM    GLUPOC 196 (H) 03/07/2023 11:40 AM          Physical Exam  Constitutional:       Appearance: Normal appearance.   HENT:      Head: Normocephalic and atraumatic.      Nose: Nose normal.   Pulmonary:      Effort: Pulmonary effort is normal.   Abdominal:      General: There is mild distention      Palpations: Abdomen is soft.      Tenderness: appropriate TTP     Comments: Midline laparotomy with staples in place,    Musculoskeletal:         General: Normal range of motion.      Cervical back: Normal range of motion.   Skin:     General: Skin is warm  and dry.   Neurological:      General: No focal deficit present.      Mental Status: She is alert and oriented to person, place, and time.   Psychiatric:         Mood and Affect: Mood normal.         Behavior: Behavior normal.     ICD-10 code E44: Acute illness/Moderate non-severe malnutrition  Energy Intake: Less than 75% of estimated energy requirement for greater than 7days, Mild loss of muscle mass, Mild fluid accumulation        Loss of Subcutaneous Fat: Yes Mild Orbital  Muscle Wasting: Yes Mild Temple  Edema: Yes Mild Lower extremities  Malnutrition Interventions: Initiation of nutrition support    Active Wounds          Wounds Moisture associated skin damage Anterior;Left;Proximal Thigh (Active)   02/24/23 1400   Wound Type: Moisture associated skin damage   Orientation: Anterior;Left;Proximal   Location: Thigh   Wound Location Comments:    Initial Wound Site Closure:    Initial Dressing Placed:    Initial Cycle:    Initial Suction Setting (mmHg):    Pressure Injury Stages:    Pressure Injury Present Within 24 Hours of Hospital Admission:    If This Pressure Injury Is Suspected to Be Device Related, Please Select the Device::    Is the Wound Open or Closed:    Wound Assessment Moist;Pink 03/07/23 2116   Peri-wound Assessment Intact 03/07/23 2116   Wound Drainage Amount None 03/07/23 2116   Wound Dressing Status None/open to air 03/07/23 2116 Wound Care Dressing changed or new application 03/01/23 0515   Wound Dressing and/or Treatment A & D ointment 03/07/23 2116   Number of days: 12       Wounds Surgical incision Umbilicus (Active)   02/25/23 1318   Wound Type: Surgical incision   Orientation:    Location: Umbilicus   Wound Location Comments:    Initial Wound Site Closure: Staples   Initial Dressing Placed: Gauze;Hypafix tape   Initial Cycle:    Initial Suction Setting (mmHg):    Pressure Injury Stages:    Pressure Injury Present Within 24 Hours of Hospital Admission:    If This Pressure Injury Is Suspected to Be Device Related, Please Select the Device::    Is the Wound Open or Closed:    Wound Assessment Pink;Wound edges approximated 03/07/23 2116   Wound Site Closure Staples 03/07/23 2116   Peri-wound Assessment Dry;Intact 03/07/23 2116   Wound Drainage Amount None 03/07/23 2116   Wound Drainage Description Serosanguineous 03/04/23 0832   Wound Dressing Status Intact 03/07/23 2116   Wound Care Dressing changed or new application 03/05/23 2317   Wound Dressing and/or Treatment Dry gauze;Hypafix tape 03/05/23 2317   Number of days: 11                                              ___________________________    Evon Hoguet, MD

## 2023-03-09 ENCOUNTER — Encounter: Admit: 2023-03-09 | Discharge: 2023-03-09 | Payer: MEDICARE

## 2023-03-09 DIAGNOSIS — E782 Mixed hyperlipidemia: Secondary | ICD-10-CM

## 2023-03-09 MED ORDER — GABAPENTIN 100 MG PO CAP
200 mg | ORAL_CAPSULE | Freq: Three times a day (TID) | ORAL | 1 refills
Start: 2023-03-09 — End: ?

## 2023-03-09 MED ORDER — ATORVASTATIN 40 MG PO TAB
40 mg | ORAL_TABLET | Freq: Every day | ORAL | 3 refills | Status: AC
Start: 2023-03-09 — End: ?

## 2023-03-09 NOTE — Telephone Encounter
03/09/2023 9:50 AM     Received a request via computer from the patients pharmacy requesting a refill.  Script e-scribed as requested.       Pt due for FLP. Lab ordered. Will mail to patient to get done prior to upcoming OV 03/27/23.

## 2023-03-11 ENCOUNTER — Inpatient Hospital Stay: Admit: 2023-03-11 | Discharge: 2023-03-11 | Payer: MEDICARE

## 2023-03-11 ENCOUNTER — Inpatient Hospital Stay: Admit: 2023-03-11 | Payer: MEDICARE

## 2023-03-11 ENCOUNTER — Encounter: Admit: 2023-03-11 | Discharge: 2023-03-11 | Payer: MEDICARE

## 2023-03-11 DIAGNOSIS — K56609 Unspecified intestinal obstruction, unspecified as to partial versus complete obstruction: Secondary | ICD-10-CM

## 2023-03-11 LAB — IONIZED CALCIUM: IONIZED CALCIUM: 1 MMOL/L (ref 1.0–1.3)

## 2023-03-11 LAB — CBC: WBC COUNT: 7.7 10*3/uL (ref 4.5–11.0)

## 2023-03-11 MED ORDER — ENOXAPARIN 40 MG/0.4 ML SC SYRG
40 mg | Freq: Every day | SUBCUTANEOUS | 0 refills | Status: AC
Start: 2023-03-11 — End: ?
  Administered 2023-03-12 – 2023-03-14 (×3): 40 mg via SUBCUTANEOUS

## 2023-03-11 MED ORDER — METOPROLOL TARTRATE 5 MG/5 ML IV SOLN
2.5 mg | INTRAVENOUS | 0 refills | Status: AC
Start: 2023-03-11 — End: ?
  Administered 2023-03-12 – 2023-03-13 (×6): 2.5 mg via INTRAVENOUS

## 2023-03-11 MED ORDER — FENTANYL CITRATE (PF) 50 MCG/ML IJ SOLN
25 ug | INTRAVENOUS | 0 refills | Status: AC | PRN
Start: 2023-03-11 — End: ?
  Administered 2023-03-12 – 2023-03-13 (×5): 25 ug via INTRAVENOUS

## 2023-03-11 MED ORDER — ACETAMINOPHEN 1,000 MG/100 ML (10 MG/ML) IV SOLN
1000 mg | INTRAVENOUS | 0 refills | Status: AC
Start: 2023-03-11 — End: ?
  Administered 2023-03-12: 04:00:00 1000 mg via INTRAVENOUS

## 2023-03-11 MED ORDER — LACTATED RINGERS IV SOLP
INTRAVENOUS | 0 refills | Status: AC
Start: 2023-03-11 — End: ?
  Administered 2023-03-12 – 2023-03-13 (×4): 1000.000 mL via INTRAVENOUS

## 2023-03-11 MED ORDER — ONDANSETRON HCL (PF) 4 MG/2 ML IJ SOLN
4 mg | INTRAVENOUS | 0 refills | Status: AC | PRN
Start: 2023-03-11 — End: ?
  Administered 2023-03-15: 19:00:00 4 mg via INTRAVENOUS

## 2023-03-11 MED ORDER — FLUTICASONE FUROATE 200 MCG/ACTUATION IN DSDV
1 | Freq: Every day | RESPIRATORY_TRACT | 0 refills | Status: AC
Start: 2023-03-11 — End: ?
  Administered 2023-03-13 – 2023-03-14 (×2): 1 via RESPIRATORY_TRACT

## 2023-03-11 MED ORDER — MELATONIN 5 MG PO TAB
5 mg | Freq: Every evening | ORAL | 0 refills | Status: AC | PRN
Start: 2023-03-11 — End: ?
  Administered 2023-03-13 – 2023-03-16 (×3): 5 mg via ORAL

## 2023-03-11 MED ORDER — MAGNESIUM SULFATE IN WATER 4 GRAM/50 ML (8 %) IV PGBK
4 g | Freq: Once | INTRAVENOUS | 0 refills | Status: AC
Start: 2023-03-11 — End: ?
  Administered 2023-03-12: 04:00:00 4 g via INTRAVENOUS

## 2023-03-11 MED ORDER — IPRATROPIUM-ALBUTEROL 0.5 MG-3 MG(2.5 MG BASE)/3 ML IN NEBU
3 mL | Freq: Two times a day (BID) | RESPIRATORY_TRACT | 0 refills | Status: AC | PRN
Start: 2023-03-11 — End: ?
  Administered 2023-03-12: 15:00:00 3 mL via RESPIRATORY_TRACT

## 2023-03-11 MED ORDER — DIATRIZOATE MEG-DIATRIZOAT SOD 66-10 % PO SOLN
120 mL | Freq: Once | NASOGASTRIC | 0 refills | Status: AC
Start: 2023-03-11 — End: ?
  Administered 2023-03-12: 10:00:00 120 mL via NASOGASTRIC

## 2023-03-11 MED ORDER — UMECLIDINIUM-VILANTEROL 62.5-25 MCG/ACTUATION IN DSDV
1 | Freq: Every day | RESPIRATORY_TRACT | 0 refills | Status: AC
Start: 2023-03-11 — End: ?
  Administered 2023-03-13 – 2023-03-14 (×2): 1 via RESPIRATORY_TRACT

## 2023-03-11 MED ORDER — METHOCARBAMOL 100 MG/ML IJ SOLN
1000 mg | Freq: Once | INTRAVENOUS | 0 refills | Status: AC
Start: 2023-03-11 — End: ?
  Administered 2023-03-12: 06:00:00 1000 mg via INTRAVENOUS

## 2023-03-11 MED ORDER — METOPROLOL TARTRATE 5 MG/5 ML IV SOLN
2.5 mg | INTRAVENOUS | 0 refills | Status: DC
Start: 2023-03-11 — End: 2023-03-11

## 2023-03-11 MED ORDER — DEXTROSE 50 % IN WATER (D50W) IV SYRG
50 mL | Freq: Once | INTRAVENOUS | 0 refills | Status: CP
Start: 2023-03-11 — End: ?
  Administered 2023-03-12: 03:00:00 50 mL via INTRAVENOUS

## 2023-03-11 MED ORDER — NALOXONE 0.4 MG/ML IJ SOLN
.08 mg | INTRAVENOUS | 0 refills | Status: AC | PRN
Start: 2023-03-11 — End: ?

## 2023-03-11 MED ORDER — SODIUM PHOSPHATE IVPB
15 MMOL | Freq: Once | INTRAVENOUS | 0 refills | Status: AC
Start: 2023-03-11 — End: ?
  Administered 2023-03-12 (×2): 15 mmol via INTRAVENOUS

## 2023-03-11 MED ORDER — ONDANSETRON 4 MG PO TBDI
4 mg | ORAL | 0 refills | Status: AC | PRN
Start: 2023-03-11 — End: ?

## 2023-03-11 MED ORDER — ALBUTEROL SULFATE 90 MCG/ACTUATION IN HFAA
2 | RESPIRATORY_TRACT | 0 refills | Status: AC | PRN
Start: 2023-03-11 — End: ?

## 2023-03-11 NOTE — Progress Notes
VAT consulted for PIV placement.  On arrival to room, patient and family requesting a central line be placed.  They stated the last time the patient was admitted she was having to have IVs replaced daily.  Patient also stated, They say I don't have that much time left so if I don't I want to be as comfortable as possible.     Assessment of patient's bilateral upper extremities with sono reveals few small vessels.  VAT RN was able to obtain a 22 gauge, 1.75 inch PIV in the patient's right mid forearm.    Per the patient's family, the last time the patient had a central line placed she needed sedation due to anxiety.  VAT RN educated the family that the order for central line placement is made by the patient's primary physician.  VAT RN also educated the family and patient that if the patient were to have a central line ordered and would need sedation for it that it would not occur until Monday or Tuesday.  Patient verbalized understanding.

## 2023-03-11 NOTE — Progress Notes
82 yr old female  PMH: COPD, CAD, CABG, HTN, HLD, HF, DM, ventral hernia, SBO  D/c from [facility] 7/18- bowel surgery- recurrent SBO  ER with abd pain, diarrhea, N/V  CT abd/pelvis - obstructed again at site of incision, no hernia  NGT placed  Fluids/pain meds  Labs WNL  116/60, 78, 18, 96% on 2L ( baseline O2), 98.5  A&O  Reason for request: recent surgery at [facility]

## 2023-03-11 NOTE — Progress Notes
1845: attempted to call RN transfer report to [facility] and spoke with Danita, Licensed conveyancer. She is going to confer with Charge RN, NIcole, and get back. Arline Asp, Consulting civil engineer notified.

## 2023-03-12 ENCOUNTER — Inpatient Hospital Stay: Admit: 2023-03-12 | Discharge: 2023-03-11 | Payer: MEDICARE

## 2023-03-12 ENCOUNTER — Inpatient Hospital Stay: Admit: 2023-03-12 | Discharge: 2023-03-12 | Payer: MEDICARE

## 2023-03-12 ENCOUNTER — Encounter: Admit: 2023-03-12 | Discharge: 2023-03-12 | Payer: MEDICARE

## 2023-03-12 LAB — COMPREHENSIVE METABOLIC PANEL
ALBUMIN: 2.3 g/dL — ABNORMAL LOW (ref 3.5–5.0)
ALK PHOSPHATASE: 106 U/L (ref 25–110)
ALT: 11 U/L (ref 7–56)
ANION GAP: 9 (ref 3–12)
AST: 28 U/L (ref 7–40)
CO2: 23 MMOL/L (ref 21–30)
EGFR: >60 mL/min (ref >60)
SODIUM: 137 MMOL/L — ABNORMAL LOW (ref 137–147)
TOTAL BILIRUBIN: 0.3 mg/dL — ABNORMAL LOW (ref 0.2–1.3)
TOTAL PROTEIN: 5.4 g/dL — ABNORMAL LOW (ref 6.0–8.0)

## 2023-03-12 LAB — LACTIC ACID(LACTATE): LACTIC ACID: 0.7 MMOL/L — ABNORMAL LOW (ref 0.5–2.0)

## 2023-03-12 LAB — PTT (APTT): PTT: 26 s — ABNORMAL HIGH (ref 24.0–36.5)

## 2023-03-12 LAB — MAGNESIUM: MAGNESIUM: 1.5 mg/dL — ABNORMAL LOW (ref 1.6–2.6)

## 2023-03-12 LAB — PHOSPHORUS: PHOSPHORUS: 2.8 mg/dL — ABNORMAL LOW (ref 2.0–4.5)

## 2023-03-12 LAB — POC GLUCOSE
POC GLUCOSE: 53 mg/dL — ABNORMAL LOW (ref 70–100)
POC GLUCOSE: 53 mg/dL — ABNORMAL LOW (ref 70–100)
POC GLUCOSE: 75 mg/dL (ref 70–100)

## 2023-03-12 LAB — PROTIME INR (PT): PROTIME: 14 s — ABNORMAL HIGH (ref 10.2–12.9)

## 2023-03-12 MED ORDER — DEXTROSE 50 % IN WATER (D50W) IV SYRG
12.5-25 g | INTRAVENOUS | 0 refills | Status: AC | PRN
Start: 2023-03-12 — End: ?
  Administered 2023-03-12: 10:00:00 50 mL via INTRAVENOUS
  Administered 2023-03-12: 09:00:00 25 mL via INTRAVENOUS

## 2023-03-12 MED ORDER — INSULIN ASPART 100 UNIT/ML SC FLEXPEN
0-12 [IU] | Freq: Before meals | SUBCUTANEOUS | 0 refills | Status: AC
Start: 2023-03-12 — End: ?

## 2023-03-12 MED ADMIN — ACETAMINOPHEN 1,000 MG/100 ML (10 MG/ML) IV SOLN [305632]: 1000 mg | INTRAVENOUS | @ 17:00:00 | NDC 00264410090

## 2023-03-12 MED ADMIN — ACETAMINOPHEN 1,000 MG/100 ML (10 MG/ML) IV SOLN [305632]: 1000 mg | INTRAVENOUS | @ 11:00:00 | NDC 00264410090

## 2023-03-12 MED ADMIN — HYDROXYZINE HCL 10 MG PO TAB [3772]: 10 mg | ORAL | @ 22:00:00 | NDC 63739048310

## 2023-03-12 MED ADMIN — SODIUM CHLORIDE 0.9 % IV SOLP [27838]: 250 mL | INTRAVENOUS | @ 23:00:00 | Stop: 2023-03-12 | NDC 00338004902

## 2023-03-12 MED ADMIN — ACETAMINOPHEN 1,000 MG/100 ML (10 MG/ML) IV SOLN [305632]: 1000 mg | INTRAVENOUS | @ 22:00:00 | NDC 00264410090

## 2023-03-12 MED ADMIN — SODIUM CHLORIDE 0.9 % IV SOLP [27838]: 250 mL | INTRAVENOUS | @ 04:00:00 | Stop: 2023-03-12 | NDC 00338004902

## 2023-03-12 MED ADMIN — DIATRIZOATE MEG-DIATRIZOAT SOD 66-10 % PO SOLN [77945]: 120 mL | NASOGASTRIC | @ 11:00:00 | Stop: 2023-03-12 | NDC 00270044540

## 2023-03-12 NOTE — Progress Notes
RT Adult Assessment Note    NAME:Amy Bowman             MRN: [id number]             DOB:08-Sep-1940          AGE: 82 y.o.  ADMISSION DATE: 03/11/2023             DAYS ADMITTED: LOS: 0 days    Additional Comments:  Impressions of the patient: Pt resting in bed, NAD  Intervention(s)/outcome(s): NA  Patient education that was completed: NA  Recommendations to the care team: Continue RT protocol     Vital Signs:  Pulse: 99  RR: 22 PER MINUTE  SpO2: 100 %  O2 Device: Nasal cannula  Liter Flow: 3 Lpm  O2%:      Breath Sounds:      Respiratory Effort:      Comments:

## 2023-03-12 NOTE — Procedures
Vascular Access Team consulted for peripheral IV placement. Bilateral upper extremities were assessed with ultrasound, however unable to visualize medial vessels due to extremely limited mobility from previous shoulder injuries/surgeries. Patient also has limited vasculature outside of the bruising and swelling in both extremities. Unable to visualize vein for bedside PICC line placement.    Patient has a lot of anxiety and is now asking for a central line.  She is very tearful but willing to allow PIV placement. I was able to place a very high upper left cephalic PIV (20 g, 1.75) for an emergent use of D50. This VAT RN called and spoke to Dr. Paulette Blanch about the findings. I offered to place a request to IR for tunneled, non-cuffed CVC for tomorrow, as patient was just released approximately 2 days prior and her vasculature has not had adequate healing time. It would be in the patient's best interest to have IR place a central line for labs, medications, etc.                     As per previous documentation by other VAT providers from patient's previous stay, it is still the recommendation of this team that  the patient would benefit from a  tunneled CVC with sedation.

## 2023-03-13 MED ADMIN — ACETAMINOPHEN 1,000 MG/100 ML (10 MG/ML) IV SOLN [305632]: 1000 mg | INTRAVENOUS | @ 17:00:00 | Stop: 2023-03-13 | NDC 00264410090

## 2023-03-13 MED ADMIN — FUROSEMIDE 10 MG/ML IJ SOLN [3291]: 20 mg | INTRAVENOUS | @ 22:00:00 | Stop: 2023-03-14 | NDC 63323028001

## 2023-03-13 MED ADMIN — HYDROXYZINE HCL 10 MG PO TAB [3772]: 10 mg | ORAL | @ 02:00:00 | NDC 63739048310

## 2023-03-13 MED ADMIN — ACETAMINOPHEN 1,000 MG/100 ML (10 MG/ML) IV SOLN [305632]: 1000 mg | INTRAVENOUS | @ 11:00:00 | Stop: 2023-03-13 | NDC 00264410090

## 2023-03-13 MED ADMIN — ACETAMINOPHEN 1,000 MG/100 ML (10 MG/ML) IV SOLN [305632]: 1000 mg | INTRAVENOUS | @ 04:00:00 | NDC 00264410090

## 2023-03-13 MED ADMIN — HYDROXYZINE HCL 10 MG PO TAB [3772]: 10 mg | ORAL | @ 09:00:00 | Stop: 2023-03-13 | NDC 63739048310

## 2023-03-13 MED ADMIN — HYDROXYZINE HCL 25 MG PO TAB [3774]: 25 mg | ORAL | @ 11:00:00 | NDC 00904661761

## 2023-03-13 MED ADMIN — OXYCODONE 5 MG PO TAB [10814]: 5 mg | ORAL | @ 22:00:00 | NDC 00406055223

## 2023-03-14 MED ADMIN — ACETAMINOPHEN 500 MG PO TAB [102]: 1000 mg | ORAL | @ 04:00:00 | NDC 00904673061

## 2023-03-14 MED ADMIN — FUROSEMIDE 10 MG/ML IJ SOLN [3291]: 20 mg | INTRAVENOUS | @ 04:00:00 | Stop: 2023-03-14 | NDC 63323028001

## 2023-03-14 MED ADMIN — MAGNESIUM SULFATE IN D5W 1 GRAM/100 ML IV PGBK [166578]: 1 g | INTRAVENOUS | @ 23:00:00 | Stop: 2023-03-15 | NDC 00264440054

## 2023-03-14 MED ADMIN — FUROSEMIDE 10 MG/ML IJ SOLN [3291]: 40 mg | INTRAVENOUS | @ 18:00:00 | NDC 71288020304

## 2023-03-14 MED ADMIN — ACETAMINOPHEN 500 MG PO TAB [102]: 1000 mg | ORAL | @ 10:00:00 | NDC 00904673061

## 2023-03-14 MED ADMIN — ACETAMINOPHEN 500 MG PO TAB [102]: 1000 mg | ORAL | @ 18:00:00 | NDC 00904673061

## 2023-03-14 MED ADMIN — HYDROXYZINE HCL 25 MG PO TAB [3774]: 25 mg | ORAL | @ 04:00:00 | NDC 00904661761

## 2023-03-14 MED ADMIN — OXYCODONE 5 MG PO TAB [10814]: 5 mg | ORAL | @ 04:00:00 | NDC 00406055223

## 2023-03-14 MED ADMIN — HYDROXYZINE HCL 25 MG PO TAB [3774]: 25 mg | ORAL | @ 14:00:00 | NDC 00904661761

## 2023-03-14 MED ADMIN — OXYCODONE 5 MG PO TAB [10814]: 5 mg | ORAL | @ 23:00:00 | NDC 00406055223

## 2023-03-14 MED ADMIN — METOPROLOL TARTRATE 25 MG PO TAB [37637]: 12.5 mg | ORAL | @ 14:00:00 | NDC 62584026511

## 2023-03-14 MED ADMIN — METOPROLOL TARTRATE 25 MG PO TAB [37637]: 12.5 mg | ORAL | @ 02:00:00 | NDC 62584026511

## 2023-03-14 MED ADMIN — OXYCODONE 5 MG PO TAB [10814]: 5 mg | ORAL | @ 14:00:00 | NDC 00406055223

## 2023-03-15 ENCOUNTER — Inpatient Hospital Stay: Admit: 2023-03-15 | Discharge: 2023-03-15 | Payer: MEDICARE

## 2023-03-15 MED ADMIN — ENOXAPARIN 40 MG/0.4 ML SC SYRG [85052]: 40 mg | SUBCUTANEOUS | @ 15:00:00 | NDC 00781324602

## 2023-03-15 MED ADMIN — ACETAMINOPHEN 500 MG PO TAB [102]: 1000 mg | ORAL | @ 11:00:00 | NDC 00904673061

## 2023-03-15 MED ADMIN — MAGNESIUM SULFATE IN D5W 1 GRAM/100 ML IV PGBK [166578]: 1 g | INTRAVENOUS | @ 15:00:00 | Stop: 2023-03-15 | NDC 00264440054

## 2023-03-15 MED ADMIN — ACETAMINOPHEN 500 MG PO TAB [102]: 1000 mg | ORAL | @ 03:00:00 | NDC 00904673061

## 2023-03-15 MED ADMIN — METOPROLOL TARTRATE 25 MG PO TAB [37637]: 12.5 mg | ORAL | @ 15:00:00 | NDC 62584026511

## 2023-03-15 MED ADMIN — ACETAMINOPHEN 500 MG PO TAB [102]: 1000 mg | ORAL | @ 21:00:00 | NDC 00904673061

## 2023-03-15 MED ADMIN — FUROSEMIDE 10 MG/ML IJ SOLN [3291]: 40 mg | INTRAVENOUS | @ 15:00:00 | NDC 71288020304

## 2023-03-15 MED ADMIN — METOPROLOL TARTRATE 25 MG PO TAB [37637]: 12.5 mg | ORAL | @ 03:00:00 | NDC 62584026511

## 2023-03-15 MED ADMIN — OXYCODONE 5 MG PO TAB [10814]: 5 mg | ORAL | @ 21:00:00 | NDC 00406055223

## 2023-03-15 MED ADMIN — OXYCODONE 5 MG PO TAB [10814]: 5 mg | ORAL | @ 11:00:00 | NDC 00406055223

## 2023-03-15 MED ADMIN — ENOXAPARIN 40 MG/0.4 ML SC SYRG [85052]: 40 mg | SUBCUTANEOUS | @ 03:00:00 | NDC 00781324602

## 2023-03-15 MED ADMIN — HYDROXYZINE HCL 25 MG PO TAB [3774]: 25 mg | ORAL | @ 21:00:00 | NDC 00904661761

## 2023-03-15 MED ADMIN — POTASSIUM, SODIUM PHOSPHATES 280-160-250 MG PO PWPK [166235]: 2 | ORAL | @ 11:00:00 | Stop: 2023-03-15 | NDC 71351001099

## 2023-03-15 MED ADMIN — OXYCODONE 5 MG PO TAB [10814]: 5 mg | ORAL | @ 05:00:00 | NDC 00406055223

## 2023-03-15 MED ADMIN — HYDROXYZINE HCL 25 MG PO TAB [3774]: 25 mg | ORAL | @ 03:00:00 | NDC 00904661761

## 2023-03-16 ENCOUNTER — Encounter: Admit: 2023-03-16 | Discharge: 2023-03-16 | Payer: MEDICARE

## 2023-03-16 MED ADMIN — OXYCODONE 5 MG PO TAB [10814]: 5 mg | ORAL | @ 04:00:00 | NDC 00406055223

## 2023-03-16 MED ADMIN — ENOXAPARIN 40 MG/0.4 ML SC SYRG [85052]: 40 mg | SUBCUTANEOUS | @ 02:00:00 | NDC 00781324602

## 2023-03-16 MED ADMIN — FUROSEMIDE 10 MG/ML IJ SOLN [3291]: 40 mg | INTRAVENOUS | @ 14:00:00 | Stop: 2023-03-17 | NDC 71288020304

## 2023-03-16 MED ADMIN — ACETAMINOPHEN 500 MG PO TAB [102]: 1000 mg | ORAL | @ 14:00:00 | Stop: 2023-03-17 | NDC 00904673061

## 2023-03-16 MED ADMIN — METOPROLOL TARTRATE 25 MG PO TAB [37637]: 12.5 mg | ORAL | @ 14:00:00 | Stop: 2023-03-17 | NDC 62584026511

## 2023-03-16 MED ADMIN — HYDROXYZINE HCL 25 MG PO TAB [3774]: 25 mg | ORAL | @ 14:00:00 | Stop: 2023-03-17 | NDC 00904661761

## 2023-03-16 MED ADMIN — METOPROLOL TARTRATE 25 MG PO TAB [37637]: 12.5 mg | ORAL | @ 02:00:00 | NDC 62584026511

## 2023-03-16 MED ADMIN — OXYCODONE 5 MG PO TAB [10814]: 5 mg | ORAL | @ 14:00:00 | Stop: 2023-03-17 | NDC 00406055223

## 2023-03-16 MED ADMIN — ENOXAPARIN 40 MG/0.4 ML SC SYRG [85052]: 40 mg | SUBCUTANEOUS | @ 14:00:00 | Stop: 2023-03-17 | NDC 00781324602

## 2023-03-16 MED ADMIN — OXYCODONE 5 MG PO TAB [10814]: 5 mg | ORAL | @ 21:00:00 | Stop: 2023-03-17 | NDC 00406055223

## 2023-03-16 MED ADMIN — DOCUSATE SODIUM 100 MG PO CAP [2566]: 100 mg | ORAL | @ 14:00:00 | Stop: 2023-03-17 | NDC 00904718361

## 2023-03-16 MED ADMIN — PANTOPRAZOLE 20 MG PO TBEC [79463]: 20 mg | ORAL | @ 02:00:00 | NDC 50268058511

## 2023-03-16 MED ADMIN — ACETAMINOPHEN 500 MG PO TAB [102]: 1000 mg | ORAL | @ 21:00:00 | Stop: 2023-03-17 | NDC 00904673061

## 2023-03-16 MED ADMIN — ACETAMINOPHEN 500 MG PO TAB [102]: 1000 mg | ORAL | @ 05:00:00 | Stop: 2023-03-17 | NDC 00904673061

## 2023-03-18 ENCOUNTER — Encounter: Admit: 2023-03-18 | Discharge: 2023-03-18 | Payer: MEDICARE

## 2023-03-19 ENCOUNTER — Encounter: Admit: 2023-03-19 | Discharge: 2023-03-19 | Payer: MEDICARE

## 2023-03-20 ENCOUNTER — Encounter: Admit: 2023-03-20 | Discharge: 2023-03-20 | Payer: MEDICARE

## 2023-03-20 NOTE — Progress Notes
Transfer Center Clinical Updates: please see prior transfer center note for additional clinical details    Vomiting after meal this AM, getting KUB now.      NG w/ decompression not done, Gastrografin challenge cannot be done at referring facility. Could be done at [facility] but referring would want the patient transferred to where surgery was done.     VS: T 98.2 HR 99 RR 24 BP 117/71 SpO2 96% on 3L NC (baseline)     A&O     Labs: unremarkable

## 2023-03-22 ENCOUNTER — Inpatient Hospital Stay: Admit: 2023-03-22 | Discharge: 2023-03-22 | Payer: MEDICARE

## 2023-03-22 ENCOUNTER — Encounter: Admit: 2023-03-22 | Discharge: 2023-03-22 | Payer: MEDICARE

## 2023-03-22 MED ADMIN — METOPROLOL TARTRATE 5 MG/5 ML IV SOLN [5007]: 2.5 mg | INTRAVENOUS | @ 16:00:00 | NDC 72611074001

## 2023-03-22 MED ADMIN — FENTANYL CITRATE (PF) 50 MCG/ML IJ SOLN [3037]: 50 ug | INTRAVENOUS | NDC 00409909412

## 2023-03-22 MED ADMIN — UMECLIDINIUM-VILANTEROL 62.5-25 MCG/ACTUATION IN DSDV [320367]: 1 | RESPIRATORY_TRACT | @ 15:00:00 | NDC 00173086906

## 2023-03-22 MED ADMIN — FENTANYL CITRATE (PF) 50 MCG/ML IJ SOLN [3037]: 25 ug | INTRAVENOUS | @ 11:00:00 | Stop: 2023-03-22 | NDC 00409909412

## 2023-03-22 MED ADMIN — FENTANYL CITRATE (PF) 50 MCG/ML IJ SOLN [3037]: 25 ug | INTRAVENOUS | @ 15:00:00 | Stop: 2023-03-22 | NDC 00409909412

## 2023-03-22 MED ADMIN — METOPROLOL TARTRATE 5 MG/5 ML IV SOLN [5007]: 2.5 mg | INTRAVENOUS | @ 10:00:00 | NDC 72611074001

## 2023-03-22 MED ADMIN — ACETAMINOPHEN 1,000 MG/100 ML (10 MG/ML) IV SOLN [305632]: 1000 mg | INTRAVENOUS | @ 05:00:00 | NDC 63323043441

## 2023-03-22 MED ADMIN — FLUTICASONE FUROATE 200 MCG/ACTUATION IN DSDV [323654]: 1 | RESPIRATORY_TRACT | @ 15:00:00 | NDC 00173087614

## 2023-03-22 MED ADMIN — ENOXAPARIN 40 MG/0.4 ML SC SYRG [85052]: 40 mg | SUBCUTANEOUS | @ 15:00:00 | NDC 00781324602

## 2023-03-22 MED ADMIN — PSYLLIUM HUSK (ASPARTAME) 3 GRAM PO PWPK [454372]: 1 | ORAL | @ 15:00:00 | NDC 96295013533

## 2023-03-22 MED ADMIN — PHENOL 1.4 % MM SPRA [82893]: 2 | OROMUCOSAL | @ 15:00:00 | NDC 00536122858

## 2023-03-22 MED ADMIN — ACETAMINOPHEN 1,000 MG/100 ML (10 MG/ML) IV SOLN [305632]: 1000 mg | INTRAVENOUS | @ 21:00:00 | NDC 63323043441

## 2023-03-22 MED ADMIN — ACETAMINOPHEN 1,000 MG/100 ML (10 MG/ML) IV SOLN [305632]: 1000 mg | INTRAVENOUS | @ 10:00:00 | NDC 63323043441

## 2023-03-23 ENCOUNTER — Inpatient Hospital Stay: Admit: 2023-03-23 | Discharge: 2023-03-23 | Payer: MEDICARE

## 2023-03-23 MED ADMIN — METOPROLOL TARTRATE 5 MG/5 ML IV SOLN [5007]: 2.5 mg | INTRAVENOUS | @ 04:00:00 | NDC 72611074001

## 2023-03-23 MED ADMIN — METOPROLOL TARTRATE 5 MG/5 ML IV SOLN [5007]: 2.5 mg | INTRAVENOUS | @ 10:00:00 | NDC 72611074001

## 2023-03-23 MED ADMIN — ENOXAPARIN 40 MG/0.4 ML SC SYRG [85052]: 40 mg | SUBCUTANEOUS | @ 13:00:00 | NDC 00781324602

## 2023-03-23 MED ADMIN — FENTANYL CITRATE (PF) 50 MCG/ML IJ SOLN [3037]: 50 ug | INTRAVENOUS | @ 10:00:00 | NDC 00409909412

## 2023-03-23 MED ADMIN — ACETAMINOPHEN 1,000 MG/100 ML (10 MG/ML) IV SOLN [305632]: 1000 mg | INTRAVENOUS | @ 18:00:00 | NDC 00264410090

## 2023-03-23 MED ADMIN — SENNOSIDES-DOCUSATE SODIUM 8.6-50 MG PO TAB [40926]: 1 | ORAL | @ 08:00:00 | NDC 00536124810

## 2023-03-23 MED ADMIN — ACETAMINOPHEN 1,000 MG/100 ML (10 MG/ML) IV SOLN [305632]: 1000 mg | INTRAVENOUS | @ 10:00:00 | NDC 00264410090

## 2023-03-23 MED ADMIN — PSYLLIUM HUSK (ASPARTAME) 3 GRAM PO PWPK [454372]: 1 | ORAL | @ 13:00:00 | NDC 96295013533

## 2023-03-23 MED ADMIN — WATER FOR INJECTION, STERILE IJ SOLN [79513]: 10 mL | INTRAVENOUS | @ 23:00:00 | Stop: 2023-03-23 | NDC 63323018507

## 2023-03-23 MED ADMIN — ENOXAPARIN 40 MG/0.4 ML SC SYRG [85052]: 40 mg | SUBCUTANEOUS | @ 03:00:00 | NDC 00781324602

## 2023-03-23 MED ADMIN — ACETAMINOPHEN 1,000 MG/100 ML (10 MG/ML) IV SOLN [305632]: 1000 mg | INTRAVENOUS | @ 03:00:00 | NDC 00264410090

## 2023-03-23 MED ADMIN — FUROSEMIDE 10 MG/ML IJ SOLN [3291]: 20 mg | INTRAVENOUS | @ 23:00:00 | Stop: 2023-03-23 | NDC 63323028001

## 2023-03-23 MED ADMIN — TORSEMIDE 20 MG PO TAB [18293]: 20 mg | ORAL | @ 18:00:00 | Stop: 2023-03-23 | NDC 68084053911

## 2023-03-23 MED ADMIN — FENTANYL CITRATE (PF) 50 MCG/ML IJ SOLN [3037]: 50 ug | INTRAVENOUS | @ 16:00:00 | NDC 00409909412

## 2023-03-23 MED ADMIN — METOPROLOL TARTRATE 5 MG/5 ML IV SOLN [5007]: 2.5 mg | INTRAVENOUS | @ 16:00:00 | NDC 72611074001

## 2023-03-23 MED ADMIN — POLYETHYLENE GLYCOL 3350 17 GRAM PO PWPK [25424]: 34 g | ORAL | @ 13:00:00 | NDC 00904693186

## 2023-03-23 MED ADMIN — METOPROLOL TARTRATE 5 MG/5 ML IV SOLN [5007]: 2.5 mg | INTRAVENOUS | @ 23:00:00 | NDC 72611074001

## 2023-03-23 MED ADMIN — FENTANYL CITRATE (PF) 50 MCG/ML IJ SOLN [3037]: 50 ug | INTRAVENOUS | @ 08:00:00 | NDC 00409909412

## 2023-03-23 MED ADMIN — ALTEPLASE 2 MG IK SOLR [82551]: 2 mg | INTRAMUSCULAR | @ 23:00:00 | Stop: 2023-03-23 | NDC 50242004164

## 2023-03-23 MED ADMIN — FENTANYL CITRATE (PF) 50 MCG/ML IJ SOLN [3037]: 50 ug | INTRAVENOUS | @ 03:00:00 | NDC 00409909412

## 2023-03-24 MED ADMIN — FENTANYL CITRATE (PF) 50 MCG/ML IJ SOLN [3037]: 50 ug | INTRAVENOUS | @ 10:00:00 | Stop: 2023-03-24 | NDC 00409909412

## 2023-03-24 MED ADMIN — METOPROLOL TARTRATE 5 MG/5 ML IV SOLN [5007]: 2.5 mg | INTRAVENOUS | @ 23:00:00 | NDC 72611074001

## 2023-03-24 MED ADMIN — ENOXAPARIN 40 MG/0.4 ML SC SYRG [85052]: 40 mg | SUBCUTANEOUS | @ 05:00:00 | NDC 00781324602

## 2023-03-24 MED ADMIN — FUROSEMIDE 10 MG/ML IJ SOLN [3291]: 20 mg | INTRAVENOUS | @ 05:00:00 | Stop: 2023-03-24 | NDC 63323028001

## 2023-03-24 MED ADMIN — FENTANYL CITRATE (PF) 50 MCG/ML IJ SOLN [3037]: 50 ug | INTRAVENOUS | @ 07:00:00 | Stop: 2023-03-24 | NDC 00409909412

## 2023-03-24 MED ADMIN — ACETAMINOPHEN 1,000 MG/100 ML (10 MG/ML) IV SOLN [305632]: 1000 mg | INTRAVENOUS | @ 11:00:00 | Stop: 2023-03-24 | NDC 67457094010

## 2023-03-24 MED ADMIN — METOPROLOL TARTRATE 5 MG/5 ML IV SOLN [5007]: 2.5 mg | INTRAVENOUS | @ 17:00:00 | NDC 72611074001

## 2023-03-24 MED ADMIN — POLYETHYLENE GLYCOL 3350 17 GRAM PO PWPK [25424]: 17 g | ORAL | @ 17:00:00 | NDC 00904693186

## 2023-03-24 MED ADMIN — FENTANYL CITRATE (PF) 50 MCG/ML IJ SOLN [3037]: 50 ug | INTRAVENOUS | @ 05:00:00 | NDC 00409909412

## 2023-03-24 MED ADMIN — OXYCODONE 5 MG PO TAB [10814]: 5 mg | ORAL | NDC 00406055223

## 2023-03-24 MED ADMIN — PSYLLIUM HUSK (ASPARTAME) 3 GRAM PO PWPK [454372]: 1 | ORAL | @ 16:00:00 | NDC 96295013533

## 2023-03-24 MED ADMIN — OXYCODONE 5 MG PO TAB [10814]: 5 mg | ORAL | @ 16:00:00 | NDC 00406055223

## 2023-03-24 MED ADMIN — OXYCODONE 5 MG PO TAB [10814]: 5 mg | ORAL | @ 20:00:00 | NDC 00406055223

## 2023-03-24 MED ADMIN — ACETAMINOPHEN 325 MG PO TAB [101]: 650 mg | ORAL | @ 20:00:00 | NDC 00904677361

## 2023-03-24 MED ADMIN — ACETAMINOPHEN 1,000 MG/100 ML (10 MG/ML) IV SOLN [305632]: 1000 mg | INTRAVENOUS | @ 05:00:00 | NDC 00264410090

## 2023-03-24 MED ADMIN — METOPROLOL TARTRATE 5 MG/5 ML IV SOLN [5007]: 2.5 mg | INTRAVENOUS | @ 11:00:00 | NDC 72611074001

## 2023-03-24 MED ADMIN — SENNOSIDES-DOCUSATE SODIUM 8.6-50 MG PO TAB [40926]: 1 | ORAL | @ 17:00:00 | NDC 00536124810

## 2023-03-24 MED ADMIN — ENOXAPARIN 100 MG/ML SC SYRG [85051]: 100 mg | SUBCUTANEOUS | @ 16:00:00 | NDC 00781326805

## 2023-03-24 MED ADMIN — FUROSEMIDE 40 MG PO TAB [3295]: 40 mg | ORAL | @ 16:00:00 | NDC 00904717861

## 2023-03-24 MED ADMIN — METOPROLOL TARTRATE 5 MG/5 ML IV SOLN [5007]: 2.5 mg | INTRAVENOUS | @ 06:00:00 | NDC 72611074001

## 2023-03-25 ENCOUNTER — Inpatient Hospital Stay: Admit: 2023-03-25 | Discharge: 2023-03-25 | Payer: MEDICARE

## 2023-03-25 MED ADMIN — ENOXAPARIN 100 MG/ML SC SYRG [85051]: 100 mg | SUBCUTANEOUS | @ 14:00:00 | NDC 00781326805

## 2023-03-25 MED ADMIN — ACETAMINOPHEN 325 MG PO TAB [101]: 650 mg | ORAL | @ 14:00:00 | NDC 00904677361

## 2023-03-25 MED ADMIN — ACETAMINOPHEN 325 MG PO TAB [101]: 650 mg | ORAL | @ 09:00:00 | NDC 00904677361

## 2023-03-25 MED ADMIN — OXYCODONE 5 MG PO TAB [10814]: 5 mg | ORAL | @ 05:00:00 | NDC 00406055223

## 2023-03-25 MED ADMIN — ACETAMINOPHEN 325 MG PO TAB [101]: 650 mg | ORAL | @ 01:00:00 | NDC 00904677361

## 2023-03-25 MED ADMIN — MELATONIN 5 MG PO TAB [168576]: 5 mg | ORAL | @ 05:00:00 | NDC 20555003901

## 2023-03-25 MED ADMIN — DICLOFENAC SODIUM 1 % TP GEL [168032]: 2 g | TOPICAL | @ 22:00:00 | NDC 70000055502

## 2023-03-25 MED ADMIN — METOPROLOL TARTRATE 5 MG/5 ML IV SOLN [5007]: 2.5 mg | INTRAVENOUS | @ 05:00:00 | Stop: 2023-03-25 | NDC 72611074001

## 2023-03-25 MED ADMIN — ACETAMINOPHEN 325 MG PO TAB [101]: 650 mg | ORAL | @ 19:00:00 | NDC 00904677361

## 2023-03-25 MED ADMIN — OXYCODONE 5 MG PO TAB [10814]: 5 mg | ORAL | @ 19:00:00 | NDC 00406055223

## 2023-03-25 MED ADMIN — OXYCODONE 5 MG PO TAB [10814]: 5 mg | ORAL | @ 09:00:00 | NDC 00406055223

## 2023-03-25 MED ADMIN — PSYLLIUM HUSK (ASPARTAME) 3 GRAM PO PWPK [454372]: 1 | ORAL | @ 14:00:00 | NDC 96295013533

## 2023-03-25 MED ADMIN — FUROSEMIDE 40 MG PO TAB [3295]: 40 mg | ORAL | @ 14:00:00 | NDC 00904717861

## 2023-03-25 MED ADMIN — ENOXAPARIN 100 MG/ML SC SYRG [85051]: 100 mg | SUBCUTANEOUS | @ 01:00:00 | NDC 00781326805

## 2023-03-25 MED ADMIN — METOPROLOL TARTRATE 5 MG/5 ML IV SOLN [5007]: 2.5 mg | INTRAVENOUS | @ 11:00:00 | Stop: 2023-03-25 | NDC 72611074001

## 2023-03-26 ENCOUNTER — Encounter: Admit: 2023-03-26 | Discharge: 2023-03-26 | Payer: MEDICARE

## 2023-03-26 MED ADMIN — PSYLLIUM HUSK (ASPARTAME) 3 GRAM PO PWPK [454372]: 1 | ORAL | @ 13:00:00 | NDC 96295013533

## 2023-03-26 MED ADMIN — OXYCODONE 5 MG PO TAB [10814]: 5 mg | ORAL | @ 23:00:00 | NDC 00406055223

## 2023-03-26 MED ADMIN — OXYCODONE 5 MG PO TAB [10814]: 5 mg | ORAL | @ 08:00:00 | NDC 00406055223

## 2023-03-26 MED ADMIN — METOPROLOL TARTRATE 25 MG PO TAB [37637]: 12.5 mg | ORAL | @ 02:00:00 | NDC 62584026511

## 2023-03-26 MED ADMIN — HYDROXYZINE HCL 25 MG PO TAB [3774]: 25 mg | ORAL | @ 15:00:00 | NDC 00904661761

## 2023-03-26 MED ADMIN — ATORVASTATIN 40 MG PO TAB [77113]: 40 mg | ORAL | @ 01:00:00 | NDC 00904629261

## 2023-03-26 MED ADMIN — ENOXAPARIN 100 MG/ML SC SYRG [85051]: 100 mg | SUBCUTANEOUS | @ 14:00:00 | NDC 00781326805

## 2023-03-26 MED ADMIN — OXYCODONE 5 MG PO TAB [10814]: 5 mg | ORAL | @ 12:00:00 | NDC 00406055223

## 2023-03-26 MED ADMIN — FUROSEMIDE 40 MG PO TAB [3295]: 40 mg | ORAL | @ 13:00:00 | NDC 00904717861

## 2023-03-26 MED ADMIN — ACETAMINOPHEN 325 MG PO TAB [101]: 650 mg | ORAL | @ 08:00:00 | NDC 00904677361

## 2023-03-26 MED ADMIN — ENOXAPARIN 100 MG/ML SC SYRG [85051]: 100 mg | SUBCUTANEOUS | @ 01:00:00 | NDC 00781326805

## 2023-03-26 MED ADMIN — LIDOCAINE 5 % TP PTMD [80759]: 2 | TOPICAL | @ 13:00:00 | NDC 00591352511

## 2023-03-26 MED ADMIN — ACETAMINOPHEN 325 MG PO TAB [101]: 650 mg | ORAL | @ 20:00:00 | NDC 00904677361

## 2023-03-26 MED ADMIN — METOPROLOL TARTRATE 25 MG PO TAB [37637]: 12.5 mg | ORAL | @ 13:00:00 | NDC 62584026511

## 2023-03-26 MED ADMIN — ACETAMINOPHEN 325 MG PO TAB [101]: 650 mg | ORAL | @ 13:00:00 | NDC 00904677361

## 2023-03-26 MED ADMIN — ACETAMINOPHEN 325 MG PO TAB [101]: 650 mg | ORAL | @ 01:00:00 | NDC 00904677361

## 2023-03-26 MED ADMIN — OXYCODONE 5 MG PO TAB [10814]: 5 mg | ORAL | @ 01:00:00 | NDC 00406055223

## 2023-03-26 MED ADMIN — OXYCODONE 5 MG PO TAB [10814]: 5 mg | ORAL | @ 16:00:00 | NDC 00406055223

## 2023-03-26 MED ADMIN — MELATONIN 5 MG PO TAB [168576]: 5 mg | ORAL | @ 01:00:00 | NDC 20555003901

## 2023-03-27 MED ADMIN — ENOXAPARIN 100 MG/ML SC SYRG [85051]: 100 mg | SUBCUTANEOUS | @ 13:00:00 | Stop: 2023-03-27 | NDC 00781326805

## 2023-03-27 MED ADMIN — OXYCODONE 5 MG PO TAB [10814]: 5 mg | ORAL | @ 19:00:00 | Stop: 2023-03-27 | NDC 00406055223

## 2023-03-27 MED ADMIN — OXYCODONE 5 MG PO TAB [10814]: 5 mg | ORAL | @ 14:00:00 | Stop: 2023-03-27 | NDC 00406055223

## 2023-03-27 MED ADMIN — ACETAMINOPHEN 325 MG PO TAB [101]: 650 mg | ORAL | @ 13:00:00 | Stop: 2023-03-27 | NDC 00904677361

## 2023-03-27 MED ADMIN — METOPROLOL TARTRATE 25 MG PO TAB [37637]: 12.5 mg | ORAL | @ 13:00:00 | Stop: 2023-03-27 | NDC 62584026511

## 2023-03-27 MED ADMIN — ACETAMINOPHEN 325 MG PO TAB [101]: 650 mg | ORAL | @ 19:00:00 | Stop: 2023-03-27 | NDC 00904677361

## 2023-03-27 MED ADMIN — PSYLLIUM HUSK (ASPARTAME) 3 GRAM PO PWPK [454372]: 1 | ORAL | @ 13:00:00 | Stop: 2023-03-27 | NDC 96295013533

## 2023-03-27 MED ADMIN — MELATONIN 5 MG PO TAB [168576]: 5 mg | ORAL | @ 02:00:00 | NDC 20555003901

## 2023-03-27 MED ADMIN — ATORVASTATIN 40 MG PO TAB [77113]: 40 mg | ORAL | @ 02:00:00 | NDC 00904629261

## 2023-03-27 MED ADMIN — ACETAMINOPHEN 325 MG PO TAB [101]: 650 mg | ORAL | @ 02:00:00 | NDC 00904677361

## 2023-03-27 MED ADMIN — OXYCODONE 5 MG PO TAB [10814]: 5 mg | ORAL | @ 09:00:00 | Stop: 2023-03-27 | NDC 00406055223

## 2023-03-27 MED ADMIN — HYDROXYZINE HCL 25 MG PO TAB [3774]: 25 mg | ORAL | @ 19:00:00 | Stop: 2023-03-27 | NDC 00904661761

## 2023-03-27 MED ADMIN — OXYCODONE 5 MG PO TAB [10814]: 5 mg | ORAL | @ 05:00:00 | NDC 00406055223

## 2023-03-27 MED ADMIN — HYDROXYZINE HCL 25 MG PO TAB [3774]: 25 mg | ORAL | @ 14:00:00 | Stop: 2023-03-27 | NDC 00904661761

## 2023-03-27 MED ADMIN — HYDROXYZINE HCL 25 MG PO TAB [3774]: 25 mg | ORAL | @ 02:00:00 | NDC 00904661761

## 2023-03-27 MED ADMIN — ENOXAPARIN 100 MG/ML SC SYRG [85051]: 100 mg | SUBCUTANEOUS | @ 02:00:00 | NDC 00781326805

## 2023-03-27 MED ADMIN — FUROSEMIDE 40 MG PO TAB [3295]: 40 mg | ORAL | @ 13:00:00 | Stop: 2023-03-27 | NDC 00904717861

## 2023-03-27 MED ADMIN — ACETAMINOPHEN 325 MG PO TAB [101]: 650 mg | ORAL | @ 09:00:00 | Stop: 2023-03-27 | NDC 00904677361

## 2023-03-27 MED ADMIN — METOPROLOL TARTRATE 25 MG PO TAB [37637]: 12.5 mg | ORAL | @ 02:00:00 | NDC 62584026511

## 2023-03-27 MED ADMIN — LIDOCAINE 5 % TP PTMD [80759]: 2 | TOPICAL | @ 13:00:00 | Stop: 2023-03-27 | NDC 00591352511

## 2023-03-28 ENCOUNTER — Encounter: Admit: 2023-03-28 | Discharge: 2023-03-28 | Payer: MEDICARE

## 2023-03-30 ENCOUNTER — Ambulatory Visit: Admit: 2023-03-30 | Discharge: 2023-03-31 | Payer: MEDICARE

## 2023-03-30 ENCOUNTER — Encounter: Admit: 2023-03-30 | Discharge: 2023-03-30 | Payer: MEDICARE

## 2023-03-30 DIAGNOSIS — Z79899 Other long term (current) drug therapy: Secondary | ICD-10-CM

## 2023-03-30 DIAGNOSIS — E669 Obesity, unspecified: Secondary | ICD-10-CM

## 2023-03-30 DIAGNOSIS — I2510 Atherosclerotic heart disease of native coronary artery without angina pectoris: Secondary | ICD-10-CM

## 2023-03-30 DIAGNOSIS — I10 Essential (primary) hypertension: Secondary | ICD-10-CM

## 2023-03-30 DIAGNOSIS — R413 Other amnesia: Secondary | ICD-10-CM

## 2023-03-30 DIAGNOSIS — I5033 Acute on chronic diastolic (congestive) heart failure: Secondary | ICD-10-CM

## 2023-03-30 DIAGNOSIS — M1990 Unspecified osteoarthritis, unspecified site: Secondary | ICD-10-CM

## 2023-03-30 DIAGNOSIS — J45909 Unspecified asthma, uncomplicated: Secondary | ICD-10-CM

## 2023-03-30 DIAGNOSIS — K439 Ventral hernia without obstruction or gangrene: Secondary | ICD-10-CM

## 2023-03-30 DIAGNOSIS — E119 Type 2 diabetes mellitus without complications: Secondary | ICD-10-CM

## 2023-03-30 DIAGNOSIS — Z9889 Other specified postprocedural states: Secondary | ICD-10-CM

## 2023-03-30 DIAGNOSIS — E785 Hyperlipidemia, unspecified: Secondary | ICD-10-CM

## 2023-03-30 DIAGNOSIS — M549 Dorsalgia, unspecified: Secondary | ICD-10-CM

## 2023-03-30 DIAGNOSIS — H547 Unspecified visual loss: Secondary | ICD-10-CM

## 2023-03-30 DIAGNOSIS — J439 Emphysema, unspecified: Secondary | ICD-10-CM

## 2023-03-30 DIAGNOSIS — K56609 Unspecified intestinal obstruction, unspecified as to partial versus complete obstruction: Secondary | ICD-10-CM

## 2023-03-30 NOTE — Progress Notes
Patient is presently in a nursing home, this was a telehealth visit.        Date of Service: 03/30/2023    Amy Bowman is a 82 y.o. female.       HPI     Amy Bowman is a 82 y.o. 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    Patient was admitted that [facility] between 03/21/2023 - 03/27/2023, she did suffer a small bowel obstruction.  Patient underwent ventral hernia repair on 12/13/2022, this was followed by several episodes of SBO, she underwent exploratory laparotomy, she also suffered dehiscence and she underwent a reexploration on 03/03/2023.  Patient was discharged to an SNF, she sustained a fall and she was readmitted with concerns for SBO.    Patient was stabilized and discharged back to the [facility] in [place], [place].  She was also discharged with 90 days recommendation of therapeutic enoxaparin for a provoked RUE DVT.    Patient was seen today through a telehealth visit, her daughter was with her.    It was difficult to assess through the video.  Lower extremity edema and also RUE edema was reported.    Patient is on a complex list of medications.  During this video visit she appeared to be somnolent, she stated that she was tired and very fatigued.  Patient also stated that she thinks she is getting better and following the physical therapy protocol.           Vitals:    03/30/23 0936   BP: 94/45   BP Source: Arm, Left Upper   Pulse: 97   SpO2: 92%  Comment: on 3L continuous   O2 Device: Nasal cannula   O2 Liter Flow: 3 Lpm   PainSc: Six   Weight: 101.6 kg (224 lb)   Height: 149.9 cm (4' 11)     Body mass index is 45.24 kg/m?Marland Kitchen     Past Medical History  Patient Active Problem List    Diagnosis Date Noted    Moderate malnutrition (HCC) 03/12/2023    Dehiscence of closure of fascia, superficial or muscular, subsequent encounter 03/07/2023    SBO (small bowel obstruction) (HCC) 01/28/2023    S/P hernia surgery 01/11/2023    Non-ST elevation MI (NSTEMI) (HCC) 01/11/2023    Small bowel obstruction (HCC) 01/11/2023    Polypharmacy 01/11/2023    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 [facility]) 05/26/2022    Acute respiratory failure with hypoxia (HCC) 05/25/2022    COVID-19 vaccine administered 03/09/2020    Preop cardiovascular exam 03/09/2020    Able to mobilize using indoor motorized wheelchair 03/09/2020    On home oxygen therapy 03/09/2020    Presence of drug coated stent in left circumflex coronary artery 03/13/2019  Able to mobilize using indoor motorized wheelchair 03/13/2019    Hospital discharge follow-up 03/13/2019    Coronary artery disease due to lipid rich plaque 01/29/2019    Abnormal thallium stress test 01/16/2019    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/16/2015    Preop cardiovascular exam 04/22/2015    Preoperative cardiovascular examination 11/26/2014    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  Atretic left internal mammary artery to the diagonal graft, 100% occluded vein graft to the left posterior descending artery.  Patent vein graft to obtuse marginal 1.  Successful percutaneous coronary intervention of the native mid to distal left circumflex with Xience 3.0 x 28 mm and Xience Sierra 2.5 x 38 mm drug-eluting stent placed in an overlapping fashion, postdilated with 3.0 mm noncompliant balloon.  High normal left ventricular end-diastolic pressure.      Type II diabetes mellitus (HCC)     HLD (hyperlipidemia)     HTN (hypertension)     Near syncope      10/16/2013-[facility] Carotid Artery US-Impression:  Some minimal atherosclerotic plaque evident bilaterally in the carotid bulbs and origin of the internal carotid arteries that does not appear to be hemodynamically significant at this time with findings consistent with less than a 50% diameter stenosis left and right.           Review of Systems   Constitutional: Negative.   HENT: Negative.     Eyes: Negative.    Cardiovascular: Negative.    Respiratory: Negative.     Endocrine: Negative.    Hematologic/Lymphatic: Negative.    Skin: Negative.    Musculoskeletal: Negative.    Gastrointestinal: Negative.    Genitourinary: Negative.    Neurological: Negative.    Psychiatric/Behavioral: Negative.     Allergic/Immunologic: Negative.      Physical Exam --physical exam was not performed, this was a telehealth visit      Cardiovascular Studies      Cardiovascular Health Factors  Vitals BP Readings from Last 3 Encounters:   03/30/23 94/45   03/27/23 119/66   03/16/23 107/58     Wt Readings from Last 3 Encounters:   03/30/23 101.6 kg (224 lb)   03/21/23 101.5 kg (223 lb 12.3 oz)   03/11/23 103 kg (227 lb 1.2 oz)     BMI Readings from Last 3 Encounters:   03/30/23 45.24 kg/m?   03/21/23 45.20 kg/m?   03/11/23 45.86 kg/m?      Smoking Social History     Tobacco Use   Smoking Status Former    Current packs/day: 0.00    Average packs/day: 3.0 packs/day for 40.0 years (120.0 ttl pk-yrs)    Types: Cigarettes    Start date: 07/30/1958    Quit date: 07/30/1998    Years since quitting: 24.6   Smokeless Tobacco Former    Types: Sports administrator  Quit date: 07/02/1997   Tobacco Comments    Quit 1999      Lipid Profile Cholesterol   Date Value Ref Range Status   01/19/2022 131  Final     HDL   Date Value Ref Range Status   01/19/2022 53  Final     LDL   Date Value Ref Range Status   01/19/2022 60  Final     Triglycerides   Date Value Ref Range Status   03/04/2023 68 <150 MG/DL Final      Blood Sugar Hemoglobin A1C   Date Value Ref Range Status   05/27/2022 6.5 (H) 4.0 - 5.7 % Final     Comment:     The ADA recommends that most patients with type 1 and type 2 diabetes maintain   an A1c level <7%.       Glucose   Date Value Ref Range Status   03/24/2023 101 (H) 70 - 100 MG/DL Final   16/05/9603 540 (H) 70 - 100 MG/DL Final   98/06/9146 829 (H) 70 - 100 MG/DL Final     Glucose, POC   Date Value Ref Range Status   03/21/2023 132 (H) 70 - 100 MG/DL Final   56/21/3086 578 (H) 70 - 100 MG/DL Final   46/96/2952 76 70 - 100 MG/DL Final          Problems Addressed Today  No diagnosis found.    Assessment and Plan      Assessment:     1.  Chronic hypoxic respiratory failure oxygen dependent, currently 2-3 L/min, previously patient was using 6 L/min  She has undergone formal pulmonary evaluation  The lung pathology is complex and includes advanced, stage III COPD, lung nodules, restrictive lung disease, pulmonary fibrosis  2.  Hospital discharge follow-up  Patient was admitted at [facility] in April 2024 with an incarcerated ventral hernia and small bowel obstruction, she underwent emergent surgical intervention  3.  Chronic HFpEF - managed with diuretics, patient is unsure whether she takes furosemide or torsemide  A 2D echo Doppler study performed by [facility] on 12/06/2022-normal LVEF = 60%, no segmental WMA, no significant outflow gradient across the LVOT, concentric LVH, no significant valvular abnormalities.  4.  Coronary artery disease, status post CABG 1999  LIMA to LAD, SVG to OM branch, SVG to PDA  5.  Status post LHC in June 2020-atretic LIMA to diagonal graft, 100% occluded SVG to PDA, patent SVG to OM branch  6.  Status post PCI to native mid/distal left circumflex with a DES on 01/29/2019  7.  Morbid obesity, motorized wheelchair dependency, elevated BMI = 41.01 kg/m?  It is notable that patient did have a weight loss of 38 pounds since February 2023  8.  Relative hypotension  Patient is on losartan for a history of hypertension  9. Multiple orthopedic issues status post left knee surgery complicated by hematoma that required surgical evacuation  10.  Osteoarthritis  Status post left hip surgery in January 2023 complicated by hematoma that required revision in April 2023  Currently, patient is unable to lift her arms very likely due to bilateral shoulder arthritis  11.  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   Status post hospitalization at [facility] was treated for SBO      Plan:    1.  I suggested to check with the nursing home physician and Dr. Alona Bene (PCP) to  adjust the diuretic dose in the hope to reduce the lower extremity edema.  2.  Continue therapeutic dose of enoxaparin, patient will very likely need a reassessment imaging study of the RUE and then it will be decided whether she will have to continue the anticoagulation  3.  No further cardiac recommendations at this point in time, I suggest follow-up in approximately 6 to 8 months.         Current Medications (including today's revisions)   acetaminophen (TYLENOL) 325 mg tablet Take two tablets by mouth every 6 hours as needed for Pain for up to 30 days. Indications: pain    albuterol sulfate (PROAIR HFA) 90 mcg/actuation HFA aerosol inhaler Inhale two puffs by mouth into the lungs every 6 hours as needed for Wheezing or Shortness of Breath. Indications: asthma attack    albuterol-ipratropium (DUONEB) 0.5 mg-3 mg(2.5 mg base)/3 mL nebulizer solution Inhale 3 mL solution by nebulizer as directed twice daily as needed for Wheezing. Indications: bronchi muscle spasm resulting from COPD    aspirin EC (ASPIR-LOW) 81 mg tablet Take one tablet by mouth daily.    atorvastatin (LIPITOR) 40 mg tablet TAKE ONE TABLET BY MOUTH DAILY. INDICATIONS: HIGH CHOLESTEROL AND HIGH TRIGLYCERIDES (Patient taking differently: Take one tablet by mouth at bedtime daily.)    dexlansoprazole (DEXILANT) 30 mg capsule Take one capsule by mouth twice daily before meals.    diclofenac sodium (VOLTAREN) 1 % topical gel Apply two g topically to affected area daily as needed.    docusate (COLACE) 100 mg capsule Take one capsule by mouth as Needed. Indications: constipation (Patient taking differently: Take one capsule by mouth every 24 hours as needed. Indications: constipation)    enoxaparin (LOVENOX) 100 mg syringe Inject 1 mL under the skin twice daily for 90 days. Indications: blood clot in a deep vein of the extremities    ferrous sulfate (FEOSOL) 325 mg (65 mg iron) tablet Take one tablet by mouth daily. Take on an empty stomach at least 1 hour before or 2 hours after food.    fluticasone (FLONASE) 50 mcg/actuation nasal spray Apply one spray to each nostril as directed at bedtime daily.    fluticasone furoate (ARNUITY ELLIPTA) 200 mcg/actuation disk inhaler Inhale one puff by mouth into the lungs daily.    gabapentin (NEURONTIN) 300 mg capsule Take one capsule by mouth twice daily for 30 days. Indications: neuropathic pain    hydrOXYzine HCL (ATARAX) 25 mg tablet Take one tablet by mouth three times daily as needed. Indications: anxious    lidocaine (LIDODERM) 5 % topical patch Apply one patch topically to affected area daily. Apply patch to Right shoulder for 12 hours, then remove for 12 hours before repeating.    losartan (COZAAR) 25 mg tablet Take one tablet by mouth daily.    magnesium oxide (MAGOX) 400 mg (241.3 mg magnesium) tablet Take one tablet by mouth daily.    melatonin 5 mg tablet Take one tablet by mouth at bedtime as needed. Indications: difficulty sleeping    metFORMIN-XR (GLUCOPHAGE XR) 500 mg extended release tablet Take one tablet by mouth twice daily with meals.    methocarbamoL (ROBAXIN) 750 mg tablet Take one tablet by mouth twice daily. Indications: muscle spasm    metoprolol succinate XL (TOPROL XL) 25 mg extended release tablet Take one tablet by mouth daily.    nystatin (NYSTOP) 100,000 unit/g topical powder Apply  topically to affected area four times daily.    ondansetron (ZOFRAN ODT) 4 mg  rapid dissolve tablet Dissolve one tablet by mouth every 6 hours as needed. Place on tongue to dissolve.  Indications: prevent nausea and vomiting after surgery    oxyCODONE (ROXICODONE) 5 mg tablet Take one tablet by mouth every 6 hours as needed for Pain for up to 7 days. Indications: pain    polyethylene glycol 3350 (MIRALAX) 17 g packet Take one packet by mouth daily. While taking oxycodone.  Indications: constipation    sennosides-docusate sodium (SENOKOT-S) 8.6/50 mg tablet Take one tablet by mouth daily as needed (Constipation - First Line) for up to 30 days. Indications: constipation    torsemide (DEMADEX) 20 mg tablet Take one tablet by mouth daily.    umeclidinium-vilanteroL (ANORO ELLIPTA) 62.5-25 mcg/actuation inhaler Inhale one puff by mouth into the lungs daily.    zafirlukast(+) (ACCOLATE) 20 mg PO tablet Take one tablet by mouth twice daily.

## 2023-04-02 ENCOUNTER — Encounter: Admit: 2023-04-02 | Discharge: 2023-04-02 | Payer: MEDICARE

## 2023-04-02 NOTE — Telephone Encounter
04/02/2023 8:46 AM   Called patient's husband to notify him that it was decided for patient to be seen in Feb 2025, per Dr. Letitia Neri telehealth visit on 8/9. Spoke with Husband with permission to do so. He verbalized understanding, no further questions at this time. Scheduling telephone number provided. Explained that he needs to call this week to ensure she gets an appt. Patient currently in SNF at this time. Explained this needs to be in a in person OV.

## 2023-04-03 ENCOUNTER — Encounter: Admit: 2023-04-03 | Discharge: 2023-04-03 | Payer: MEDICARE

## 2023-04-03 NOTE — Progress Notes
CT chest dated 03/02/2023 sent to PCP on file. Report recommends a bronchoscopy.

## 2023-04-07 ENCOUNTER — Encounter: Admit: 2023-04-07 | Discharge: 2023-04-07 | Payer: MEDICARE

## 2023-04-08 ENCOUNTER — Encounter: Admit: 2023-04-08 | Discharge: 2023-04-08 | Payer: MEDICARE

## 2023-04-10 ENCOUNTER — Encounter: Admit: 2023-04-10 | Discharge: 2023-04-10 | Payer: MEDICARE

## 2023-04-19 ENCOUNTER — Encounter: Admit: 2023-04-19 | Discharge: 2023-04-19 | Payer: MEDICARE

## 2023-05-22 DEATH — deceased

## 2023-07-17 ENCOUNTER — Encounter: Admit: 2023-07-17 | Discharge: 2023-07-17 | Payer: MEDICARE

## 2023-09-27 ENCOUNTER — Encounter: Admit: 2023-09-27 | Discharge: 2023-09-27 | Payer: MEDICARE

## 2023-10-11 ENCOUNTER — Encounter: Admit: 2023-10-11 | Discharge: 2023-10-11 | Payer: MEDICARE
